# Patient Record
Sex: Female | Born: 1970
Health system: Southern US, Community
[De-identification: ages and names within clinical notes are randomized; demographics above are authoritative.]

## PROBLEM LIST (undated history)

## (undated) DIAGNOSIS — F319 Bipolar disorder, unspecified: Secondary | ICD-10-CM

## (undated) DIAGNOSIS — R112 Nausea with vomiting, unspecified: Secondary | ICD-10-CM

## (undated) DIAGNOSIS — E039 Hypothyroidism, unspecified: Secondary | ICD-10-CM

## (undated) DIAGNOSIS — J069 Acute upper respiratory infection, unspecified: Secondary | ICD-10-CM

## (undated) DIAGNOSIS — Z9889 Other specified postprocedural states: Secondary | ICD-10-CM

## (undated) DIAGNOSIS — F329 Major depressive disorder, single episode, unspecified: Secondary | ICD-10-CM

## (undated) DIAGNOSIS — Z5189 Encounter for other specified aftercare: Secondary | ICD-10-CM

## (undated) DIAGNOSIS — IMO0001 Reserved for inherently not codable concepts without codable children: Secondary | ICD-10-CM

## (undated) DIAGNOSIS — I10 Essential (primary) hypertension: Secondary | ICD-10-CM

## (undated) DIAGNOSIS — F419 Anxiety disorder, unspecified: Secondary | ICD-10-CM

## (undated) DIAGNOSIS — F32A Depression, unspecified: Secondary | ICD-10-CM

## (undated) DIAGNOSIS — T7840XA Allergy, unspecified, initial encounter: Secondary | ICD-10-CM

## (undated) DIAGNOSIS — L709 Acne, unspecified: Secondary | ICD-10-CM

## (undated) DIAGNOSIS — H02401 Unspecified ptosis of right eyelid: Secondary | ICD-10-CM

## (undated) DIAGNOSIS — E669 Obesity, unspecified: Secondary | ICD-10-CM

## (undated) DIAGNOSIS — F99 Mental disorder, not otherwise specified: Secondary | ICD-10-CM

## (undated) HISTORY — DX: Bipolar disorder, unspecified: F31.9

## (undated) HISTORY — DX: Unspecified ptosis of right eyelid: H02.401

## (undated) HISTORY — PX: FOOT FOREIGN BODY REMOVAL: SUR1116

## (undated) HISTORY — PX: THYROIDECTOMY, PARTIAL: SHX18

## (undated) HISTORY — DX: Reserved for inherently not codable concepts without codable children: IMO0001

## (undated) HISTORY — PX: KNEE ARTHROSCOPY: SUR90

## (undated) HISTORY — DX: Acne, unspecified: L70.9

## (undated) HISTORY — DX: Allergy, unspecified, initial encounter: T78.40XA

## (undated) HISTORY — PX: DIAGNOSTIC LAPAROSCOPY: SUR761

## (undated) HISTORY — DX: Hypothyroidism, unspecified: E03.9

## (undated) HISTORY — DX: Obesity, unspecified: E66.9

---
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
## 1993-01-07 DIAGNOSIS — J329 Chronic sinusitis, unspecified: Secondary | ICD-10-CM | POA: Insufficient documentation

## 1996-05-08 DIAGNOSIS — K589 Irritable bowel syndrome without diarrhea: Secondary | ICD-10-CM | POA: Insufficient documentation

## 1997-04-21 DIAGNOSIS — M549 Dorsalgia, unspecified: Secondary | ICD-10-CM | POA: Insufficient documentation

## 1997-06-09 ENCOUNTER — Other Ambulatory Visit: Admission: RE | Admit: 1997-06-09 | Discharge: 1997-06-09 | Payer: Self-pay | Admitting: Gynecology

## 1998-02-26 ENCOUNTER — Other Ambulatory Visit: Admission: RE | Admit: 1998-02-26 | Discharge: 1998-02-26 | Payer: Self-pay | Admitting: Gynecology

## 1998-03-20 ENCOUNTER — Emergency Department (HOSPITAL_COMMUNITY): Admission: EM | Admit: 1998-03-20 | Discharge: 1998-03-20 | Payer: Self-pay | Admitting: Emergency Medicine

## 1998-03-20 ENCOUNTER — Encounter: Payer: Self-pay | Admitting: Emergency Medicine

## 1998-11-19 ENCOUNTER — Encounter: Admission: RE | Admit: 1998-11-19 | Discharge: 1998-11-19 | Payer: Self-pay | Admitting: Family Medicine

## 1998-12-07 ENCOUNTER — Other Ambulatory Visit: Admission: RE | Admit: 1998-12-07 | Discharge: 1998-12-07 | Payer: Self-pay | Admitting: *Deleted

## 1998-12-09 ENCOUNTER — Inpatient Hospital Stay (HOSPITAL_COMMUNITY): Admission: AD | Admit: 1998-12-09 | Discharge: 1998-12-11 | Payer: Self-pay | Admitting: Psychiatry

## 1999-08-03 ENCOUNTER — Encounter (INDEPENDENT_AMBULATORY_CARE_PROVIDER_SITE_OTHER): Payer: Self-pay | Admitting: Specialist

## 1999-08-03 ENCOUNTER — Ambulatory Visit (HOSPITAL_COMMUNITY): Admission: RE | Admit: 1999-08-03 | Discharge: 1999-08-03 | Payer: Self-pay | Admitting: General Surgery

## 1999-09-07 ENCOUNTER — Emergency Department (HOSPITAL_COMMUNITY): Admission: EM | Admit: 1999-09-07 | Discharge: 1999-09-08 | Payer: Self-pay | Admitting: Emergency Medicine

## 1999-09-10 ENCOUNTER — Ambulatory Visit (HOSPITAL_COMMUNITY): Admission: RE | Admit: 1999-09-10 | Discharge: 1999-09-10 | Payer: Self-pay | Admitting: *Deleted

## 1999-09-10 ENCOUNTER — Encounter: Payer: Self-pay | Admitting: *Deleted

## 2000-09-12 ENCOUNTER — Other Ambulatory Visit: Admission: RE | Admit: 2000-09-12 | Discharge: 2000-09-12 | Payer: Self-pay | Admitting: Gynecology

## 2001-01-15 ENCOUNTER — Other Ambulatory Visit: Admission: RE | Admit: 2001-01-15 | Discharge: 2001-01-15 | Payer: Self-pay | Admitting: Gynecology

## 2001-08-14 ENCOUNTER — Other Ambulatory Visit: Admission: RE | Admit: 2001-08-14 | Discharge: 2001-08-14 | Payer: Self-pay | Admitting: Gynecology

## 2002-09-17 ENCOUNTER — Other Ambulatory Visit: Admission: RE | Admit: 2002-09-17 | Discharge: 2002-09-17 | Payer: Self-pay | Admitting: Gynecology

## 2004-03-31 ENCOUNTER — Other Ambulatory Visit: Admission: RE | Admit: 2004-03-31 | Discharge: 2004-03-31 | Payer: Self-pay | Admitting: Obstetrics & Gynecology

## 2004-08-26 ENCOUNTER — Inpatient Hospital Stay (HOSPITAL_COMMUNITY): Admission: RE | Admit: 2004-08-26 | Discharge: 2004-08-26 | Payer: Self-pay | Admitting: Obstetrics & Gynecology

## 2004-11-10 ENCOUNTER — Inpatient Hospital Stay (HOSPITAL_COMMUNITY): Admission: AD | Admit: 2004-11-10 | Discharge: 2004-11-12 | Payer: Self-pay | Admitting: Obstetrics and Gynecology

## 2005-02-11 ENCOUNTER — Encounter: Admission: RE | Admit: 2005-02-11 | Discharge: 2005-02-11 | Payer: Self-pay | Admitting: Family Medicine

## 2006-02-16 ENCOUNTER — Ambulatory Visit: Payer: Self-pay | Admitting: Family Medicine

## 2006-04-06 ENCOUNTER — Ambulatory Visit: Payer: Self-pay | Admitting: Family Medicine

## 2006-06-29 ENCOUNTER — Inpatient Hospital Stay (HOSPITAL_COMMUNITY): Admission: AD | Admit: 2006-06-29 | Discharge: 2006-06-29 | Payer: Self-pay | Admitting: Obstetrics and Gynecology

## 2006-08-15 ENCOUNTER — Inpatient Hospital Stay (HOSPITAL_COMMUNITY): Admission: AD | Admit: 2006-08-15 | Discharge: 2006-08-15 | Payer: Self-pay | Admitting: Obstetrics and Gynecology

## 2006-09-04 ENCOUNTER — Inpatient Hospital Stay (HOSPITAL_COMMUNITY): Admission: AD | Admit: 2006-09-04 | Discharge: 2006-09-05 | Payer: Self-pay | Admitting: Obstetrics and Gynecology

## 2006-09-07 ENCOUNTER — Inpatient Hospital Stay (HOSPITAL_COMMUNITY): Admission: AD | Admit: 2006-09-07 | Discharge: 2006-09-07 | Payer: Self-pay | Admitting: *Deleted

## 2006-09-08 ENCOUNTER — Inpatient Hospital Stay (HOSPITAL_COMMUNITY): Admission: AD | Admit: 2006-09-08 | Discharge: 2006-09-08 | Payer: Self-pay | Admitting: Obstetrics and Gynecology

## 2006-10-28 ENCOUNTER — Encounter (INDEPENDENT_AMBULATORY_CARE_PROVIDER_SITE_OTHER): Payer: Self-pay | Admitting: Obstetrics and Gynecology

## 2006-10-28 ENCOUNTER — Inpatient Hospital Stay (HOSPITAL_COMMUNITY): Admission: AD | Admit: 2006-10-28 | Discharge: 2006-10-30 | Payer: Self-pay | Admitting: Obstetrics and Gynecology

## 2006-11-01 ENCOUNTER — Ambulatory Visit: Payer: Self-pay | Admitting: Family Medicine

## 2006-11-27 ENCOUNTER — Ambulatory Visit: Payer: Self-pay | Admitting: Family Medicine

## 2007-02-16 ENCOUNTER — Ambulatory Visit: Payer: Self-pay | Admitting: Family Medicine

## 2007-02-21 ENCOUNTER — Ambulatory Visit: Payer: Self-pay | Admitting: Family Medicine

## 2007-03-27 ENCOUNTER — Ambulatory Visit: Payer: Self-pay | Admitting: Family Medicine

## 2007-03-28 ENCOUNTER — Ambulatory Visit: Payer: Self-pay | Admitting: Family Medicine

## 2008-01-24 ENCOUNTER — Ambulatory Visit: Payer: Self-pay | Admitting: Family Medicine

## 2008-03-05 ENCOUNTER — Ambulatory Visit: Payer: Self-pay | Admitting: Family Medicine

## 2008-03-17 ENCOUNTER — Ambulatory Visit: Payer: Self-pay | Admitting: Family Medicine

## 2008-08-21 ENCOUNTER — Ambulatory Visit: Payer: Self-pay | Admitting: Family Medicine

## 2008-11-19 ENCOUNTER — Ambulatory Visit: Payer: Self-pay | Admitting: Family Medicine

## 2008-11-19 ENCOUNTER — Observation Stay (HOSPITAL_COMMUNITY): Admission: EM | Admit: 2008-11-19 | Discharge: 2008-11-19 | Payer: Self-pay | Admitting: Emergency Medicine

## 2008-12-23 ENCOUNTER — Ambulatory Visit: Payer: Self-pay | Admitting: Family Medicine

## 2009-05-04 ENCOUNTER — Ambulatory Visit: Payer: Self-pay | Admitting: Family Medicine

## 2009-06-22 ENCOUNTER — Ambulatory Visit: Payer: Self-pay | Admitting: Family Medicine

## 2009-09-24 ENCOUNTER — Ambulatory Visit: Payer: Self-pay | Admitting: Family Medicine

## 2009-11-18 ENCOUNTER — Ambulatory Visit: Payer: Self-pay | Admitting: Family Medicine

## 2009-11-25 ENCOUNTER — Ambulatory Visit: Payer: Self-pay | Admitting: Family Medicine

## 2009-11-30 ENCOUNTER — Ambulatory Visit: Payer: Self-pay | Admitting: Family Medicine

## 2010-02-05 ENCOUNTER — Ambulatory Visit
Admission: RE | Admit: 2010-02-05 | Discharge: 2010-02-05 | Payer: Self-pay | Source: Home / Self Care | Attending: Family Medicine | Admitting: Family Medicine

## 2010-02-28 ENCOUNTER — Encounter: Payer: Self-pay | Admitting: Obstetrics and Gynecology

## 2010-05-13 LAB — COMPREHENSIVE METABOLIC PANEL
Alkaline Phosphatase: 85 U/L (ref 39–117)
BUN: 9 mg/dL (ref 6–23)
Glucose, Bld: 102 mg/dL — ABNORMAL HIGH (ref 70–99)
Potassium: 3.8 mEq/L (ref 3.5–5.1)
Total Protein: 7.3 g/dL (ref 6.0–8.3)

## 2010-05-13 LAB — APTT: aPTT: 30 seconds (ref 24–37)

## 2010-05-13 LAB — CBC
MCHC: 34.3 g/dL (ref 30.0–36.0)
MCV: 91.8 fL (ref 78.0–100.0)
Platelets: 272 10*3/uL (ref 150–400)
RDW: 12.5 % (ref 11.5–15.5)

## 2010-05-13 LAB — CK TOTAL AND CKMB (NOT AT ARMC): Total CK: 61 U/L (ref 7–177)

## 2010-05-13 LAB — POCT PREGNANCY, URINE

## 2010-05-13 LAB — MAGNESIUM: Magnesium: 2.1 mg/dL (ref 1.5–2.5)

## 2010-06-07 ENCOUNTER — Encounter: Payer: Self-pay | Admitting: Family Medicine

## 2010-06-07 DIAGNOSIS — Z79891 Long term (current) use of opiate analgesic: Secondary | ICD-10-CM

## 2010-06-07 DIAGNOSIS — N946 Dysmenorrhea, unspecified: Secondary | ICD-10-CM

## 2010-06-07 DIAGNOSIS — R413 Other amnesia: Secondary | ICD-10-CM

## 2010-06-07 DIAGNOSIS — J40 Bronchitis, not specified as acute or chronic: Secondary | ICD-10-CM

## 2010-06-07 DIAGNOSIS — M549 Dorsalgia, unspecified: Secondary | ICD-10-CM

## 2010-06-07 DIAGNOSIS — F329 Major depressive disorder, single episode, unspecified: Secondary | ICD-10-CM

## 2010-06-07 DIAGNOSIS — M9420 Chondromalacia, unspecified site: Secondary | ICD-10-CM

## 2010-06-07 DIAGNOSIS — L709 Acne, unspecified: Secondary | ICD-10-CM

## 2010-06-07 DIAGNOSIS — N390 Urinary tract infection, site not specified: Secondary | ICD-10-CM

## 2010-06-07 DIAGNOSIS — F32A Depression, unspecified: Secondary | ICD-10-CM

## 2010-06-07 DIAGNOSIS — K589 Irritable bowel syndrome without diarrhea: Secondary | ICD-10-CM

## 2010-06-07 DIAGNOSIS — J029 Acute pharyngitis, unspecified: Secondary | ICD-10-CM

## 2010-06-07 DIAGNOSIS — J329 Chronic sinusitis, unspecified: Secondary | ICD-10-CM

## 2010-06-25 NOTE — Op Note (Signed)
[facility]  Patient:    Yolanda Weaver, Yolanda Weaver                        MRN: [id number] Proc. Date: 08/03/99 Adm. Date:  [id number] Disc. Date: [id number] Attending:  Glenna Fellows Tappan                           Operative Report  PREOPERATIVE DIAGNOSIS:  Right thyroid nodule.  POSTOPERATIVE DIAGNOSIS:  Right thyroid nodule.  SURGICAL PROCEDURE:  Right thyroid lobectomy.  SURGEON:  Lorne Skeens. Hoxworth, M.D.  ASSISTANT:  Milus Mallick, M.D.  ANESTHESIA:  General.  BRIEF HISTORY:  The patient is a 40 year old white female who in October of last year had a thyroid nodule noted on physical exam.  Ultrasound revealed a 3.5 cm cystic and solid nodule in the right lobe of the thyroid.  Fine needle aspiration revealed Hurthle cells.  She did not desire thyroid suppression by her history.  Since that time, she has noticed progressive pressure symptoms and increased difficulty swallowing.  Exam has confirmed a 3.5 cm mass in the right lobe of the thyroid.  After discussion of surgical and nonsurgical options, she feels strongly she would like to proceed with thyroidectomy.  The nature of the procedure, its indications, and risks of bleeding, infection, permanent hoarseness, and hypercalcemia had been discussed and understood, and is now brought to the operating room for this procedure.  DESCRIPTION OF PROCEDURE:  The patient was brought to the operating room and placed in the supine position on the operating table, and general endotracheal anesthesia was induced.  Her neck was carefully positioned, extended, and the neck sterilely prepped and draped.  A transverse incision was made in the skin crease above the sternal notch and dissection carried down through the subcutaneous tissue and platysma.  Subplatysmal flaps were then raised superiorly to the thyroid cartilage, inferiorly to the sternal notch, and laterally to the sternocleidomastoid muscles.  A  Mahorner retractor was placed.  The strap muscles were divided longitudinally in the midline.  The anterior surface of the thyroid was exposed.  Using blunt dissection, the right lobe of the thyroid was mobilized away from the strap muscles which were retracted laterally.  The thyroid was further mobilized from lateral areolar attachments.  The middle thyroid vein was identified and divided between clamps and tied with 4-0 silk.  The superior pole was bluntly mobilized and dissected free, and then superior pole vessels were divided between clamps, and doubly tied with 2-0 silk ties.  Careful blunt dissection inferior laterally was used to identify the course of the recurrent laryngeal nerve which was protected.  Parathyroid glands were felt to be identified in the superior and inferior locations, and were protected.  Inferiorly, the parathyroid gland was dissected off its fairly close attachment of the thyroid capsule and retracted laterally and protected.  Staying on the thyroid capsule, small branches of the inferior thyroid artery were divided between clips, and the lateral thyroid gland mobilized away from the nerve.  In this area, the dissection was carried into the thyroid capsule.  As the gland was reflected up medially, the ________ was divided with cautery and the isthmus clamped and divided, and the specimen removed.  There was a partially cystic, partially solid nodule in the inferior lobe of the thyroid.  This was sent for frozen section and revealed a follicular lesion with no evidence of invasion.  The isthmus was suture ligated with 3-0 Vicryl.  The neck was thoroughly irrigated with saline and inspected for hemostasis which was complete.  The strap muscles were closed in the midline with interrupted 3-0 Vicryl.  The platysma was closed with interrupted 3-0 Vicryl and the skin closed with three staples and 1/2 inch Steri-Strips with Benzoin.  Sponge, needle, and  instrument counts were correct.  A dry sterile dressing applied and the patient taken to the recovery room in good condition. DD:  08/03/99 TD:  08/04/99 Job: [id number]

## 2010-06-25 NOTE — Discharge Summary (Signed)
Yolanda Weaver, Yolanda Weaver NO.:  [id number]   MEDICAL RECORD NO.:  [id number]          PATIENT TYPE:  INP   LOCATION:  9123                          FACILITY:  [facility]   PHYSICIAN:  Malva Limes, M.D.    DATE OF BIRTH:  1970-11-22   DATE OF ADMISSION:  11/10/2004  DATE OF DISCHARGE:  11/12/2004                                 DISCHARGE SUMMARY   FINAL DIAGNOSES:  1.  Intrauterine pregnancy at 39-1/7 week' gestation.  2.  Spontaneous rupture of membranes.  3.  Variable decelerations.  4.  Bipolar disorder.  5.  Hypothyroidism.   PROCEDURE:  Vacuum-assisted vaginal delivery.  Delivery performed by Miguel Aschoff, M.D.   COMPLICATIONS:  None.   This 40 year old G1, P0, presents at 17 weeks' gestation with spontaneous  rupture of membranes.  The patient had IV Pitocin and progressed to  complete.  At this point epidural was placed with good pain relief.  The  patient pushed for one hour and started developing some variable  decelerations.  At this point a vacuum extractor was applied using the  Mityvac, a short second stage of labor.  The patient had a vacuum-assisted  vaginal delivery of a 7 pound 12 pound female infant with Apgars of 8 and 9  with a nuchal cord x1 noted.  The patient did have a second degree perineal  laceration that was repaired.  The delivery went without complications.  The  patient's postpartum course was benign without significant fevers.  She was  felt ready for discharge on postpartum day #2.  She was sent home on a  regular diet, told to decrease her activity, told to continue her prenatal  vitamins, was given Percocet one to two every four hours as needed for her  pain, was told she could take Motrin up to 60 mg every six hours as needed  for pain.  The patient is currently under a psychiatrist's care for her  bipolar disorder and they have been discussing postpartum depression, and  she is aware of the symptoms.  She will follow up with the  psychiatrist as  needed.  She will also be seen in our office in four to six weeks.   LABS ON DISCHARGE:  The patient had a hemoglobin of 9.3, white blood cell  count 15.1, and the patient did receive  her RhoGAM before discharge.      Leilani Able, P.A.-C.    ______________________________  Malva Limes, M.D.    MB/MEDQ  D:  12/14/2004  T:  12/14/2004  Job:  [id number]

## 2010-07-01 ENCOUNTER — Encounter: Payer: Self-pay | Admitting: Family Medicine

## 2010-07-01 ENCOUNTER — Ambulatory Visit (INDEPENDENT_AMBULATORY_CARE_PROVIDER_SITE_OTHER): Payer: Managed Care, Other (non HMO) | Admitting: Family Medicine

## 2010-07-01 VITALS — BP 110/70 | HR 68 | Ht 68.4 in | Wt 185.0 lb

## 2010-07-01 DIAGNOSIS — J309 Allergic rhinitis, unspecified: Secondary | ICD-10-CM

## 2010-07-01 DIAGNOSIS — R609 Edema, unspecified: Secondary | ICD-10-CM

## 2010-07-01 DIAGNOSIS — Z0000 Encounter for general adult medical examination without abnormal findings: Secondary | ICD-10-CM

## 2010-07-01 DIAGNOSIS — E039 Hypothyroidism, unspecified: Secondary | ICD-10-CM

## 2010-07-01 DIAGNOSIS — F319 Bipolar disorder, unspecified: Secondary | ICD-10-CM

## 2010-07-01 DIAGNOSIS — G43909 Migraine, unspecified, not intractable, without status migrainosus: Secondary | ICD-10-CM | POA: Insufficient documentation

## 2010-07-01 LAB — POCT URINALYSIS DIPSTICK
Bilirubin, UA: NEGATIVE
Blood, UA: NEGATIVE
Glucose, UA: NEGATIVE
Leukocytes, UA: NEGATIVE
Nitrite, UA: NEGATIVE

## 2010-07-01 LAB — CBC WITH DIFFERENTIAL/PLATELET
Hemoglobin: 12.3 g/dL (ref 12.0–15.0)
Lymphocytes Relative: 17 % (ref 12–46)
Lymphs Abs: 1.3 10*3/uL (ref 0.7–4.0)
Monocytes Relative: 6 % (ref 3–12)
Neutro Abs: 6 10*3/uL (ref 1.7–7.7)
Neutrophils Relative %: 77 % (ref 43–77)
RBC: 4.12 MIL/uL (ref 3.87–5.11)
WBC: 7.8 10*3/uL (ref 4.0–10.5)

## 2010-07-01 LAB — LIPID PANEL
Cholesterol: 127 mg/dL (ref 0–200)
Total CHOL/HDL Ratio: 2.4 Ratio
Triglycerides: 50 mg/dL (ref <150)
VLDL: 10 mg/dL (ref 0–40)

## 2010-07-01 LAB — COMPREHENSIVE METABOLIC PANEL
AST: 15 U/L (ref 0–37)
Alkaline Phosphatase: 83 U/L (ref 39–117)
BUN: 9 mg/dL (ref 6–23)
Calcium: 9.5 mg/dL (ref 8.4–10.5)
Creat: 0.78 mg/dL (ref 0.40–1.20)
Glucose, Bld: 95 mg/dL (ref 70–99)

## 2010-07-01 MED ORDER — FROVATRIPTAN SUCCINATE 2.5 MG PO TABS: 2.5000 mg | ORAL_TABLET | ORAL | Status: DC | PRN

## 2010-07-01 NOTE — Patient Instructions (Signed)
See her gynecologist concerning the libido issue. Also talked to your psychiatrist concerning any medication that might possibly also help your headaches. Call you with the blood results.

## 2010-07-01 NOTE — Progress Notes (Signed)
  Subjective:    Patient ID: Yolanda Weaver, female    DOB: 1970-10-14, 40 y.o.   MRN: [id number]  HPI she is here for a complete examination. She does see her gynecologist regularly. She has had abnormal Paps in the past. She also complains of some leg swelling especially if she stands or sits for long periods of time. She has had some difficulty in the past with constipation however not at the present time. She also is been having some libido issues. She continues to have headaches and uses ibuprofen for them however does occasionally need something stronger. Frova usually works for her. She continues to be followed by her psychiatrist for her underlying bipolar disorder. Her social and family history is unchanged. Work tends to go fairly well. Her marriage is fairly stable.    Review of Systems  Constitutional: Positive for fatigue.  Eyes: Negative.   Respiratory: Negative.   Cardiovascular: Negative.   Genitourinary: Negative.   Neurological: Positive for headaches.  Hematological: Negative.        Objective:   Physical ExamBP 110/70  Pulse 68  Ht 5' 8.4" (1.737 m)  Wt 185 lb (83.915 kg)  BMI 27.80 kg/m2  General Appearance:    Alert, cooperative, no distress, appears stated age  Head:    Normocephalic, without obvious abnormality, atraumatic  Eyes:    PERRL, conjunctiva/corneas clear, EOM's intact, fundi    benign  Ears:    Normal TM's and external ear canals  Nose:   Nares normal, mucosa normal, no drainage or sinus   tenderness  Throat:   Lips, mucosa, and tongue normal; teeth and gums normal  Neck:   Supple, no lymphadenopathy;  thyroid:  no   enlargement/tenderness/nodules; no carotid   bruit or JVD  Back:    Spine nontender, no curvature, ROM normal, no CVA     tenderness  Lungs:     Clear to auscultation bilaterally without wheezes, rales or     ronchi; respirations unlabored  Chest Wall:    No tenderness or deformity   Heart:    Regular rate and rhythm, S1 and S2  normal, no murmur, rub   or gallop  Breast Exam:    Deferred to GYN  Abdomen:     Soft, non-tender, nondistended, normoactive bowel sounds,    no masses, no hepatosplenomegaly  Genitalia:    Deferred to GYN     Extremities:   No clubbing, cyanosis or edema  Pulses:   2+ and symmetric all extremities  Skin:   Skin color, texture, turgor normal, no rashes or lesions  Lymph nodes:   Cervical, supraclavicular, and axillary nodes normal  Neurologic:   CNII-XII intact, normal strength, sensation and gait; reflexes 2+ and symmetric throughout          Psych:   Normal mood, affect, hygiene and grooming.           Assessment & Plan:  See chronic problem list She is to followup with her gynecologist concerning her libido. I will call in Frova for her . I also recommended that she talk to her psychiatrist concerning any possible medication change to help both her bipolar disorder and headaches. Recommend she elevate her feet as much as possible. Routine blood screening.

## 2010-07-06 ENCOUNTER — Other Ambulatory Visit: Payer: Self-pay

## 2010-07-06 ENCOUNTER — Telehealth: Payer: Self-pay

## 2010-07-06 MED ORDER — LEVOTHYROXINE SODIUM 50 MCG PO TABS: 50.0000 ug | ORAL_TABLET | Freq: Every day | ORAL | Status: DC

## 2010-07-06 NOTE — Telephone Encounter (Signed)
Pt informed of labs all normal pt needs synthroid called in she is going to call me back and let me know what pharmacy

## 2010-07-13 ENCOUNTER — Ambulatory Visit (INDEPENDENT_AMBULATORY_CARE_PROVIDER_SITE_OTHER): Payer: Managed Care, Other (non HMO) | Admitting: Medical

## 2010-07-13 ENCOUNTER — Encounter: Payer: Self-pay | Admitting: Medical

## 2010-07-13 VITALS — BP 102/62 | HR 88 | Temp 98.7°F | Ht 67.0 in | Wt 182.0 lb

## 2010-07-13 DIAGNOSIS — J329 Chronic sinusitis, unspecified: Secondary | ICD-10-CM

## 2010-07-13 DIAGNOSIS — R05 Cough: Secondary | ICD-10-CM

## 2010-07-13 DIAGNOSIS — J029 Acute pharyngitis, unspecified: Secondary | ICD-10-CM

## 2010-07-13 DIAGNOSIS — R059 Cough, unspecified: Secondary | ICD-10-CM

## 2010-07-13 MED ORDER — AMOXICILLIN-POT CLAVULANATE 875-125 MG PO TABS: 1.0000 | ORAL_TABLET | Freq: Two times a day (BID) | ORAL | Status: AC

## 2010-07-13 NOTE — Patient Instructions (Signed)
Advised nasal saline flush, salt water gargles, over-the-counter ibuprofen for pain and fever, prescription for Augmentin twice a day for 10 days, call or return if not improving or worse.

## 2010-07-13 NOTE — Progress Notes (Signed)
2 or more times per year sick Subjective:     Yolanda Weaver is a 40 y.o. female who presents for evaluation of abrupt onset of illness over the last 24 hours. Associated symptoms include fever that is Subjective, chills, dry cough, headache, myalgias, nasal blockage, sinus and nasal congestion and sore throat. Onset of symptoms was 1 days ago, and have been rapidly worsening since that time. She is drinking plenty of fluids. She has had a recent close exposure to her parents who have both been sick. Her father was seen in the emergency department for COPD flare. Using nothing for her symptoms.  The following portions of the patient's history were reviewed and updated as appropriate: allergies, current medications, past family history, past medical history, past social history, past surgical history and problem list.  Past Medical History  Diagnosis Date  . Acne   . Contraception   . Bipolar 1 disorder   . Migraine   . Ptosis, right eyelid   . Allergy   . Hypothyroid     Review of Systems Constitutional:  positive anorexia, fatigue Dermatology: denies rash ENT: Positive ear pain, sinus pain, thick productive chunks being coughed up Cardiology: denies chest pain, palpitations Respiratory: Positive chest tightness, denies shortness of breath, wheezing,  Gastroenterology: Positive nausea, denies abdominal pain, vomiting, diarrhea Musculoskeletal: + myalgias, denies joint swelling, back pain, neck pain Ophthalmology: denies eye redness, itching, discharge    Objective:      Filed Vitals:   07/13/10 1110  BP: 102/62  Pulse: 88  Temp: 98.7 F (37.1 C)    General appearance: no distress, WD/WN, ill-appearing HEENT: normocephalic, conjunctiva/corneas normal, sclerae anicteric, nares patent, turbinates with erythema, left TM with mild erythema and retraction, right TM normal appearing, pharynx with moderate erythema, no exudate.  Oral cavity: MMM, no lesions  Neck: supple, no  lymphadenopathy, no thyromegaly Heart: RRR, normal S1, S2, no murmurs Lungs: CTA bilaterally, no wheezes, rhonchi, or rales Abdomen: +bs, soft, non tender, non distended, no masses, no hepatomegaly, no splenomegaly Musculoskeletal: non tender, no obvious deformity of extremities    Assessment:   Encounter Diagnoses  Name Primary?  . Pharyngitis Yes  . Cough   . Sinusitis      Plan:   Discussed symptomatic treatment including salt water gargles, warm fluids, rest, hydrate well, can use over-the-counter Tylenol or ibuprofen for throat pain, fever, or malaise.  Prescription for Augmentin twice a day for 10 days given. If worse or not improving within 2-3 days, call or return.

## 2010-07-20 ENCOUNTER — Telehealth: Payer: Self-pay | Admitting: Medical

## 2010-07-20 ENCOUNTER — Other Ambulatory Visit: Payer: Self-pay | Admitting: Medical

## 2010-07-20 MED ORDER — FLUCONAZOLE 150 MG PO TABS: 150.0000 mg | ORAL_TABLET | Freq: Once | ORAL | Status: AC

## 2010-07-20 NOTE — Telephone Encounter (Signed)
Diflucan sent

## 2010-07-21 NOTE — Telephone Encounter (Signed)
Left message on voice mail that rx was called in for her.  CM, LPN

## 2010-07-21 NOTE — Telephone Encounter (Signed)
HANDLED BY Ivor Messier

## 2010-08-19 ENCOUNTER — Other Ambulatory Visit: Payer: Self-pay | Admitting: Orthopaedic Surgery

## 2010-08-19 DIAGNOSIS — M545 Low back pain, unspecified: Secondary | ICD-10-CM

## 2010-08-26 ENCOUNTER — Ambulatory Visit
Admission: RE | Admit: 2010-08-26 | Discharge: 2010-08-26 | Disposition: A | Discharge status: Home / Self Care | Payer: Managed Care, Other (non HMO) | Source: Ambulatory Visit | Attending: Orthopaedic Surgery | Admitting: Orthopaedic Surgery

## 2010-08-26 DIAGNOSIS — M545 Low back pain, unspecified: Secondary | ICD-10-CM

## 2010-11-02 ENCOUNTER — Ambulatory Visit (INDEPENDENT_AMBULATORY_CARE_PROVIDER_SITE_OTHER): Payer: Managed Care, Other (non HMO) | Admitting: Family Medicine

## 2010-11-02 ENCOUNTER — Encounter: Payer: Self-pay | Admitting: Family Medicine

## 2010-11-02 VITALS — BP 120/80 | HR 89 | Wt 187.0 lb

## 2010-11-02 DIAGNOSIS — S91209A Unspecified open wound of unspecified toe(s) with damage to nail, initial encounter: Secondary | ICD-10-CM

## 2010-11-02 DIAGNOSIS — S91109A Unspecified open wound of unspecified toe(s) without damage to nail, initial encounter: Secondary | ICD-10-CM

## 2010-11-02 DIAGNOSIS — Z23 Encounter for immunization: Secondary | ICD-10-CM

## 2010-11-02 DIAGNOSIS — M549 Dorsalgia, unspecified: Secondary | ICD-10-CM

## 2010-11-02 DIAGNOSIS — Z79899 Other long term (current) drug therapy: Secondary | ICD-10-CM

## 2010-11-02 DIAGNOSIS — E039 Hypothyroidism, unspecified: Secondary | ICD-10-CM

## 2010-11-02 LAB — CBC WITH DIFFERENTIAL/PLATELET
Basophils Absolute: 0 10*3/uL (ref 0.0–0.1)
Eosinophils Relative: 1 % (ref 0–5)
HCT: 40.3 % (ref 36.0–46.0)
Lymphocytes Relative: 16 % (ref 12–46)
Lymphs Abs: 1.2 10*3/uL (ref 0.7–4.0)
MCV: 95.5 fL (ref 78.0–100.0)
Neutro Abs: 5.5 10*3/uL (ref 1.7–7.7)
Platelets: 327 10*3/uL (ref 150–400)
RBC: 4.22 MIL/uL (ref 3.87–5.11)
WBC: 7.2 10*3/uL (ref 4.0–10.5)

## 2010-11-02 LAB — COMPREHENSIVE METABOLIC PANEL
ALT: 9 U/L (ref 0–35)
AST: 11 U/L (ref 0–37)
Albumin: 4.4 g/dL (ref 3.5–5.2)
CO2: 24 mEq/L (ref 19–32)
Calcium: 9.3 mg/dL (ref 8.4–10.5)
Chloride: 107 mEq/L (ref 96–112)
Creat: 0.87 mg/dL (ref 0.50–1.10)
Potassium: 4.1 mEq/L (ref 3.5–5.3)
Sodium: 143 mEq/L (ref 135–145)
Total Protein: 7.1 g/dL (ref 6.0–8.3)

## 2010-11-02 LAB — LIPID PANEL: Cholesterol: 141 mg/dL (ref 0–200)

## 2010-11-02 LAB — TSH: TSH: 1.14 u[IU]/mL (ref 0.350–4.500)

## 2010-11-02 NOTE — Patient Instructions (Signed)
Keep the toenail in place. Keep it covered so we'll get knocked off. Temp it as needed

## 2010-11-02 NOTE — Progress Notes (Signed)
  Subjective:    Patient ID: Yolanda Weaver, female    DOB: Jul 06, 1970, 40 y.o.   MRN: [id number]  HPI She partially avulsed her left great toenail last night and is here for further evaluation of this. Review of her record also indicates she needs her thyroid function tested. She also does not have blood work in approximately 2 years. She recently and had difficulty with her back and is being followed by Dr. Ophelia Charter. She has had 2 epidurals still having pain.   Review of Systems     Objective:   Physical Exam Alert and in no distress. Left great toenail is now anatomically in place with some tenderness to palpation.      Assessment & Plan:  Partial avulsion of left great toenail Hypothyroid Back pain Routine blood work including TSH. Continue to followup with her orthopedic surgeon.

## 2010-11-03 ENCOUNTER — Telehealth: Payer: Self-pay | Admitting: *Deleted

## 2010-11-03 MED ORDER — LEVOTHYROXINE SODIUM 50 MCG PO TABS: 50.0000 ug | ORAL_TABLET | Freq: Every day | ORAL | Status: DC

## 2010-11-03 NOTE — Telephone Encounter (Addendum)
Message copied by Dorthula Perfect on Wed Nov 03, 2010  1:10 PM ------      Message from: Laureen Ochs F      Created: Wed Nov 03, 2010  9:13 AM       Blood work including thyroid looks good. Renew her thyroid medicine for a year   Pt notified of lab results and thyroid medication was sent in to pharmacy.  CM,LPN

## 2010-11-18 LAB — T4, FREE: Free T4: 0.98

## 2010-11-18 LAB — CBC
MCHC: 34.1
MCV: 87.4
Platelets: 284
Platelets: 292
RDW: 13.9
WBC: 10.5
WBC: 12.9 — ABNORMAL HIGH

## 2010-11-18 LAB — TYPE AND SCREEN: ABO/RH(D): B NEG

## 2010-11-18 LAB — TSH: TSH: 4.241

## 2010-11-18 LAB — RPR: RPR Ser Ql: NONREACTIVE

## 2010-11-22 LAB — CBC
HCT: 34.9 — ABNORMAL LOW
Hemoglobin: 11.9 — ABNORMAL LOW
MCHC: 34.2
MCV: 89.3
Platelets: 290
RBC: 3.91
RDW: 12.7
WBC: 12.5 — ABNORMAL HIGH

## 2010-11-22 LAB — TYPE AND SCREEN
ABO/RH(D): B NEG
Antibody Screen: POSITIVE
DAT, IgG: NEGATIVE

## 2010-11-22 LAB — PROTIME-INR
INR: 0.9
Prothrombin Time: 12.5

## 2010-11-22 LAB — APTT: aPTT: 28

## 2010-11-23 ENCOUNTER — Telehealth: Payer: Self-pay | Admitting: Family Medicine

## 2010-11-23 LAB — ANTIBODY TITER: Ab Titer: 64

## 2010-11-23 LAB — RH IMMUNE GLOBULIN WORKUP (NOT WOMEN'S HOSP): ABO/RH(D): B NEG

## 2010-11-23 MED ORDER — LAMOTRIGINE 200 MG PO TABS: 300.0000 mg | ORAL_TABLET | Freq: Every day | ORAL | Status: DC

## 2010-11-23 NOTE — Telephone Encounter (Signed)
Mail order Rx for generic Lamictal not here yet and she is out of medication, can you give local Rx to cover until they come in, like ?#6   [facility] (across from [facility])  Call patient if there is a problem, otherwise she will check pharmacy later today

## 2010-11-23 NOTE — Telephone Encounter (Signed)
Small prescription for Lamictal given until her mail order comes in

## 2010-12-15 ENCOUNTER — Other Ambulatory Visit: Payer: Self-pay | Admitting: Obstetrics and Gynecology

## 2011-01-13 ENCOUNTER — Telehealth: Payer: Self-pay | Admitting: Family Medicine

## 2011-01-13 MED ORDER — AMOXICILLIN-POT CLAVULANATE 875-125 MG PO TABS: 1.0000 | ORAL_TABLET | Freq: Two times a day (BID) | ORAL | Status: DC

## 2011-01-13 NOTE — Telephone Encounter (Signed)
Pt has sinus infection again wants Rx called in if possible. Not able to take time off from work to come in. Same symptoms she gets regularly, sick x 1 1/2 weeks. Please call pateint      [facility]

## 2011-01-13 NOTE — Telephone Encounter (Signed)
Augmentin called in for sinus infection. She does get these periodically and responds well to this.

## 2011-01-25 ENCOUNTER — Telehealth: Payer: Self-pay | Admitting: Internal Medicine

## 2011-01-25 MED ORDER — FLUCONAZOLE 150 MG PO TABS: 150.0000 mg | ORAL_TABLET | Freq: Once | ORAL | Status: AC

## 2011-01-25 NOTE — Telephone Encounter (Signed)
Diflucan called in for treatment of yeast vaginitis some antibiotics

## 2011-02-09 ENCOUNTER — Ambulatory Visit (INDEPENDENT_AMBULATORY_CARE_PROVIDER_SITE_OTHER): Payer: Managed Care, Other (non HMO) | Admitting: Family Medicine

## 2011-02-09 ENCOUNTER — Telehealth: Payer: Self-pay | Admitting: Family Medicine

## 2011-02-09 ENCOUNTER — Encounter: Payer: Self-pay | Admitting: Family Medicine

## 2011-02-09 VITALS — BP 130/90 | HR 70 | Wt 193.0 lb

## 2011-02-09 DIAGNOSIS — J209 Acute bronchitis, unspecified: Secondary | ICD-10-CM

## 2011-02-09 MED ORDER — AMOXICILLIN-POT CLAVULANATE 875-125 MG PO TABS: 1.0000 | ORAL_TABLET | Freq: Two times a day (BID) | ORAL | Status: AC

## 2011-02-09 MED ORDER — FLUCONAZOLE 150 MG PO TABS: 150.0000 mg | ORAL_TABLET | Freq: Once | ORAL | Status: AC

## 2011-02-09 NOTE — Progress Notes (Signed)
  Subjective:    Patient ID: Yolanda Weaver, female    DOB: 03/06/1970, 41 y.o.   MRN: [id number]  HPI She has a five-day history that started with sinus congestion, sore throat, postnasal drainage. She then developed a productive cough several days later. She is having more difficulty with shortness of breath today. No fever or chills. She did have an episode of sinus infection several weeks prior to this but states she got over it entirely. She does not smoke. Review of Systems      Objective:   Physical Exam alert and in no distress. Tympanic membranes and canals are normal. Throat is clear. Tonsils are normal. Neck is supple without adenopathy or thyromegaly. Cardiac exam shows a regular sinus rhythm without murmurs or gallops. Lungs are clear to auscultation. Chest x-ray shows no acute changes        Assessment & Plan:  Bronchitis. I will treat her with Augmentin. She will call if not completely back to normal

## 2011-02-09 NOTE — Patient Instructions (Signed)
Take all of the antibiotic and call me if not entirely back to normal at the end of the course.

## 2011-02-09 NOTE — Telephone Encounter (Signed)
Diflucan called in for treatment of possible vaginitis

## 2011-03-14 ENCOUNTER — Telehealth: Payer: Self-pay | Admitting: Family Medicine

## 2011-03-14 NOTE — Telephone Encounter (Signed)
Dr. Ophelia Charter is a good  Careers adviser

## 2011-03-15 ENCOUNTER — Encounter (HOSPITAL_COMMUNITY): Payer: Self-pay | Admitting: Pharmacy Technician

## 2011-03-15 NOTE — Telephone Encounter (Signed)
Pt informed

## 2011-03-16 ENCOUNTER — Other Ambulatory Visit (HOSPITAL_COMMUNITY): Payer: Self-pay | Admitting: Orthopaedic Surgery

## 2011-03-18 ENCOUNTER — Encounter (HOSPITAL_COMMUNITY)
Admission: RE | Admit: 2011-03-18 | Discharge: 2011-03-18 | Disposition: A | Discharge status: Home / Self Care | Payer: Managed Care, Other (non HMO) | Source: Ambulatory Visit | Attending: Orthopaedic Surgery | Admitting: Orthopaedic Surgery

## 2011-03-18 ENCOUNTER — Encounter (HOSPITAL_COMMUNITY): Payer: Self-pay

## 2011-03-18 DIAGNOSIS — Z01818 Encounter for other preprocedural examination: Secondary | ICD-10-CM | POA: Insufficient documentation

## 2011-03-18 DIAGNOSIS — Z01812 Encounter for preprocedural laboratory examination: Secondary | ICD-10-CM | POA: Insufficient documentation

## 2011-03-18 HISTORY — DX: Reserved for inherently not codable concepts without codable children: IMO0001

## 2011-03-18 HISTORY — DX: Other specified postprocedural states: R11.2

## 2011-03-18 HISTORY — DX: Encounter for other specified aftercare: Z51.89

## 2011-03-18 HISTORY — DX: Other specified postprocedural states: Z98.890

## 2011-03-18 LAB — CBC
HCT: 39.3 % (ref 36.0–46.0)
MCH: 30.7 pg (ref 26.0–34.0)
MCV: 90.1 fL (ref 78.0–100.0)
Platelets: 325 10*3/uL (ref 150–400)
RDW: 12.9 % (ref 11.5–15.5)
WBC: 8.9 10*3/uL (ref 4.0–10.5)

## 2011-03-18 LAB — BASIC METABOLIC PANEL
CO2: 30 mEq/L (ref 19–32)
Calcium: 9.9 mg/dL (ref 8.4–10.5)
Chloride: 103 mEq/L (ref 96–112)
Creatinine, Ser: 0.68 mg/dL (ref 0.50–1.10)
Glucose, Bld: 91 mg/dL (ref 70–99)

## 2011-03-18 LAB — URINALYSIS, ROUTINE W REFLEX MICROSCOPIC
Bilirubin Urine: NEGATIVE
Hgb urine dipstick: NEGATIVE
Ketones, ur: NEGATIVE mg/dL
Specific Gravity, Urine: 1.019 (ref 1.005–1.030)
Urobilinogen, UA: 0.2 mg/dL (ref 0.0–1.0)
pH: 7 (ref 5.0–8.0)

## 2011-03-18 LAB — URINE MICROSCOPIC-ADD ON

## 2011-03-18 LAB — SURGICAL PCR SCREEN: Staphylococcus aureus: NEGATIVE

## 2011-03-18 NOTE — Pre-Procedure Instructions (Signed)
20 KEM PARCHER  03/18/2011   Your procedure is scheduled on:  Wed, Feb 13 @ 1230  Report to [facility] at 1030 AM.  Call this number if you have problems the morning of surgery: [phone]   Remember:   Do not eat food:After Midnight.  May have clear liquids: up to 4 Hours before arrival.(until 6:30 am)  Clear liquids include soda, tea, black coffee, apple or grape juice, broth.  Take these medicines the morning of surgery with A SIP OF WATER: Asenapine,Lamictal,and Synthroid   Do not wear jewelry, make-up or nail polish.  Do not wear lotions, powders, or perfumes. You may wear deodorant.  Do not shave 48 hours prior to surgery.  Do not bring valuables to the hospital.  Contacts, dentures or bridgework may not be worn into surgery.  Leave suitcase in the car. After surgery it may be brought to your room.  For patients admitted to the hospital, checkout time is 11:00 AM the day of discharge.   Patients discharged the day of surgery will not be allowed to drive home.  Name and phone number of your driver:   Special Instructions: CHG Shower Use Special Wash: 1/2 bottle night before surgery and 1/2 bottle morning of surgery.   Please read over the following fact sheets that you were given: Pain Booklet, Coughing and Deep Breathing, MRSA Information and Surgical Site Infection Prevention

## 2011-03-22 MED ORDER — CEFAZOLIN SODIUM-DEXTROSE 2-3 GM-% IV SOLR: 2.0000 g | INTRAVENOUS | Status: DC

## 2011-03-23 ENCOUNTER — Encounter (HOSPITAL_COMMUNITY): Admission: RE | Payer: Self-pay | Source: Ambulatory Visit

## 2011-03-23 ENCOUNTER — Ambulatory Visit (HOSPITAL_COMMUNITY)
Admission: RE | Admit: 2011-03-23 | Payer: Managed Care, Other (non HMO) | Source: Ambulatory Visit | Admitting: Orthopaedic Surgery

## 2011-03-23 SURGERY — MICRODISCECTOMY LUMBAR LAMINECTOMY
Anesthesia: General | Laterality: Right

## 2011-03-25 ENCOUNTER — Telehealth: Payer: Self-pay | Admitting: Family Medicine

## 2011-03-25 ENCOUNTER — Other Ambulatory Visit: Payer: Self-pay | Admitting: Orthopaedic Surgery

## 2011-03-25 DIAGNOSIS — IMO0002 Reserved for concepts with insufficient information to code with codable children: Secondary | ICD-10-CM

## 2011-03-25 MED ORDER — ASENAPINE MALEATE 10 MG SL SUBL: 10.0000 mg | SUBLINGUAL_TABLET | Freq: Every day | SUBLINGUAL | Status: DC

## 2011-03-25 MED ORDER — LAMOTRIGINE 200 MG PO TABS: 300.0000 mg | ORAL_TABLET | Freq: Every day | ORAL | Status: DC

## 2011-03-25 NOTE — Telephone Encounter (Signed)
Please take care of this and give her a six-month supply

## 2011-03-25 NOTE — Telephone Encounter (Signed)
Sent med in 

## 2011-03-25 NOTE — Telephone Encounter (Signed)
Pt req refill on Saphris 10 mg bid  BLK   Which is black cherry flavor #90 she can't take the other flavor.  Also Generic Lemictal 300 mg 1 qd.  # 90  Of each med with [employer]

## 2011-03-25 NOTE — Telephone Encounter (Signed)
Sent med in per jcl 

## 2011-03-28 ENCOUNTER — Encounter (HOSPITAL_COMMUNITY): Payer: Self-pay | Admitting: Pharmacy Technician

## 2011-03-28 ENCOUNTER — Other Ambulatory Visit (HOSPITAL_COMMUNITY): Payer: Self-pay | Admitting: Orthopaedic Surgery

## 2011-03-29 ENCOUNTER — Telehealth: Payer: Self-pay | Admitting: Family Medicine

## 2011-03-29 MED ORDER — LAMOTRIGINE 200 MG PO TABS: 300.0000 mg | ORAL_TABLET | Freq: Every day | ORAL | Status: DC

## 2011-03-29 MED ORDER — ASENAPINE MALEATE 10 MG SL SUBL: 10.0000 mg | SUBLINGUAL_TABLET | Freq: Every day | SUBLINGUAL | Status: DC

## 2011-03-29 NOTE — Telephone Encounter (Signed)
Go ahead and fix this for her and verify it

## 2011-03-29 NOTE — Telephone Encounter (Signed)
Med . Sent in 

## 2011-03-29 NOTE — Telephone Encounter (Signed)
Med sent in for pt again

## 2011-03-31 ENCOUNTER — Ambulatory Visit (HOSPITAL_COMMUNITY)
Admission: RE | Admit: 2011-03-31 | Discharge: 2011-03-31 | Disposition: A | Discharge status: Home / Self Care | Payer: Managed Care, Other (non HMO) | Source: Ambulatory Visit | Attending: Orthopaedic Surgery | Admitting: Orthopaedic Surgery

## 2011-03-31 ENCOUNTER — Ambulatory Visit
Admission: RE | Admit: 2011-03-31 | Discharge: 2011-03-31 | Disposition: A | Discharge status: Home / Self Care | Payer: Managed Care, Other (non HMO) | Source: Ambulatory Visit | Attending: Orthopaedic Surgery | Admitting: Orthopaedic Surgery

## 2011-03-31 ENCOUNTER — Encounter (HOSPITAL_COMMUNITY): Payer: Self-pay

## 2011-03-31 ENCOUNTER — Other Ambulatory Visit: Payer: Self-pay

## 2011-03-31 ENCOUNTER — Encounter (HOSPITAL_COMMUNITY)
Admission: RE | Admit: 2011-03-31 | Discharge: 2011-03-31 | Disposition: A | Discharge status: Home / Self Care | Payer: Managed Care, Other (non HMO) | Source: Ambulatory Visit | Attending: Orthopaedic Surgery | Admitting: Orthopaedic Surgery

## 2011-03-31 DIAGNOSIS — Z01810 Encounter for preprocedural cardiovascular examination: Secondary | ICD-10-CM | POA: Insufficient documentation

## 2011-03-31 DIAGNOSIS — Z01818 Encounter for other preprocedural examination: Secondary | ICD-10-CM | POA: Insufficient documentation

## 2011-03-31 DIAGNOSIS — IMO0002 Reserved for concepts with insufficient information to code with codable children: Secondary | ICD-10-CM

## 2011-03-31 DIAGNOSIS — Z01812 Encounter for preprocedural laboratory examination: Secondary | ICD-10-CM | POA: Insufficient documentation

## 2011-03-31 HISTORY — DX: Acute upper respiratory infection, unspecified: J06.9

## 2011-03-31 HISTORY — DX: Mental disorder, not otherwise specified: F99

## 2011-03-31 LAB — COMPREHENSIVE METABOLIC PANEL
AST: 13 U/L (ref 0–37)
Albumin: 3.9 g/dL (ref 3.5–5.2)
Alkaline Phosphatase: 101 U/L (ref 39–117)
CO2: 26 mEq/L (ref 19–32)
Chloride: 105 mEq/L (ref 96–112)
GFR calc non Af Amer: >90 mL/min (ref >90)
Potassium: 4.1 mEq/L (ref 3.5–5.1)
Total Bilirubin: 0.3 mg/dL (ref 0.3–1.2)

## 2011-03-31 NOTE — Progress Notes (Signed)
Pt. Insists that she had CXR recently at Dr. Susann Givens & EKG, called for results.  CXR not on record in their office, EKG last done 2010, they will fax to [facility]. Pt. Reports that she has unusual bld. Type, issued [place] a form stating what bld. Type she is with last visit to PAT on 03/18/2011, reporting antibodies D,M& S  .  Call to [facility] in bld. Bank, no form on record, but its noted that she has record of the same at [facility].

## 2011-03-31 NOTE — Pre-Procedure Instructions (Signed)
20 Yolanda Weaver  03/31/2011   Your procedure is scheduled on:  04/08/2011  Report to [facility] at 5:30 AM.  Call this number if you have problems the morning of surgery: [phone]   Remember:   Do not eat food:After Midnight.  May have clear liquids: up to 4 Hours before arrival.  Clear liquids include soda, tea, black coffee, apple or grape juice, broth.  Take these medicines the morning of surgery with A SIP OF WATER: NOTHING   Do not wear jewelry, make-up or nail polish.  Do not wear lotions, powders, or perfumes. You may wear deodorant.  Do not shave 48 hours prior to surgery.  Do not bring valuables to the hospital.  Contacts, dentures or bridgework may not be worn into surgery.  Leave suitcase in the car. After surgery it may be brought to your room.  For patients admitted to the hospital, checkout time is 11:00 AM the day of discharge.   Patients discharged the day of surgery will not be allowed to drive home.  Name and phone number of your driver:    With SPOUSE  Special Instructions: CHG Shower Use Special Wash: 1/2 bottle night before surgery and 1/2 bottle morning of surgery.   Please read over the following fact sheets that you were given: Surgical Site Infection Prevention

## 2011-03-31 NOTE — Progress Notes (Signed)
Call to Dr. Jola Babinski office, spoke with [facility] & Tammy

## 2011-04-01 NOTE — Consult Note (Addendum)
Anesthesia:  Patient is a 41 year old female scheduled for a right L5-S1 microdiskectomy on 04/08/11.  History includes former smoker, ptosis (right), Bipolar disorder, migraines, partial thyroidectomy now with hypothyroidism, history of blood transfusion.  She initially had pre-operative labs on 03/18/11, but her surgery date was moved back.  Only a CMET was repeated at her PAT appointment yesterday, so she will need a repeat CBC and pregnancy test on the day of surgery since those results will be > 39 days old.  (Lab says they cannot add it to yesterday's labs.)  Her CBC on 2//8/13 was WNL.  CXR from 03/31/11 showed clear lungs with no new focal or acute cardiopulmonary abnormality noted.  On 11/19/08 during an ED evaluation for [facility]) she had a cardiac CT done that showed no significant extracardiac findings and right dominant coronary arterial system with no  significant coronary artery disease, normal systolic function. No  other cardiac abnormalities noted. EF > 55%.  EKG showed NSR, poor r wave progression.  It was not felt significantly changed from her 11/19/08 EKG.    Plan to proceed if day of surgery labs reasonable.

## 2011-04-01 NOTE — Progress Notes (Signed)
Rec'd ekg fr. Dr. Susann Givens, from 2010, will ask A. Zelenak,PAC to review.  Labs & CXR- reviewed by B. Chena Chohan,RN

## 2011-04-07 MED ORDER — CEFAZOLIN SODIUM-DEXTROSE 2-3 GM-% IV SOLR
2.0000 g | INTRAVENOUS | Status: AC
  Administered 2011-04-08: 2 g via INTRAVENOUS
  Filled 2011-04-07: qty 50

## 2011-04-08 ENCOUNTER — Ambulatory Visit (HOSPITAL_COMMUNITY)
Admission: RE | Admit: 2011-04-08 | Discharge: 2011-04-09 | Disposition: A | Discharge status: Home / Self Care | Payer: Managed Care, Other (non HMO) | Source: Ambulatory Visit | Attending: Orthopaedic Surgery | Admitting: Orthopaedic Surgery

## 2011-04-08 ENCOUNTER — Encounter (HOSPITAL_COMMUNITY)
Admission: RE | Disposition: A | Discharge status: Home / Self Care | Payer: Self-pay | Source: Ambulatory Visit | Attending: Orthopaedic Surgery

## 2011-04-08 ENCOUNTER — Encounter (HOSPITAL_COMMUNITY): Payer: Self-pay | Admitting: Vascular Surgery

## 2011-04-08 ENCOUNTER — Encounter (HOSPITAL_COMMUNITY): Payer: Self-pay | Admitting: *Deleted

## 2011-04-08 ENCOUNTER — Ambulatory Visit (HOSPITAL_COMMUNITY): Payer: Managed Care, Other (non HMO)

## 2011-04-08 ENCOUNTER — Ambulatory Visit (HOSPITAL_COMMUNITY): Payer: Managed Care, Other (non HMO) | Admitting: Vascular Surgery

## 2011-04-08 DIAGNOSIS — E039 Hypothyroidism, unspecified: Secondary | ICD-10-CM | POA: Insufficient documentation

## 2011-04-08 DIAGNOSIS — H02409 Unspecified ptosis of unspecified eyelid: Secondary | ICD-10-CM | POA: Insufficient documentation

## 2011-04-08 DIAGNOSIS — L708 Other acne: Secondary | ICD-10-CM | POA: Insufficient documentation

## 2011-04-08 DIAGNOSIS — M5126 Other intervertebral disc displacement, lumbar region: Secondary | ICD-10-CM | POA: Diagnosis present

## 2011-04-08 DIAGNOSIS — Z01818 Encounter for other preprocedural examination: Secondary | ICD-10-CM | POA: Insufficient documentation

## 2011-04-08 DIAGNOSIS — G43909 Migraine, unspecified, not intractable, without status migrainosus: Secondary | ICD-10-CM | POA: Insufficient documentation

## 2011-04-08 DIAGNOSIS — F319 Bipolar disorder, unspecified: Secondary | ICD-10-CM | POA: Insufficient documentation

## 2011-04-08 DIAGNOSIS — Z01810 Encounter for preprocedural cardiovascular examination: Secondary | ICD-10-CM | POA: Insufficient documentation

## 2011-04-08 HISTORY — PX: LUMBAR LAMINECTOMY: SHX95

## 2011-04-08 LAB — CBC
HCT: 39.3 % (ref 36.0–46.0)
Hemoglobin: 13.3 g/dL (ref 12.0–15.0)
MCHC: 33.8 g/dL (ref 30.0–36.0)
MCV: 90.8 fL (ref 78.0–100.0)

## 2011-04-08 SURGERY — MICRODISCECTOMY LUMBAR LAMINECTOMY
Anesthesia: General | Site: Back | Laterality: Right | Wound class: Clean

## 2011-04-08 MED ORDER — NEOSTIGMINE METHYLSULFATE 1 MG/ML IJ SOLN
INTRAMUSCULAR | Status: DC | PRN
  Administered 2011-04-08: 1 mg via INTRAVENOUS

## 2011-04-08 MED ORDER — DIPHENHYDRAMINE HCL 25 MG PO TABS
25.0000 mg | ORAL_TABLET | Freq: Two times a day (BID) | ORAL | Status: DC | PRN
  Filled 2011-04-08: qty 1

## 2011-04-08 MED ORDER — KETOROLAC TROMETHAMINE 30 MG/ML IJ SOLN
30.0000 mg | Freq: Four times a day (QID) | INTRAMUSCULAR | Status: DC
  Administered 2011-04-08 – 2011-04-09 (×4): 30 mg via INTRAVENOUS
  Filled 2011-04-08 (×6): qty 1

## 2011-04-08 MED ORDER — MIDAZOLAM HCL 5 MG/5ML IJ SOLN
INTRAMUSCULAR | Status: DC | PRN
  Administered 2011-04-08: 2 mg via INTRAVENOUS

## 2011-04-08 MED ORDER — ACETAMINOPHEN 650 MG RE SUPP: 650.0000 mg | RECTAL | Status: DC | PRN

## 2011-04-08 MED ORDER — METHOCARBAMOL 100 MG/ML IJ SOLN
500.0000 mg | Freq: Four times a day (QID) | INTRAMUSCULAR | Status: DC | PRN
  Filled 2011-04-08: qty 5

## 2011-04-08 MED ORDER — METHOCARBAMOL 500 MG PO TABS
500.0000 mg | ORAL_TABLET | Freq: Four times a day (QID) | ORAL | Status: DC | PRN
  Administered 2011-04-08: 500 mg via ORAL
  Filled 2011-04-08 (×2): qty 1

## 2011-04-08 MED ORDER — IBUPROFEN 800 MG PO TABS: 800.0000 mg | ORAL_TABLET | Freq: Three times a day (TID) | ORAL | Status: AC | PRN

## 2011-04-08 MED ORDER — TRAMADOL HCL 50 MG PO TABS: 50.0000 mg | ORAL_TABLET | Freq: Four times a day (QID) | ORAL | Status: DC | PRN

## 2011-04-08 MED ORDER — ONDANSETRON HCL 4 MG/2ML IJ SOLN: 4.0000 mg | Freq: Once | INTRAMUSCULAR | Status: DC | PRN

## 2011-04-08 MED ORDER — ONDANSETRON HCL 4 MG PO TABS: 4.0000 mg | ORAL_TABLET | Freq: Three times a day (TID) | ORAL | Status: AC | PRN

## 2011-04-08 MED ORDER — ASENAPINE MALEATE 10 MG SL SUBL: 10.0000 mg | SUBLINGUAL_TABLET | Freq: Every day | SUBLINGUAL | Status: DC

## 2011-04-08 MED ORDER — LAMOTRIGINE 150 MG PO TABS
300.0000 mg | ORAL_TABLET | Freq: Every day | ORAL | Status: DC
  Administered 2011-04-08: 300 mg via ORAL
  Filled 2011-04-08 (×2): qty 2

## 2011-04-08 MED ORDER — SENNOSIDES-DOCUSATE SODIUM 8.6-50 MG PO TABS: 1.0000 | ORAL_TABLET | Freq: Every evening | ORAL | Status: DC | PRN

## 2011-04-08 MED ORDER — GLYCOPYRROLATE 0.2 MG/ML IJ SOLN
INTRAMUSCULAR | Status: DC | PRN
  Administered 2011-04-08: 0.2 mg via INTRAVENOUS

## 2011-04-08 MED ORDER — SODIUM CHLORIDE 0.9 % IV SOLN: 250.0000 mL | INTRAVENOUS | Status: DC

## 2011-04-08 MED ORDER — LACTATED RINGERS IV SOLN
INTRAVENOUS | Status: DC | PRN
  Administered 2011-04-08 (×2): via INTRAVENOUS

## 2011-04-08 MED ORDER — LEVOTHYROXINE SODIUM 50 MCG PO TABS
50.0000 ug | ORAL_TABLET | Freq: Every day | ORAL | Status: DC
  Administered 2011-04-08: 50 ug via ORAL
  Filled 2011-04-08 (×2): qty 1

## 2011-04-08 MED ORDER — DOCUSATE SODIUM 100 MG PO CAPS
100.0000 mg | ORAL_CAPSULE | Freq: Two times a day (BID) | ORAL | Status: DC
  Administered 2011-04-08 – 2011-04-09 (×2): 100 mg via ORAL
  Filled 2011-04-08: qty 1

## 2011-04-08 MED ORDER — CEFAZOLIN SODIUM 1-5 GM-% IV SOLN
1.0000 g | Freq: Three times a day (TID) | INTRAVENOUS | Status: AC
  Administered 2011-04-08 – 2011-04-09 (×2): 1 g via INTRAVENOUS
  Filled 2011-04-08 (×2): qty 50

## 2011-04-08 MED ORDER — METHOCARBAMOL 500 MG PO TABS: 500.0000 mg | ORAL_TABLET | Freq: Four times a day (QID) | ORAL | Status: AC

## 2011-04-08 MED ORDER — 0.9 % SODIUM CHLORIDE (POUR BTL) OPTIME
TOPICAL | Status: DC | PRN
  Administered 2011-04-08: 1000 mL

## 2011-04-08 MED ORDER — HYDROMORPHONE HCL PF 1 MG/ML IJ SOLN
0.2500 mg | INTRAMUSCULAR | Status: DC | PRN
  Administered 2011-04-08 (×2): 0.5 mg via INTRAVENOUS

## 2011-04-08 MED ORDER — DEXAMETHASONE SODIUM PHOSPHATE 10 MG/ML IJ SOLN
INTRAMUSCULAR | Status: DC | PRN
  Administered 2011-04-08: 8 mg via INTRAVENOUS

## 2011-04-08 MED ORDER — SODIUM CHLORIDE 0.9 % IJ SOLN: 3.0000 mL | INTRAMUSCULAR | Status: DC | PRN

## 2011-04-08 MED ORDER — PHENOL 1.4 % MT LIQD: 1.0000 | OROMUCOSAL | Status: DC | PRN

## 2011-04-08 MED ORDER — ONDANSETRON HCL 4 MG/2ML IJ SOLN
INTRAMUSCULAR | Status: DC | PRN
  Administered 2011-04-08: 4 mg via INTRAVENOUS

## 2011-04-08 MED ORDER — MENTHOL 3 MG MT LOZG: 1.0000 | LOZENGE | OROMUCOSAL | Status: DC | PRN

## 2011-04-08 MED ORDER — SODIUM CHLORIDE 0.9 % IJ SOLN
3.0000 mL | Freq: Two times a day (BID) | INTRAMUSCULAR | Status: DC
  Administered 2011-04-09: 3 mL via INTRAVENOUS

## 2011-04-08 MED ORDER — ONDANSETRON HCL 4 MG/2ML IJ SOLN: 4.0000 mg | INTRAMUSCULAR | Status: DC | PRN

## 2011-04-08 MED ORDER — KETOROLAC TROMETHAMINE 30 MG/ML IJ SOLN
INTRAMUSCULAR | Status: AC
  Filled 2011-04-08: qty 1

## 2011-04-08 MED ORDER — ZOLPIDEM TARTRATE 10 MG PO TABS: 10.0000 mg | ORAL_TABLET | Freq: Every evening | ORAL | Status: DC | PRN

## 2011-04-08 MED ORDER — SUMATRIPTAN SUCCINATE 50 MG PO TABS
50.0000 mg | ORAL_TABLET | ORAL | Status: DC | PRN
  Filled 2011-04-08: qty 1

## 2011-04-08 MED ORDER — FENTANYL CITRATE 0.05 MG/ML IJ SOLN
INTRAMUSCULAR | Status: DC | PRN
  Administered 2011-04-08: 50 ug via INTRAVENOUS
  Administered 2011-04-08: 100 ug via INTRAVENOUS
  Administered 2011-04-08: 50 ug via INTRAVENOUS
  Administered 2011-04-08: 150 ug via INTRAVENOUS
  Administered 2011-04-08: 50 ug via INTRAVENOUS

## 2011-04-08 MED ORDER — SCOPOLAMINE 1 MG/3DAYS TD PT72
MEDICATED_PATCH | TRANSDERMAL | Status: DC | PRN
  Administered 2011-04-08: 1 via TRANSDERMAL

## 2011-04-08 MED ORDER — KCL IN DEXTROSE-NACL 20-5-0.45 MEQ/L-%-% IV SOLN
INTRAVENOUS | Status: DC
  Administered 2011-04-08 – 2011-04-09 (×2): via INTRAVENOUS
  Filled 2011-04-08 (×3): qty 1000

## 2011-04-08 MED ORDER — TRAMADOL HCL 50 MG PO TABS
50.0000 mg | ORAL_TABLET | Freq: Four times a day (QID) | ORAL | Status: DC
  Filled 2011-04-08: qty 1

## 2011-04-08 MED ORDER — HYDROMORPHONE HCL PF 1 MG/ML IJ SOLN
0.5000 mg | INTRAMUSCULAR | Status: DC | PRN
  Administered 2011-04-08: 1 mg via INTRAVENOUS
  Filled 2011-04-08: qty 1

## 2011-04-08 MED ORDER — PANTOPRAZOLE SODIUM 40 MG IV SOLR
40.0000 mg | Freq: Every day | INTRAVENOUS | Status: DC
  Administered 2011-04-08: 40 mg via INTRAVENOUS
  Filled 2011-04-08 (×2): qty 40

## 2011-04-08 MED ORDER — PROPOFOL 10 MG/ML IV EMUL
INTRAVENOUS | Status: DC | PRN
  Administered 2011-04-08: 160 mg via INTRAVENOUS

## 2011-04-08 MED ORDER — ROCURONIUM BROMIDE 100 MG/10ML IV SOLN
INTRAVENOUS | Status: DC | PRN
  Administered 2011-04-08: 40 mg via INTRAVENOUS

## 2011-04-08 SURGICAL SUPPLY — 54 items
APL SKNCLS STERI-STRIP NONHPOA (GAUZE/BANDAGES/DRESSINGS)
BENZOIN TINCTURE PRP APPL 2/3 (GAUZE/BANDAGES/DRESSINGS) ×1 IMPLANT
BUR ROUND FLUTED 4 SOFT TCH (BURR) ×1 IMPLANT
CLOSURE STERI STRIP 1/2 X4 (GAUZE/BANDAGES/DRESSINGS) ×1 IMPLANT
CLOTH BEACON ORANGE TIMEOUT ST (SAFETY) ×2 IMPLANT
CORDS BIPOLAR (ELECTRODE) ×2 IMPLANT
COVER SURGICAL LIGHT HANDLE (MISCELLANEOUS) ×2 IMPLANT
DRAPE MICROSCOPE LEICA (MISCELLANEOUS) ×2 IMPLANT
DRAPE PROXIMA HALF (DRAPES) ×5 IMPLANT
DRSG EMULSION OIL 3X3 NADH (GAUZE/BANDAGES/DRESSINGS) ×1 IMPLANT
DURAPREP 26ML APPLICATOR (WOUND CARE) ×2 IMPLANT
ELECT REM PT RETURN 9FT ADLT (ELECTROSURGICAL) ×2
ELECTRODE REM PT RTRN 9FT ADLT (ELECTROSURGICAL) ×1 IMPLANT
GLOVE BIOGEL PI IND STRL 6.5 (GLOVE) IMPLANT
GLOVE BIOGEL PI IND STRL 7.5 (GLOVE) ×1 IMPLANT
GLOVE BIOGEL PI IND STRL 8 (GLOVE) ×1 IMPLANT
GLOVE BIOGEL PI INDICATOR 6.5 (GLOVE) ×1
GLOVE BIOGEL PI INDICATOR 7.5 (GLOVE) ×1
GLOVE BIOGEL PI INDICATOR 8 (GLOVE) ×1
GLOVE ECLIPSE 7.0 STRL STRAW (GLOVE) ×2 IMPLANT
GLOVE ORTHO TXT STRL SZ7.5 (GLOVE) ×2 IMPLANT
GLOVE SS BIOGEL STRL SZ 6.5 (GLOVE) IMPLANT
GLOVE SUPERSENSE BIOGEL SZ 6.5 (GLOVE) ×1
GOWN PREVENTION PLUS LG XLONG (DISPOSABLE) IMPLANT
GOWN PREVENTION PLUS XLARGE (GOWN DISPOSABLE) ×1 IMPLANT
GOWN STRL NON-REIN LRG LVL3 (GOWN DISPOSABLE) ×5 IMPLANT
KIT BASIN OR (CUSTOM PROCEDURE TRAY) ×2 IMPLANT
KIT ROOM TURNOVER OR (KITS) ×2 IMPLANT
MANIFOLD NEPTUNE II (INSTRUMENTS) ×2 IMPLANT
NDL HYPO 25GX1X1/2 BEV (NEEDLE) ×1 IMPLANT
NDL SPNL 18GX3.5 QUINCKE PK (NEEDLE) ×1 IMPLANT
NDL SUT .5 MAYO 1.404X.05X (NEEDLE) ×1 IMPLANT
NEEDLE HYPO 25GX1X1/2 BEV (NEEDLE) ×2 IMPLANT
NEEDLE MAYO TAPER (NEEDLE)
NEEDLE SPNL 18GX3.5 QUINCKE PK (NEEDLE) ×2 IMPLANT
NS IRRIG 1000ML POUR BTL (IV SOLUTION) ×2 IMPLANT
PACK LAMINECTOMY ORTHO (CUSTOM PROCEDURE TRAY) ×2 IMPLANT
PAD ARMBOARD 7.5X6 YLW CONV (MISCELLANEOUS) ×4 IMPLANT
PATTIES SURGICAL .5 X.5 (GAUZE/BANDAGES/DRESSINGS) ×2 IMPLANT
PATTIES SURGICAL .75X.75 (GAUZE/BANDAGES/DRESSINGS) ×1 IMPLANT
SPONGE GAUZE 4X4 12PLY (GAUZE/BANDAGES/DRESSINGS) ×2 IMPLANT
STAPLER VISISTAT 35W (STAPLE) IMPLANT
STRIP CLOSURE SKIN 1/2X4 (GAUZE/BANDAGES/DRESSINGS) IMPLANT
SUT VIC AB 2-0 CT1 27 (SUTURE) ×2
SUT VIC AB 2-0 CT1 TAPERPNT 27 (SUTURE) ×1 IMPLANT
SUT VICRYL 0 TIES 12 18 (SUTURE) ×2 IMPLANT
SUT VICRYL 4-0 PS2 18IN ABS (SUTURE) ×1 IMPLANT
SUT VICRYL AB 2 0 TIES (SUTURE) ×2 IMPLANT
SYR 20ML ECCENTRIC (SYRINGE) IMPLANT
SYR CONTROL 10ML LL (SYRINGE) ×2 IMPLANT
TAPE CLOTH SURG 4X10 WHT LF (GAUZE/BANDAGES/DRESSINGS) ×1 IMPLANT
TOWEL OR 17X24 6PK STRL BLUE (TOWEL DISPOSABLE) ×2 IMPLANT
TOWEL OR 17X26 10 PK STRL BLUE (TOWEL DISPOSABLE) ×2 IMPLANT
WATER STERILE IRR 1000ML POUR (IV SOLUTION) ×1 IMPLANT

## 2011-04-08 NOTE — Discharge Instructions (Signed)
Herniated Disk The bones of your spinal column (vertebrae) protect your spinal cord and nerves that go into your arms and legs. The vertebrae are separated by disks that cushion the spinal column and put space between your vertebrae. This allows movement between the vertebrae, which allows you to bend, rotate, and move your body from side to side. Sometimes, the disks move out of place (herniate) or break open (rupture) from injury or strain. The most common area for a disk herniation is in the lower back (lumbar area). Sometimes herniation occurs in the neck (cervical) disks.  CAUSES  As we grow older, the strong, fibrous cords that connect the vertebrae and support and surround the disks (ligaments) start to weaken. A strain on the back may cause a break in the disk ligaments. RISK FACTORS Herniated disks occur most often in men who are aged 18 years to 35 years, usually after strenuous activity. Other risk factors include conditions present at birth (congenital) that affect the size of the lumbar spinal canal. Additionally, a narrowing of the areas where the nerves exit the spinal canal can occur as you age. SYMPTOMS  Symptoms of a herniated disk vary. You may have weakness in certain muscles. This weakness can include difficulty lifting your leg or arm, difficulty standing on your toes on one side, or difficulty squeezing tightly with one of your hands. You may have numbness. You may feel a mild tingling, dull ache, or a burning or pulsating pain. In some cases, the pain is severe enough that you are unable to move. The pain most often occurs on one side of the body. The pain often starts slowly. It may get worse:  After you sit or stand.   At night.   When you sneeze, cough, or laugh.   When you bend backwards or walk more than a few yards.  The pain, numbness, or weakness will often go away or improve a lot over a period of weeks to months. Herniated lumbar disk Symptoms of a herniated  lumbar disk may include sharp pain in one part of your leg, hip, or buttocks and numbness in other parts. You also may feel pain or numbness on the back of your calf or the top or sole of your foot. The same leg also may feel weak. Herniated cervical disk Symptoms of a herniated cervical disk may include pain when you move your neck, deep pain near or over your shoulder blade, or pain that moves to your upper arm, forearm, or fingers. DIAGNOSIS  To diagnose a herniated disk, your caregiver will perform a physical exam. Your caregiver also may perform diagnostic tests to see your disk or to test the reaction of your muscles and the function of your nerves. During the physical exam, your caregiver may ask you to:  Sit, stand, and walk. While you walk, your caregiver may ask you to try walking on your toes and then your heels.   Bend forward, backward, and sideways.   Raise your shoulders, elbow, wrist, and fingers and check your strength during these tasks.  Your caregiver will check for:  Numbness or loss of feeling.   Muscle reflexes, which may be slower or missing.   Muscle strength, which may be weaker.   Posture or the way your spine curves.  Diagnostic tests that may be done include:  A spinal X-ray exam to rule out other causes of back pain.   Magnetic resonance imaging (MRI) or computed tomography (CT) scan, which will show   if the herniated disk is pressing on your spinal canal.   Electromyography. This is sometimes used to identify the specific area of nerve involvement.  TREATMENT  Initial treatment for a herniated disk is a short period of rest with medicines for pain. Pain medicines can include nonsteroidal anti-inflammatory medicines (NSAIDs), muscle relaxants for back spasms, and (rarely) narcotic pain medicine for severe pain that does not respond to NSAID use. Bed rest is often limited to 1 or 2 days at the most because prolonged rest can delay recovery. When the herniation  involves the lower back, sitting should be avoided as much as possible because sitting increases pressure on the ruptured disk. Sometimes a soft neck collar will be prescribed for a few days to weeks to help support your neck in the case of a cervical herniation. Physical therapy is often prescribed for patients with disk disease. Physical therapists will teach you how to properly lift, dress, walk, and perform other activities. They will work on strengthening the muscles that help support your spine. In some cases, physical therapy alone is not enough to treat a herniated disk. Steroid injections along the involved nerve root may be needed to help control pain. The steroid is injected in the area of the herniated disk and helps by reducing swelling around the disk. Sometimes surgery is the best option to treat a herniated disk.  SEEK IMMEDIATE MEDICAL CARE IF:   You have numbness, tingling, weakness, or problems with the use of your arms or legs.   You have severe headaches that are not relieved with the use of medicines.   You notice a change in your bowel or bladder control.   You have increasing pain in any areas of your body.   You experience shortness of breath, dizziness, or fainting.  MAKE SURE YOU:   Understand these instructions.   Will watch your condition.   Will get help right away if you are not doing well or get worse.  Document Released: 01/22/2000 Document Revised: 10/06/2010 Document Reviewed: 08/27/2010 [employer] Patient Information 2012 [employer]. 

## 2011-04-08 NOTE — Preoperative (Signed)
Beta Blockers   Reason not to administer Beta Blockers:Not Applicable 

## 2011-04-08 NOTE — Anesthesia Postprocedure Evaluation (Signed)
  Anesthesia Post-op Note  Patient: Yolanda Weaver  Procedure(s) Performed: Procedure(s) (LRB): MICRODISCECTOMY LUMBAR LAMINECTOMY (Right)  Patient Location: PACU  Anesthesia Type: General  Level of Consciousness: awake, alert  and oriented  Airway and Oxygen Therapy: Patient Spontanous Breathing and Patient connected to nasal cannula oxygen  Post-op Pain: mild  Post-op Assessment: Post-op Vital signs reviewed and Patient's Cardiovascular Status Stable  Post-op Vital Signs: stable  Complications: No apparent anesthesia complications and Patient re-intubated

## 2011-04-08 NOTE — H&P (Signed)
Yolanda Weaver is an 41 y.o. female.   Chief Complaint: LBP and right leg pain with HNP L5-S1 on 6 mo   Increase size HNP L5-S1 HPI: failed ESI, PT , NSAIDS, muscle relaxants, times 6 mo with persistant pain and weakness  Past Medical History  Diagnosis Date  . Acne   . Contraception   . Bipolar 1 disorder   . Migraine   . Ptosis, right eyelid   . Allergy   . Hypothyroid   . PONV (postoperative nausea and vomiting)   . Blood transfusion     h/o tx for rh incompatibility during pregnancy. see note re significant antibody  . Mental disorder     bipolar  . Recurrent upper respiratory infection (URI)     reports she had bronchitis, treated /w antibiotics in past month    Past Surgical History  Procedure Date  . Knee arthroscopy     x3 r  . Diagnostic laparoscopy     evaluation for endometriosis  . Thyroidectomy, partial     Family History  Problem Relation Age of Onset  . COPD Father   . Anesthesia problems Neg Hx   . Hypotension Neg Hx   . Malignant hyperthermia Neg Hx   . Pseudochol deficiency Neg Hx    Social History:  reports that she quit smoking about 10 years ago. She has never used smokeless tobacco. She reports that she drinks about .6 ounces of alcohol per week. She reports that she does not use illicit drugs.  Allergies:  Allergies  Allergen Reactions  . Acetaminophen Nausea And Vomiting  . Hydrocodone Nausea And Vomiting  . Oxycodone Nausea And Vomiting  . Erythromycin Rash    Medications Prior to Admission  Medication Dose Route Frequency Provider Last Rate Last Dose  . ceFAZolin (ANCEF) IVPB 2 g/50 mL premix  2 g Intravenous 60 min Pre-Op Eldred Manges, MD      . DISCONTD: ceFAZolin (ANCEF) IVPB 2 g/50 mL premix  2 g Intravenous 60 min Pre-Op Eldred Manges, MD       Medications Prior to Admission  Medication Sig Dispense Refill  . diphenhydrAMINE (BENADRYL) 25 MG tablet Take 25 mg by mouth 2 (two) times daily as needed. For allergies      .  levothyroxine (SYNTHROID, LEVOTHROID) 50 MCG tablet Take 50 mcg by mouth at bedtime.      . frovatriptan (FROVA) 2.5 MG tablet Take 2.5 mg by mouth as needed. If recurs, may repeat after 2 hours. Max of 3 tabs in 24 hours.        Results for orders placed during the hospital encounter of 04/08/11 (from the past 48 hour(s))  CBC     Status: Normal   Collection Time   04/08/11  6:23 AM      Component Value Range Comment   WBC 7.8  4.0 - 10.5 (K/uL)    RBC 4.33  3.87 - 5.11 (MIL/uL)    Hemoglobin 13.3  12.0 - 15.0 (g/dL)    HCT 09.8  11.9 - 14.7 (%)    MCV 90.8  78.0 - 100.0 (fL)    MCH 30.7  26.0 - 34.0 (pg)    MCHC 33.8  30.0 - 36.0 (g/dL)    RDW 82.9  56.2 - 13.0 (%)    Platelets 304  150 - 400 (K/uL)    No results found.  Review of Systems  Constitutional: Negative.   HENT: Negative.   Eyes: Negative.  Respiratory: Negative.   Cardiovascular: Negative.   Gastrointestinal: Negative.   Genitourinary: Negative.   Musculoskeletal: Positive for back pain.       Knee arthroscopies  ,   Right leg pain and L5-S1 Hnp symptomatic times 74yr  Skin: Negative.   Psychiatric/Behavioral: Positive for depression.    Blood pressure 134/86, pulse 74, temperature 98.1 F (36.7 C), temperature source Oral, resp. rate 20, last menstrual period 03/17/2006, SpO2 96.00%. Physical Exam  Constitutional: She appears well-developed and well-nourished.  HENT:  Head: Normocephalic and atraumatic.  Eyes: Conjunctivae and EOM are normal. Pupils are equal, round, and reactive to light.  Neck: Normal range of motion.  Cardiovascular: Normal rate, regular rhythm and intact distal pulses.   Respiratory: Effort normal and breath sounds normal.  GI: Soft.  Musculoskeletal: She exhibits no edema and no tenderness.       Pos SLR right at 45 degrees, sciatic notch tenderness right,    Neurological: She is alert.  Skin: Skin is warm.  Psychiatric: She has a normal mood and affect.      Assessment/Plan L5-S1 HNP right with GS weakness failed conservative tx.   Mazen Marcin C 04/08/2011, 7:28 AM

## 2011-04-08 NOTE — Interval H&P Note (Signed)
History and Physical Interval Note:  04/08/2011 7:34 AM  Yolanda Weaver  has presented today for surgery, with the diagnosis of Right L5-S1 HNP  The various methods of treatment have been discussed with the patient and family. After consideration of risks, benefits and other options for treatment, the patient has consented to  Procedure(s) (LRB): MICRODISCECTOMY LUMBAR LAMINECTOMY (Right) as a surgical intervention .  The patients' history has been reviewed, patient examined, no change in status, stable for surgery.  I have reviewed the patients' chart and labs.  Questions were answered to the patient's satisfaction.     [name] C

## 2011-04-08 NOTE — Progress Notes (Signed)
Patient ID: Yolanda Weaver, female   DOB: May 11, 1970, 41 y.o.   MRN: [id number]   Probable discharge Saturday if stable.  Instructions and rx for zofran, robaxin,ibuprofen and tramadol on chart for discharge.  Pt with history of severe post op nausea and vomiting.  She reports that ibuprofen usually does not cause the nausea and vomiting that narcotics do.  Will monitor and adjust meds as necessary.

## 2011-04-08 NOTE — Transfer of Care (Signed)
Immediate Anesthesia Transfer of Care Note  Patient: Yolanda Weaver  Procedure(s) Performed: Procedure(s) (LRB): MICRODISCECTOMY LUMBAR LAMINECTOMY (Right)  Patient Location: PACU  Anesthesia Type: General  Level of Consciousness: awake, alert , oriented and patient cooperative  Airway & Oxygen Therapy: Patient Spontanous Breathing and Patient connected to nasal cannula oxygen  Post-op Assessment: Report given to PACU RN and Post -op Vital signs reviewed and stable  Post vital signs: Reviewed  Complications: No apparent anesthesia complications

## 2011-04-08 NOTE — Anesthesia Preprocedure Evaluation (Addendum)
Anesthesia Evaluation  Patient identified by MRN, date of birth, ID band Patient awake    Reviewed: Allergy & Precautions, H&P , NPO status , Patient's Chart, lab work & pertinent test results, reviewed documented beta blocker date and time   History of Anesthesia Complications (+) PONV  Airway Mallampati: II TM Distance: >3 FB Neck ROM: Full    Dental  (+) Teeth Intact and Dental Advisory Given   Pulmonary Recent URI ,  clear to auscultation        Cardiovascular Regular Normal    Neuro/Psych  Headaches, PSYCHIATRIC DISORDERS Bipolar Disorder    GI/Hepatic   Endo/Other  Hypothyroidism   Renal/GU      Musculoskeletal   Abdominal   Peds  Hematology   Anesthesia Other Findings   Reproductive/Obstetrics                         Anesthesia Physical Anesthesia Plan  ASA: II  Anesthesia Plan: General   Post-op Pain Management:    Induction: Intravenous  Airway Management Planned: Oral ETT  Additional Equipment:   Intra-op Plan:   Post-operative Plan:   Informed Consent: I have reviewed the patients History and Physical, chart, labs and discussed the procedure including the risks, benefits and alternatives for the proposed anesthesia with the patient or authorized representative who has indicated his/her understanding and acceptance.   Dental advisory given  Plan Discussed with: CRNA and Surgeon  Anesthesia Plan Comments: (Bipolar Anxiety  PLan GA )       Anesthesia Quick Evaluation

## 2011-04-08 NOTE — Brief Op Note (Cosign Needed Addendum)
04/08/2011  9:36 AM  PATIENT:  Yolanda Weaver  41 y.o. female  PRE-OPERATIVE DIAGNOSIS:  Right L5-S1 Herniated Nucleus Pulposus  POST-OPERATIVE DIAGNOSIS:  Right L5-S1 Herniated Nucleus Pulposus  PROCEDURE:  Procedure(s) (LRB): MICRODISCECTOMY LUMBAR LAMINECTOMY (Right)  SURGEON:  Surgeon(s) and Role:    * Eldred Manges, MD - Primary  PHYSICIAN ASSISTANT: Janeen Watson PAC   ASSISTANTS: none   ANESTHESIA:   general  EBL:  Total I/O In: 1300 [I.V.:1300] Out: 50 [Blood:50]  BLOOD ADMINISTERED:none  DRAINS: none   LOCAL MEDICATIONS USED:  NONE  SPECIMEN:  No Specimen  DISPOSITION OF SPECIMEN:  N/A  COUNTS:  YES  TOURNIQUET:  * No tourniquets in log *  DICTATION: .Note written in EPIC  PLAN OF CARE: Admit for overnight observation  PATIENT DISPOSITION:  PACU - hemodynamically stable.   Delay start of Pharmacological VTE agent (>24hrs) due to surgical blood loss or risk of bleeding: yes

## 2011-04-09 NOTE — Op Note (Signed)
[name], [name] NO.:  [id number]  MEDICAL RECORD NO.:  [id number]  LOCATION:  [facility]                         FACILITY:  [facility]  PHYSICIAN:  Yolanda Weaver, M.D.    DATE OF BIRTH:  October 06, 1970  DATE OF PROCEDURE:  04/08/2011 DATE OF DISCHARGE:                              OPERATIVE REPORT   PREOPERATIVE DIAGNOSIS:  Right L5-S1 herniated nucleus pulposus with radiculopathy.  POSTOP DIAGNOSIS:  Right L5-S1 herniated nucleus pulposus with free fragment.  PROCEDURE:  Right L5 hemilaminectomy and microdiskectomy.  Removal of HNP.  SURGEON:  Yolanda Weaver, M.D.  ASSISTANT:  Yolanda Weaver, P.A., medically necessary and present for the entire procedure.  EBL:  Less than 100 mL.  COMPLICATIONS:  None.  INDICATIONS:  This 41 year old female who has had problems for greater than a year with back pain, right leg pain.  She has had 2 MRI scans, multiple epidural steroid injections with persistent right leg pain, positive tension signs, and persistent weakness with MRI scan showing an enlarging HNP at the right L5-S1 in comparison to previous MRI from 6-9 months ago.  PROCEDURES PERFORMED:  After induction of general anesthesia, the patient had preoperative antibiotics, calf bumpers, placed prone on chest rolls, careful padding, anterior shoulder roll pads, pads over the ulnar nerve, 10/10 sheath, prepped with DuraPrep, area squared with towels, Betadine, Steri-Drape laminectomy sheet.  Needle localization was taken, three x-rays were taken.  Initial needle was little bit low, second was above the 5-1 disk space.  Incision was made, subperiosteal dissection.  Deep Taylor retractors placed laterally.  The patient had some scoliosis, some increased lordosis, and laminotomy was performed. Hemilaminectomy was performed.  The lamina 5 was short in its cephalad- caudad height.  There were overhanging facets which were trimmed back. Thick chunks of ligament were  removed.  The nerve root of L5 was tight and Penfield 4 was taken over the level of the disk space and confirmed with the final x-ray confirming this was appropriate level.  With operative microscope draped, microdissection fragments of disk were underneath the nerve root L5, some slightly cephalad to the disk and had pieces of the endplate attached to it.  It was scarred down from previous epidurals and longstanding disk herniation.  MRI scan showed protrusion of the disc; however, there were free fragments present intermixed with some pieces of bone which were inflated bone.  Pieces were teased from the midline and grasped with micropituitary __________ used to protect the dura and then removed.  Multiple chunks were delivered primarily using the ball-tip nerve hook to sweep them laterally and then remove them.  The disk space was entered with a 15 scalpel blade.  The space was so tight, would not allow micropituitary entry.  The sac from the disk herniation was removed and hockey stick was able to be swept after the dura 108 degrees sweep with no areas of compression undersurface of the dura, was able to be carefully palpated with no remaining fragments.  Foramina was enlarged, some epidural veins laterally coagulated with bipolar cautery.  Irrigation with saline solution followed by standard layered closure with 0 Vicryl sutures in the deep  fascia 2-0 on subcutaneous tissue, and then subcuticular Vicryl closure. Tincture of benzoin, Steri-Strips, and postop dressing.  The patient tolerated procedure well, was transferred to recovery room.     Yolanda Weaver, M.D.     MCY/MEDQ  D:  04/08/2011  T:  04/08/2011  Job:  [id number]

## 2011-04-09 NOTE — Discharge Summary (Signed)
Physician Discharge Summary  Patient ID: Yolanda Weaver MRN: [id number] DOB/AGE: 41-02-1970 40 y.o.  Admit date: 04/08/2011 Discharge date: 04/09/2011  Admission Diagnoses:  Principal Problem:  *HNP (herniated nucleus pulposus), lumbar   Discharge Diagnoses:  Same  Past Medical History  Diagnosis Date  . Acne   . Contraception   . Bipolar 1 disorder   . Migraine   . Ptosis, right eyelid   . Allergy   . Hypothyroid   . PONV (postoperative nausea and vomiting)   . Blood transfusion     h/o tx for rh incompatibility during pregnancy. see note re significant antibody  . Mental disorder     bipolar  . Recurrent upper respiratory infection (URI)     reports she had bronchitis, treated /w antibiotics in past month    Surgeries: Procedure(s): MICRODISCECTOMY LUMBAR LAMINECTOMY on 04/08/2011   Consultants:    Discharged Condition: Improved  Hospital Course: Yolanda Weaver is an 41 y.o. female who was admitted 04/08/2011 with a chief complaint of No chief complaint on file. , and found to have a diagnosis of HNP (herniated nucleus pulposus), lumbar.  They were brought to the operating room on 04/08/2011 and underwent the above named procedures.    They were given perioperative antibiotics:  Anti-infectives     Start     Dose/Rate Route Frequency Ordered Stop   04/08/11 1530   ceFAZolin (ANCEF) IVPB 1 g/50 mL premix        1 g 100 mL/hr over 30 Minutes Intravenous Every 8 hours 04/08/11 1337 04/09/11 0032   04/07/11 1404   ceFAZolin (ANCEF) IVPB 2 g/50 mL premix        2 g 100 mL/hr over 30 Minutes Intravenous 60 min pre-op 04/07/11 1404 04/08/11 0736        .  They were given sequential compression devices, early ambulation, and chemoprophylaxis for DVT prophylaxis. POD#! Dressing changed, incision dry no erythrema or purulence. No swelling.Legs with min EHL weakness. No leg pain or numbness. Voiding without difficulty.  They benefited maximally from their hospital stay and  there were no complications.    Recent vital signs:  Filed Vitals:   04/09/11 0608  BP: 100/57  Pulse: 76  Temp: 98.4 F (36.9 C)  Resp: 16    Recent laboratory studies:  Results for orders placed during the hospital encounter of 04/08/11  CBC      Component Value Range   WBC 7.8  4.0 - 10.5 (K/uL)   RBC 4.33  3.87 - 5.11 (MIL/uL)   Hemoglobin 13.3  12.0 - 15.0 (g/dL)   HCT 40.9  81.1 - 91.4 (%)   MCV 90.8  78.0 - 100.0 (fL)   MCH 30.7  26.0 - 34.0 (pg)   MCHC 33.8  30.0 - 36.0 (g/dL)   RDW 78.2  95.6 - 21.3 (%)   Platelets 304  150 - 400 (K/uL)  HCG, SERUM, QUALITATIVE      Component Value Range   Preg, Serum NEGATIVE  NEGATIVE     Discharge Medications:   Medication List  As of 04/09/2011 12:04 PM   TAKE these medications         Asenapine Maleate 10 MG Subl   Place 10 mg under the tongue at bedtime.      diphenhydrAMINE 25 MG tablet   Commonly known as: BENADRYL   Take 25 mg by mouth 2 (two) times daily as needed. For allergies      frovatriptan 2.5 MG  tablet   Commonly known as: FROVA   Take 2.5 mg by mouth as needed. If recurs, may repeat after 2 hours. Max of 3 tabs in 24 hours.      ibuprofen 800 MG tablet   Commonly known as: ADVIL,MOTRIN   Take 1 tablet (800 mg total) by mouth every 8 (eight) hours as needed for pain.      lamoTRIgine 200 MG tablet   Commonly known as: LAMICTAL   Take 300 mg by mouth at bedtime.      levothyroxine 50 MCG tablet   Commonly known as: SYNTHROID, LEVOTHROID   Take 50 mcg by mouth at bedtime.      methocarbamol 500 MG tablet   Commonly known as: ROBAXIN   Take 1 tablet (500 mg total) by mouth 4 (four) times daily.      ondansetron 4 MG tablet   Commonly known as: ZOFRAN   Take 1 tablet (4 mg total) by mouth every 8 (eight) hours as needed for nausea.            Diagnostic Studies: Dg Chest 2 View  03/31/2011  *RADIOLOGY REPORT*  Clinical Data: Preoperative evaluation for back surgery. No current problems.   CHEST - 2 VIEW  Comparison: 11/20/2011  Findings: Heart and mediastinal contours are within normal limits. The lung fields are clear with no focal infiltrates of congestive failure. No pleural fluid or significant peribronchial cuffing is seen.  Bony structures are intact. Surgical clips are noted in the right high paratracheal region.  IMPRESSION: Clear lungs with no new focal or acute cardiopulmonary abnormality noted.  Original Report Authenticated By: Bertha Stakes, M.D.   Dg Lumbar Spine 2-3 Views  04/08/2011  *RADIOLOGY REPORT*  Clinical Data: L5-S1 microdiskectomy.  LUMBAR SPINE - 2-3 VIEW  Comparison: 03/31/2011  Findings: 4 intraoperative lateral views are submitted.  The first is labeled 0800 hours and demonstrates a surgical device projecting posterior to the upper sacrum.  The second is labeled 0815 hours, and demonstrates a surgical device projecting posterior to the L4-L5 interspace.  The third is labeled 0820 hours and localizes the L5 vertebral body.  The fourth is labeled 0845 hours and localizes the L5-S1 intervertebral level.  A blunt surgical instrument projects over the upper L5 level.  IMPRESSION: Intraoperative localization of the lumbosacral junction.  Original Report Authenticated By: Consuello Bossier, M.D.   Mr Lumbar Spine Wo Contrast  04/01/2011  *RADIOLOGY REPORT*  Clinical Data: Chronic low back pain extending into right lower extremity worse recently.  Preoperative exam.  No history of cancer.  No prior surgery.  MRI LUMBAR SPINE WITHOUT CONTRAST  Technique:  Multiplanar and multiecho pulse sequences of the lumbar spine were obtained without intravenous contrast.  Comparison: 08/26/2010 MR.  Findings: Last fully open disc space is labeled L5-S1.  Present examination incorporates from the T11-12 disc space through the mid sacrum.  Conus upper L2 level.  Linear increased signal within the distal cord / conus probably related to artifact.  Axial images not obtained.  If there are  any symptoms of myelopathy, this can be evaluated with thoracic spine MR including axial imaging.  Paravertebral structures unremarkable.  T11-12:  Small Schmorl's node deformity.  Mild bulge.  Mild spinal stenosis.  T12-L1:  Small Schmorl's node deformity.  Minimal bulge.  L1-2:  Mild left facet joint degenerative changes with minimal impression left posterior lateral aspect of thecal sac.  L2-3:  Negative.  L3-4:  Minimal facet joint degenerative change greater  on the left.  L4-5:  Mild facet joint degenerative changes greater on the left.  L5-S1:  Disc degeneration with disc space narrowing and endplate reactive changes greater on the right.  Baseline bulge with superimposed moderate sized right posterior lateral disc protrusion with elevation of posterior longitudinal ligament and mass effect upon the right lateral aspect of the thecal sac and upper aspect of right S1 nerve root.  This has progressed slightly in size since prior exam.  Mild right foraminal and minimal left foraminal narrowing.  IMPRESSION: Increase in size of right posterior lateral L5-S1 disc protrusion and mass effect when compared to the prior exam as detailed above. No other significant change has occurred.  Please see above.  Original Report Authenticated By: Fuller Canada, M.D.    Disposition:   Discharge Orders    Future Orders Please Complete By Expires   Diet - low sodium heart healthy      Call MD / Call 911      Comments:   If you experience chest pain or shortness of breath, CALL 911 and be transported to the hospital emergency room.  If you develope a fever above 101 F, pus (white drainage) or increased drainage or redness at the wound, or calf pain, call your surgeon's office.   Constipation Prevention      Comments:   Drink plenty of fluids.  Prune juice may be helpful.  You may use a stool softener, such as Colace (over the counter) 100 mg twice a day.  Use MiraLax (over the counter) for constipation as needed.    Increase activity slowly as tolerated      Discharge instructions      Comments:   No bending, lifting or twisting.  Walk as tolerated.  Change dressing daily or as needed.  May shower on Sunday if no drainage.  Ice packs to back as needed for pain.   Driving restrictions      Comments:   No driving for 2 weeks   Lifting restrictions      Comments:   No lifting      Follow-up Information    Follow up with YATES,MARK C, MD. Schedule an appointment as soon as possible for a visit in 1 week.   Contact information:   [facility] [address] [place] [address] [phone]           Signed: Kerrin Champagne 04/09/2011, 12:04 PM

## 2011-04-09 NOTE — Progress Notes (Signed)
Subjective: 1 Day Post-Op Procedure(s) (LRB): MICRODISCECTOMY LUMBAR LAMINECTOMY (Right)  Patient reports pain as mild.    Objective:   VITALS:  Temp:  [97.8 F (36.6 C)-98.8 F (37.1 C)] 98.4 F (36.9 C) (03/02 7846) Pulse Rate:  [76-105] 76  (03/02 0608) Resp:  [11-18] 16  (03/02 0608) BP: (98-148)/(57-91) 100/57 mmHg (03/02 0608) SpO2:  [95 %-100 %] 98 % (03/02 9629)  Neurologically intact ABD soft Neurovascular intact Sensation intact distally Intact pulses distally Dorsiflexion/Plantar flexion intact Incision: scant drainage No cellulitis present   LABS  Basename 04/08/11 0623  HGB 13.3  WBC 7.8  PLT 304   No results found for this basename: NA:2,K:2,CL:2,CO2:2,BUN:2,CREATININE:2,GLUCOSE:2, in the last 72 hours No results found for this basename: LABPT:2,INR:2 in the last 72 hours   Assessment/Plan: 1 Day Post-Op Procedure(s) (LRB): MICRODISCECTOMY LUMBAR LAMINECTOMY (Right)  D/C IV fluids Discharge home with home health Yolanda Weaver to tylenol, opiates and tramadol. Yolanda Weaver Yolanda Weaver Yolanda Weaver 04/09/2011, 11:58 AM

## 2011-04-12 ENCOUNTER — Encounter (HOSPITAL_COMMUNITY): Payer: Self-pay | Admitting: Orthopaedic Surgery

## 2011-06-23 ENCOUNTER — Ambulatory Visit (INDEPENDENT_AMBULATORY_CARE_PROVIDER_SITE_OTHER): Payer: Managed Care, Other (non HMO) | Admitting: Family Medicine

## 2011-06-23 ENCOUNTER — Encounter: Payer: Self-pay | Admitting: Family Medicine

## 2011-06-23 VITALS — BP 130/80 | HR 80 | Temp 98.2°F | Wt 196.0 lb

## 2011-06-23 DIAGNOSIS — G43909 Migraine, unspecified, not intractable, without status migrainosus: Secondary | ICD-10-CM

## 2011-06-23 DIAGNOSIS — Z79899 Other long term (current) drug therapy: Secondary | ICD-10-CM

## 2011-06-23 DIAGNOSIS — R5383 Other fatigue: Secondary | ICD-10-CM

## 2011-06-23 DIAGNOSIS — R5381 Other malaise: Secondary | ICD-10-CM

## 2011-06-23 DIAGNOSIS — E039 Hypothyroidism, unspecified: Secondary | ICD-10-CM

## 2011-06-23 DIAGNOSIS — J019 Acute sinusitis, unspecified: Secondary | ICD-10-CM

## 2011-06-23 DIAGNOSIS — J309 Allergic rhinitis, unspecified: Secondary | ICD-10-CM

## 2011-06-23 MED ORDER — AMOXICILLIN-POT CLAVULANATE 875-125 MG PO TABS: 1.0000 | ORAL_TABLET | Freq: Two times a day (BID) | ORAL | Status: AC

## 2011-06-23 NOTE — Patient Instructions (Signed)
Call me at the end of the antibiotic if not totally back to normal.

## 2011-06-23 NOTE — Progress Notes (Signed)
  Subjective:    Patient ID: Yolanda Weaver, female    DOB: 1970-10-27, 41 y.o.   MRN: [id number]  HPI She has a ten-day history of sinus pressure, dry cough, rhinorrhea , hoarse voice, PND, headache, fatigue.Marland Kitchen No fever, chills, itchy watery eyes. She does not smoke. She does have underlying allergies and presently has been taking OTC Benadryl and ibuprofen. She is not having a difficulty with her migraine headaches. She continues on her psychotropic medications as well as Synthroid but still thinks it she is more fatigued than she should be. She is also concerned about possible low vitamin D. Review of Systems     Objective:   Physical Exam alert and in no distress. Tympanic membranes and canals are normal. Throat is clear. Tonsils are normal. Neck is supple without adenopathy or thyromegaly. Cardiac exam shows a regular sinus rhythm without murmurs or gallops. Lungs are clear to auscultation. Tender over maxillary sinuses especially on the right       Assessment & Plan:   1. Migraine headache   2. Allergic rhinitis   3. Sinusitis, acute   4. Fatigue   5. Hypothyroid   6. Encounter for long-term (current) use of other medications    vitamin D. level and TSH were ordered.  Will treat her with Augmentin. She is to call if any difficulties. Continue on present medications and treat migraine as needed

## 2011-08-01 ENCOUNTER — Telehealth: Payer: Self-pay | Admitting: Internal Medicine

## 2011-08-01 MED ORDER — LEVOTHYROXINE SODIUM 50 MCG PO TABS: 50.0000 ug | ORAL_TABLET | Freq: Every day | ORAL | Status: DC

## 2011-08-01 NOTE — Telephone Encounter (Signed)
Med sent in.

## 2011-11-23 ENCOUNTER — Ambulatory Visit (INDEPENDENT_AMBULATORY_CARE_PROVIDER_SITE_OTHER): Payer: Managed Care, Other (non HMO) | Admitting: Medical

## 2011-11-23 ENCOUNTER — Encounter: Payer: Self-pay | Admitting: Medical

## 2011-11-23 VITALS — BP 140/90 | HR 83 | Temp 98.1°F | Resp 16 | Wt 196.0 lb

## 2011-11-23 DIAGNOSIS — J029 Acute pharyngitis, unspecified: Secondary | ICD-10-CM

## 2011-11-23 DIAGNOSIS — J40 Bronchitis, not specified as acute or chronic: Secondary | ICD-10-CM

## 2011-11-23 MED ORDER — AMOXICILLIN-POT CLAVULANATE 875-125 MG PO TABS: 1.0000 | ORAL_TABLET | Freq: Two times a day (BID) | ORAL | Status: DC

## 2011-11-23 NOTE — Addendum Note (Signed)
Addended by: Leretha Dykes L on: 11/23/2011 12:44 PM   Modules accepted: Orders

## 2011-11-23 NOTE — Progress Notes (Signed)
Subjective: Here for illness.  Her son has been sick and here for eval today too.  She reports 5 day hx/o cough, congestion, ear pain, throat pain, entire right neck hurts, getting chunks of thick green brown sputum with cough, getting worse overall.  Sinuses feel clogged on the right.  Using alka seltzer with no improvement.  No other aggravating or relieving factors.  No other c/o.  The following portions of the patient's history were reviewed and updated as appropriate: allergies, current medications, past family history, past medical history, past social history, past surgical history and problem list.   Objective:   Filed Vitals:   11/23/11 1012  BP: 140/90  Pulse: 83  Temp: 98.1 F (36.7 C)  Resp: 16    General appearance: Alert, WD/WN, no distress, ill appearing                             Skin: warm, no rash, no diaphoresis                           Head: no sinus tenderness                            Eyes: conjunctiva normal, corneas clear, PERRLA                            Ears: pearly TMs, external ear canals normal                          Nose: septum midline, turbinates swollen, with erythema and clear discharge             Mouth/throat: MMM, tongue normal, moderate pharyngeal erythema                           Neck: supple, no adenopathy, no thyromegaly, nontender                          Heart: RRR, normal S1, S2, no murmurs                         Lungs: clear,no rhonchi, no wheezes, no rales                Extremities: no edema, nontender    Strep negative.  Assessment and Plan:   Encounter Diagnoses  Name Primary?  . Pharyngitis Yes  . Bronchitis    Prescription given today for Augmentin as below.  Discussed diagnosis and treatment of bronchitis.  Suggested symptomatic OTC remedies for cough and congestion.  Ibuprofen OTC for fever and malaise.  Call/return in 2-3 days if symptoms are worse or not improving.

## 2011-11-29 ENCOUNTER — Telehealth: Payer: Self-pay | Admitting: Medical

## 2011-11-29 ENCOUNTER — Other Ambulatory Visit: Payer: Self-pay | Admitting: Medical

## 2011-11-29 MED ORDER — FLUCONAZOLE 150 MG PO TABS: 150.0000 mg | ORAL_TABLET | Freq: Once | ORAL | Status: DC

## 2011-11-29 NOTE — Telephone Encounter (Signed)
rx sent

## 2011-12-09 ENCOUNTER — Telehealth: Payer: Self-pay | Admitting: Internal Medicine

## 2011-12-12 NOTE — Telephone Encounter (Signed)
done

## 2011-12-13 ENCOUNTER — Encounter: Payer: Self-pay | Admitting: Medical

## 2011-12-13 ENCOUNTER — Telehealth: Payer: Self-pay | Admitting: Medical

## 2011-12-13 ENCOUNTER — Ambulatory Visit (INDEPENDENT_AMBULATORY_CARE_PROVIDER_SITE_OTHER): Payer: Managed Care, Other (non HMO) | Admitting: Medical

## 2011-12-13 VITALS — BP 132/90 | HR 68 | Temp 98.6°F | Resp 16 | Wt 194.0 lb

## 2011-12-13 DIAGNOSIS — B029 Zoster without complications: Secondary | ICD-10-CM

## 2011-12-13 NOTE — Patient Instructions (Addendum)

## 2011-12-13 NOTE — Progress Notes (Signed)
Subjective: Here for c/o shingles.   Went to Urgent Care Sunday after she developed itching on right leg, burning pain in whole right upper leg, aching, chills, fever.  Diagnosed with shingles.  Had scratched the leg to the point of bruising.  The more she is on the leg moving around, the worse the pain gets.  Was prescribed valtrex per Urgent Care and is using OTC Aleve for pain and inflammation.  No prior similar hx/o shingles.  No recent exposure to chicken pox or shingles, but she has had chicken pox prior.  She did have recent respiratory illness in the last few weeks.  She does a lot of getting up and down at work, lots of standing, and she has missed work yesterday and today due to the pain and illness.       Past Medical History  Diagnosis Date  . Acne   . Contraception   . Bipolar 1 disorder   . Migraine   . Ptosis, right eyelid   . Allergy   . Hypothyroid   . PONV (postoperative nausea and vomiting)   . Blood transfusion     h/o tx for rh incompatibility during pregnancy. see note re significant antibody  . Mental disorder     bipolar  . Recurrent upper respiratory infection (URI)     reports she had bronchitis, treated /w antibiotics in past month   Review of Systems Constitutional: +fever, +chills, -sweats, -unexpected -weight change,+fatigue ENT: -runny nose, -ear pain, -sore throat Cardiology:  -chest pain, -palpitations, -edema Respiratory: -cough, -shortness of breath, -wheezing Gastroenterology: -abdominal pain, -nausea, -vomiting, -diarrhea, -constipation  Ophthalmology: -vision changes Urology: -dysuria, -difficulty urinating, -hematuria, -urinary frequency, -urgency Neurology: -headache, -weakness, -tingling, -numbness    Objective:   Physical Exam  Filed Vitals:   12/13/11 1105  BP: 132/90  Pulse: 68  Temp: 98.6 F (37 C)  Resp: 16    General appearance: alert, no distress, WD/WN Skin: right upper leg with superolateral portion of leg, middle anterior  leg, medial knee and upper medial thigh with 4 separate patches of erythema with contained numerous vesicular lesions.  All in L2/L3 dermatome.   MSK: tender along right upper leg generalized and particularly over the rash Neuro: normal sensation and strength of legs Pulses normal  Assessment and Plan :     Encounter Diagnosis  Name Primary?  . Shingles Yes   C/t Valtrex, Ibuprofen, rest, hydrate well.   discussed precautions, prevention of transmission, treatment, discussed possible complications.  Gave note for work.  If worse or not improving, call or return.  Follow-up prn. Given the pain, gave note for work out through Friday

## 2011-12-13 NOTE — Telephone Encounter (Signed)
PATIENT IS AWARE OF THE DOSE ON THE MEDICATION. CLS

## 2011-12-13 NOTE — Telephone Encounter (Signed)
pls call and advise that it should be Valtrex 1g three times daily for a week.  If she has 500mg  tablets, then take 2 of the 500mg  three times daily for a week.

## 2012-02-16 ENCOUNTER — Other Ambulatory Visit (INDEPENDENT_AMBULATORY_CARE_PROVIDER_SITE_OTHER): Payer: Managed Care, Other (non HMO)

## 2012-02-16 DIAGNOSIS — Z23 Encounter for immunization: Secondary | ICD-10-CM

## 2012-06-21 ENCOUNTER — Telehealth: Payer: Self-pay | Admitting: Internal Medicine

## 2012-06-21 MED ORDER — POLYMYXIN B-TRIMETHOPRIM 10000-0.1 UNIT/ML-% OP SOLN: 1.0000 [drp] | OPHTHALMIC | Status: DC

## 2012-06-21 NOTE — Telephone Encounter (Signed)
Pt states she has pink eye from her son that came and saw you the other day and would like a rx sent in as they are going out of town tomorrow. [facility]

## 2012-06-21 NOTE — Telephone Encounter (Signed)
Talked to Dr. Susann Givens and he said it was okay to send rx for them

## 2012-06-25 ENCOUNTER — Ambulatory Visit (INDEPENDENT_AMBULATORY_CARE_PROVIDER_SITE_OTHER): Payer: Managed Care, Other (non HMO) | Admitting: Family Medicine

## 2012-06-25 ENCOUNTER — Encounter: Payer: Self-pay | Admitting: Family Medicine

## 2012-06-25 VITALS — BP 140/90 | HR 70 | Wt 200.0 lb

## 2012-06-25 DIAGNOSIS — H109 Unspecified conjunctivitis: Secondary | ICD-10-CM

## 2012-06-25 MED ORDER — AMOXICILLIN 875 MG PO TABS: 875.0000 mg | ORAL_TABLET | Freq: Two times a day (BID) | ORAL | Status: DC

## 2012-06-25 NOTE — Progress Notes (Signed)
  Subjective:    Patient ID: Yolanda Weaver, female    DOB: 1970/10/29, 42 y.o.   MRN: [id number]  HPI She is here for consult concerning continued difficulty with redness and drainage from both eyes especially the left. She has been using ophthalmic drops without benefit.   Review of Systems     Objective:   Physical Exam Both conjunctiva are injected with left greater than right. No drainage noted. No preauricular nodes. TMs clear. Neck is supple without adenopathy. Throat is clear.       Assessment & Plan:  Conjunctivitis - Plan: amoxicillin (AMOXIL) 875 MG tablet since she has an erythromycin allergy, will give her Amoxil.

## 2012-07-19 ENCOUNTER — Telehealth: Payer: Self-pay | Admitting: Family Medicine

## 2012-07-19 MED ORDER — HYOSCYAMINE SULFATE 0.125 MG SL SUBL: 0.1250 mg | SUBLINGUAL_TABLET | SUBLINGUAL | Status: DC | PRN

## 2012-07-19 NOTE — Telephone Encounter (Signed)
Levsin called in for GI spasm

## 2012-08-28 IMAGING — CR DG LUMBAR SPINE 2-3V
1 series · 1 of 1 positions shown · non-contrast
Comparison: 03/31/2011

CLINICAL DATA: L5-S1 microdiskectomy.

LUMBAR SPINE - 2-3 VIEW

[view not recorded]
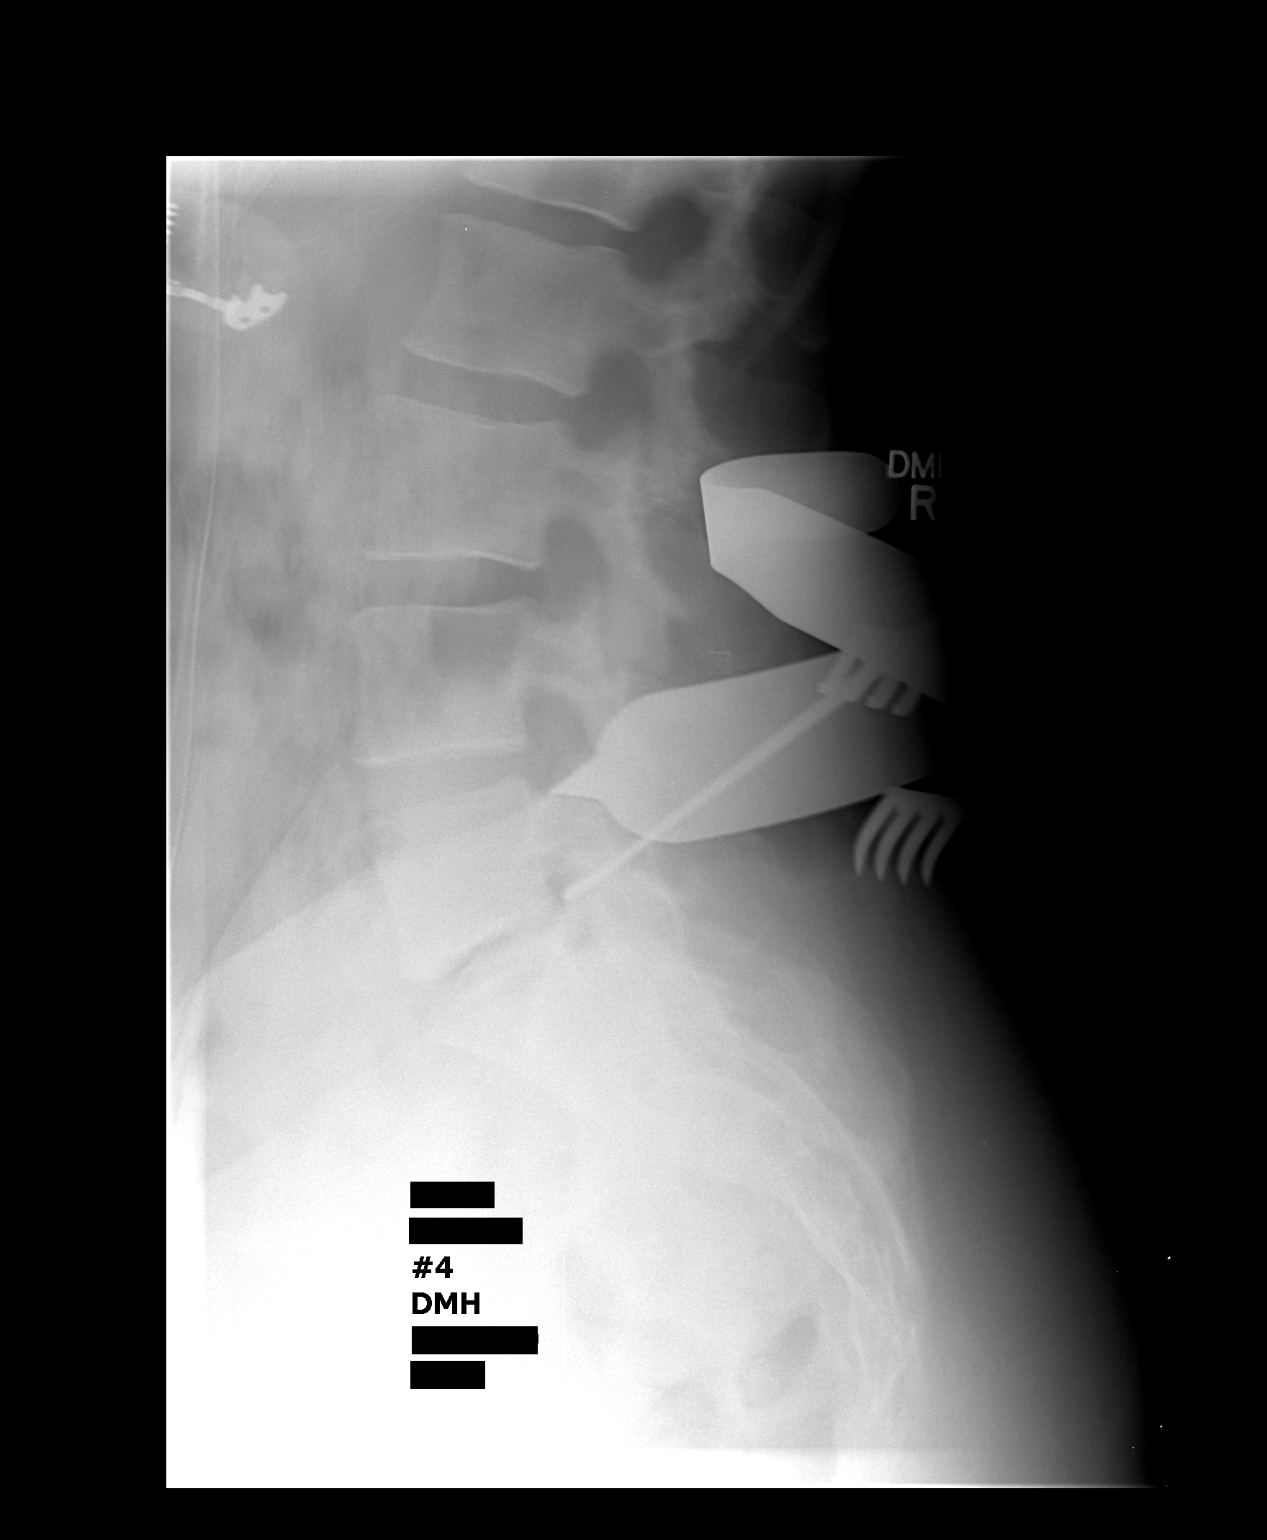

[1 of 1 positions shown; findings below may reference images not displayed]

FINDINGS: 4 intraoperative lateral views are submitted.  The first
is labeled 5555 hours and demonstrates a surgical device projecting
posterior to the upper sacrum.

The second is labeled 8612 hours, and demonstrates a surgical
device projecting posterior to the L4-L5 interspace.

The third is labeled 6196 hours and localizes the L5 vertebral
body.

The fourth is labeled 3607 hours and localizes the L5-S1
intervertebral level.  A blunt surgical instrument projects over
the upper L5 level.
IMPRESSION: Intraoperative localization of the lumbosacral junction.

## 2012-11-16 ENCOUNTER — Ambulatory Visit (INDEPENDENT_AMBULATORY_CARE_PROVIDER_SITE_OTHER): Payer: Managed Care, Other (non HMO)

## 2012-11-16 ENCOUNTER — Ambulatory Visit (INDEPENDENT_AMBULATORY_CARE_PROVIDER_SITE_OTHER): Payer: Managed Care, Other (non HMO) | Admitting: Podiatrist

## 2012-11-16 ENCOUNTER — Encounter: Payer: Self-pay | Admitting: Podiatrist

## 2012-11-16 VITALS — BP 131/80 | HR 74 | Resp 16 | Ht 66.0 in | Wt 195.0 lb

## 2012-11-16 DIAGNOSIS — Z9889 Other specified postprocedural states: Secondary | ICD-10-CM

## 2012-11-16 NOTE — Progress Notes (Signed)
Subjective:  Violetta presents today with her mom for her 1 month post of vist s/p [facility] bunion repair with screw fixation and removal of bone spur at hallux ip joint right foot.  Date of surgery 10/17/2012.  Patient states the foot continues to swell and be uncomfortable as she points to the first metatarsal region dorsally and plantarly.  She also relates numbness at the first interspace.  She is uncomfortable in her darco shoe and uses her cam walker when walking any distances or being out of her house.   Objective:  Neurovascular status intact to the right foot.  Swelling is present and patient relates pain at the first metatarsal phalangeal joint and numbness at the first interspace.  Vision site is well coapted. No redness no streaking or lymphangitis or signs of infection are present. Range of motion of the first metatarsophalangeal joint is improving with patient's exercises. And the patient states she's unable to wear a Darco shoe comfortably. Assessment: Status post [place] bunion repair screw fixation and removal bone spur hallux IP joint right foot date of surgery 10/17/2012 Plan: X-rays taken and reviewed with the patient at today's visit. Recommended continued range of motion exercises recommended continued ice and elevation. Because the patient is having swelling and cannot get into normal shoe he is still unable to drive. Patient continues to rehabilitate at home and will continue to need 8-12 weeks for this rehabilitation. We'll recheck her in one month in followup of her progress. If she continues to have swelling and pain physical therapy may be an option  Marlowe Aschoff DPM

## 2012-11-16 NOTE — Patient Instructions (Signed)
Continue to ice and elevate when you are able to help reduce swelling.   You may try to wear a regular athletic shoe at your comfort level.  Follow handout for bunion exercises and continue to do these daily.  Call if any ?'s arise

## 2012-11-28 ENCOUNTER — Other Ambulatory Visit (INDEPENDENT_AMBULATORY_CARE_PROVIDER_SITE_OTHER): Payer: Managed Care, Other (non HMO)

## 2012-11-28 DIAGNOSIS — Z23 Encounter for immunization: Secondary | ICD-10-CM

## 2012-12-14 ENCOUNTER — Ambulatory Visit (INDEPENDENT_AMBULATORY_CARE_PROVIDER_SITE_OTHER): Payer: Managed Care, Other (non HMO)

## 2012-12-14 ENCOUNTER — Encounter: Payer: Self-pay | Admitting: Podiatrist

## 2012-12-14 ENCOUNTER — Telehealth: Payer: Self-pay | Admitting: *Deleted

## 2012-12-14 ENCOUNTER — Ambulatory Visit (INDEPENDENT_AMBULATORY_CARE_PROVIDER_SITE_OTHER): Payer: Managed Care, Other (non HMO) | Admitting: Podiatrist

## 2012-12-14 VITALS — BP 148/91 | HR 75 | Resp 16 | Ht 66.0 in | Wt 190.0 lb

## 2012-12-14 DIAGNOSIS — Z9889 Other specified postprocedural states: Secondary | ICD-10-CM

## 2012-12-14 NOTE — Progress Notes (Signed)
  Subjective: Yolanda Weaver presents today for her 2 month postop followup status post [facility] bunion correction of the right foot. She states it still has shooting pain and it still swells that times.  She relates she was up sleeping her floor and she began having some significant pain in the foot. The pain do tend to resolve when resting. She's also tried standing on her foot and at this time she can only stand for a maximum of 3 hours at a time.  Objective: Neurovascular status is intact and unchanged. Excellent clinical appearance of the right foot is noted status post [facility] bunion correction. Range of motion is improving with 40 of dorsiflexion and 5 of plantar flexion noted. The patient is able to wear a normal sneaker as well. Incision site is well coapted and healing nicely  Assessment: 2 months status post [place] bunion correction right foot with screw fixation date of surgery 10/17/2012  Plan: Recommended plantarflexion exercises on the right great toe. At this time the patient is unable to stand the amount that she needs to be it would go back to work and therefore I recommended home physical therapy exercises and increasing her standing during the day up to 12 hours to prepare her for returning to work. I will see her back in one month for her final followup and will release her at that time. If she has any problems or concerns in the meantime she is instructed to call. She's also instructed to practice driving is well.

## 2012-12-14 NOTE — Telephone Encounter (Signed)
Pt states she has had time to think about PT and would like to begin.

## 2012-12-14 NOTE — Patient Instructions (Signed)
Bend your big toe down now as much as possible.  Practice standing for long periods of time to get ready to go back to work.  We will  Most likely have to release you to go back at the next visit.

## 2012-12-17 NOTE — Telephone Encounter (Signed)
Informed pt that Dr Irving Shows referred her to [facility] for physical therapy.  See scanned referral form.

## 2013-01-11 ENCOUNTER — Ambulatory Visit (INDEPENDENT_AMBULATORY_CARE_PROVIDER_SITE_OTHER): Payer: Managed Care, Other (non HMO) | Admitting: Podiatrist

## 2013-01-11 ENCOUNTER — Ambulatory Visit: Payer: Self-pay

## 2013-01-11 DIAGNOSIS — Z9889 Other specified postprocedural states: Secondary | ICD-10-CM

## 2013-01-11 NOTE — Progress Notes (Signed)
   Subjective:    Patient ID: Yolanda Weaver, female    DOB: 1970/07/24, 42 y.o.   MRN: [id number]  HPI Comments: '' RT FOOT STILL HURTING THIS WEEK'' [name] presents for her 3 month post op follow up status post [facility] bunion correction of the right foot.  Date of surgery 10/17/2012.  She has been participating in formal physical therapy at [facility] and states she has noticed some improvement but still not where she would like to be.  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      Review of Systems  unchanged     Objective:   Physical Exam Neurovascular status unchanged.  Clinical apearance continues to be excellent to the right foot.  Range of motion continues to improve with 45 degrees dorsiflexion, 10 degrees plantarflexion.  The patient subjectively complains she can't flex her toes down any longer as she was able to prior to surgery.  Incision site healed and minimal swelling is present to the right foot.     Assessment & Plan:  3 months status post bunion repair right foot-    Plan:  Recommended Physical therapy for 6 more weeks- wrote a note to return to work at decreased work hours 6 hrs for 2 weeks, 8 hrs for 2 weeks then may resume 12 hr shifts-  Must be able to wear a running sneaker.at work.  I will see her back in 6 weeks for a final check.  At this point, I feel her foot is ready to return to work and she needs to do this to get full strength back into her foot.  Marlowe Aschoff, DPM

## 2013-02-13 ENCOUNTER — Other Ambulatory Visit: Payer: Self-pay | Admitting: Podiatrist

## 2013-02-13 ENCOUNTER — Telehealth: Payer: Self-pay | Admitting: *Deleted

## 2013-02-13 NOTE — Telephone Encounter (Addendum)
Pt states she has some questions about the wearing athletic shoes at work and having more swelling and a sharp that shoots up the toe.  I told pt I would write her a note for 6 more months in athletic shoes, but if continued swelling at 6 months she should come in sooner.  I also offered a earlier appt for the sharp pain, but she said she'd wait a week or two.  Mailed copy of note recommending pt wear athletic style shoes to pt's home address and faxed to Christie NottinghamGloria Thomas [phone].

## 2013-02-14 ENCOUNTER — Telehealth: Payer: Self-pay | Admitting: *Deleted

## 2013-02-14 ENCOUNTER — Encounter: Payer: Self-pay | Admitting: *Deleted

## 2013-02-14 NOTE — Telephone Encounter (Signed)
Faxed note to wear athletic shoes to work Christie NottinghamGloria Thomas [phone].

## 2013-02-15 ENCOUNTER — Telehealth: Payer: Self-pay | Admitting: *Deleted

## 2013-02-15 NOTE — Telephone Encounter (Signed)
Sent copy of note to pt, to wear athletic shoes to work.  Joya SanValery

## 2013-02-22 ENCOUNTER — Other Ambulatory Visit: Payer: Self-pay | Admitting: Obstetrics and Gynecology

## 2013-07-09 ENCOUNTER — Ambulatory Visit (INDEPENDENT_AMBULATORY_CARE_PROVIDER_SITE_OTHER): Payer: Managed Care, Other (non HMO) | Admitting: Family Medicine

## 2013-07-09 ENCOUNTER — Encounter: Payer: Self-pay | Admitting: Family Medicine

## 2013-07-09 VITALS — BP 110/90 | HR 60 | Ht 66.0 in | Wt 197.0 lb

## 2013-07-09 DIAGNOSIS — K3189 Other diseases of stomach and duodenum: Secondary | ICD-10-CM

## 2013-07-09 DIAGNOSIS — E669 Obesity, unspecified: Secondary | ICD-10-CM

## 2013-07-09 DIAGNOSIS — L709 Acne, unspecified: Secondary | ICD-10-CM

## 2013-07-09 DIAGNOSIS — L989 Disorder of the skin and subcutaneous tissue, unspecified: Secondary | ICD-10-CM

## 2013-07-09 DIAGNOSIS — K319 Disease of stomach and duodenum, unspecified: Secondary | ICD-10-CM

## 2013-07-09 DIAGNOSIS — L708 Other acne: Secondary | ICD-10-CM

## 2013-07-09 MED ORDER — HYOSCYAMINE SULFATE 0.125 MG SL SUBL: 0.1250 mg | SUBLINGUAL_TABLET | SUBLINGUAL | Status: DC | PRN

## 2013-07-09 MED ORDER — CLINDAMYCIN PHOSPHATE 1 % EX LOTN: TOPICAL_LOTION | Freq: Two times a day (BID) | CUTANEOUS | Status: DC

## 2013-07-09 NOTE — Progress Notes (Signed)
   Subjective:    Patient ID: Yolanda Weaver, female    DOB: November 23, 1970, 43 y.o.   MRN: [id number]  HPI She has 2 skin lesions, one on her back and one on her right medial arm that she would like evaluated. She also is had difficulty with acne and would like help with that. She would like a refill on her Levsin which she uses intermittently. She also has been exercising regularly and has made some dietary changes but states she has reached a plateau with her weight loss.   Review of Systems     Objective:   Physical Exam Alert and in no distress. Exam of her face was difficult due to makeup. She did have multiple follicular type lesions present on her face. Back exam only had one lesion.       Assessment & Plan:  Skin lesions  Adult acne - Plan: clindamycin (CLEOCIN-T) 1 % lotion  Obesity (BMI 30-39.9) - Plan: Amb ref to Medical Nutrition Therapy-MNT  Spasm of GI tract - Plan: hyoscyamine (LEVSIN/SL) 0.125 MG SL tablet  I will start her out with Cleocin. We'll hold off giving oral antibiotics at this time. She seems to be exercising regularly. I will refer her to nutritionist to help with dietary modification and may consider a weight loss medication at a later date.

## 2013-07-31 ENCOUNTER — Other Ambulatory Visit: Payer: Self-pay | Admitting: Family Medicine

## 2013-07-31 DIAGNOSIS — E669 Obesity, unspecified: Secondary | ICD-10-CM

## 2013-09-10 ENCOUNTER — Ambulatory Visit: Payer: Managed Care, Other (non HMO) | Admitting: Family Medicine

## 2013-09-11 ENCOUNTER — Encounter: Payer: Self-pay | Admitting: Dietician

## 2013-09-11 ENCOUNTER — Encounter: Payer: Managed Care, Other (non HMO) | Attending: Family Medicine | Admitting: Dietician

## 2013-09-11 VITALS — Ht 67.0 in | Wt 192.6 lb

## 2013-09-11 DIAGNOSIS — Z713 Dietary counseling and surveillance: Secondary | ICD-10-CM | POA: Diagnosis present

## 2013-09-11 DIAGNOSIS — E669 Obesity, unspecified: Secondary | ICD-10-CM | POA: Insufficient documentation

## 2013-09-11 DIAGNOSIS — Z6830 Body mass index (BMI) 30.0-30.9, adult: Secondary | ICD-10-CM | POA: Diagnosis not present

## 2013-09-11 NOTE — Patient Instructions (Signed)
Plan: 1) Try to eat breakfast everyday  2) Include protein and carbohydrate at meals and snacks 3) Consider adding vegetables to lunch 4) Resume walking when weather is more conducive - great job being active otherwise 5) Continue to drink water instead of soda

## 2013-09-11 NOTE — Progress Notes (Signed)
  Medical Nutrition Therapy:  Appt start time: 1415 end time:  1445.   Assessment:  Primary concerns today: Obesity.  Patient does not have excessive fat or sugar intake. Patient does skip breakfast and diet is inadequate in fruits and vegetables based on patient usual intake report-patient reports being tired and this may contribute. Patient lost weight on own with walking prior to foot surgery - weighed 180 lbs, and regained weight with inactivity. Patient has begun losing weight again now that activity is restored.  Recently stopped drinking regular soda a few weeks ago - 2, 8 oz portions or less  Diet history - has lost weight in the past due to walking. 2013 - back surgery, Sept 2014 - foot surgery. Recently lost 14 lbs over the past few months. Desired weight is 150 lbs. Weighed 115 lbs before they removed half her thyroid (prior to then ate anything that she wanted) - 130-230, lost 100 lbs in a month after being removed from Depakote. 2 pregnancies, 2006 and 2008 - "and hasn't lost weight since."    Preferred Learning Style:   No preference indicated   Learning Readiness:   Not ready   MEDICATIONS: See chart   DIETARY INTAKE:  Usual eating pattern includes 2 meals and 1 snacks per day.   24-hr recall:  Wake: 6:30/7:00 B ( AM): skips, breakfast makes her sick  Snk ( AM): water  L (11-12:00 PM): fruit, pasta, popcorn, oscar meyer nachos, water (skips 2-3 times per month) Snk ( PM): nothing - water D ( PM): chicken salads, steak-sweet potato or salad, BLT, pancakes, pizza -water or sweet tea - homemade. 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 [employer] crispy chicken salad.  Usual physical activity: walking-walking 4-5 times a week 4-5 miles before it got hot and Wii fit - yoga and balance and Wii dance - work is very active, plays outside with children - football and frisbee  Estimated energy needs: 1800 calories 200 g carbohydrates 135 g protein 50 g fat  Progress Towards Goal(s):  In progress.   Nutritional Diagnosis:  Winslow-3.3 Overweight/obesity As related to inactivity due to recent surgeries, meal skipping, previous soda and sweet tea intake.  As evidenced by patient report and BMI greater than 30.    Intervention:  Nutrition education and counseling.  Plan: 1) Try to eat breakfast everyday  2) Include protein and carbohydrate at meals and snacks 3) Consider adding vegetables to lunch 4) Resume walking when weather is more conducive - great job being active otherwise 5) Continue to drink water instead of soda  Teaching Method Utilized:   Auditory   Handouts given during visit include:   Barriers to learning/adherence to lifestyle change: None  Demonstrated degree of understanding via:  Teach Back   Monitoring/Evaluation:  Dietary intake, exercise, , and body weight prn.

## 2013-09-24 ENCOUNTER — Telehealth: Payer: Self-pay

## 2013-09-24 NOTE — Telephone Encounter (Addendum)
Pt called in and states she was here yesterday and she got a script for Tramadol but she is not able to take it. It makes her sick. She has trouble with most pain meds and would like some advise on what she can do for the back pain. She can be reached @ [phone].Thank you

## 2013-09-26 NOTE — Telephone Encounter (Signed)
Chart pulled (DOS 09/23/13) will take to nurse's station.

## 2013-09-26 NOTE — Telephone Encounter (Signed)
Pt was seen under workers comp- need chart to complete message.

## 2013-11-05 ENCOUNTER — Encounter: Payer: Self-pay | Admitting: Family Medicine

## 2013-11-05 ENCOUNTER — Ambulatory Visit (INDEPENDENT_AMBULATORY_CARE_PROVIDER_SITE_OTHER): Payer: Managed Care, Other (non HMO) | Admitting: Family Medicine

## 2013-11-05 VITALS — BP 110/70 | HR 105 | Temp 100.0°F | Wt 186.0 lb

## 2013-11-05 DIAGNOSIS — J020 Streptococcal pharyngitis: Secondary | ICD-10-CM

## 2013-11-05 LAB — POCT RAPID STREP A (OFFICE): RAPID STREP A SCREEN: POSITIVE — AB

## 2013-11-05 MED ORDER — AMOXICILLIN 875 MG PO TABS: 875.0000 mg | ORAL_TABLET | Freq: Two times a day (BID) | ORAL | Status: DC

## 2013-11-05 NOTE — Progress Notes (Signed)
   Subjective:    Patient ID: Yolanda Weaver, female    DOB: 18-Aug-1970, 43 y.o.   MRN: [id number]  HPI She complains of a several day history of sore throat and fever with previous exposure to strep. No earache, cough or congestion.   Review of Systems     Objective:   Physical Exam alert and in no distress. Tympanic membranes and canals are normal. Tonsils are red, swollen with some exudate.. Neck is supple with anterior cervical adenopathy no thyromegaly. Cardiac exam shows a regular sinus rhythm without murmurs or gallops. Lungs are clear to auscultation.        Assessment & Plan:  Strep pharyngitis - Plan: POCT rapid strep A, amoxicillin (AMOXIL) 875 MG tablet  conservative care and call if further trouble.

## 2013-11-06 ENCOUNTER — Encounter: Payer: Self-pay | Admitting: Family Medicine

## 2013-11-06 ENCOUNTER — Other Ambulatory Visit: Payer: Self-pay | Admitting: Orthopaedic Surgery

## 2013-11-06 ENCOUNTER — Telehealth: Payer: Self-pay | Admitting: Family Medicine

## 2013-11-06 DIAGNOSIS — M545 Low back pain: Secondary | ICD-10-CM

## 2013-11-06 NOTE — Telephone Encounter (Signed)
done

## 2013-11-06 NOTE — Telephone Encounter (Signed)
Give her a note 

## 2013-11-15 ENCOUNTER — Ambulatory Visit
Admission: RE | Admit: 2013-11-15 | Discharge: 2013-11-15 | Disposition: A | Discharge status: Home / Self Care | Payer: Managed Care, Other (non HMO) | Source: Ambulatory Visit | Attending: Orthopaedic Surgery | Admitting: Orthopaedic Surgery

## 2013-11-15 DIAGNOSIS — M545 Low back pain: Secondary | ICD-10-CM

## 2013-11-15 MED ORDER — GADOBENATE DIMEGLUMINE 529 MG/ML IV SOLN
17.0000 mL | Freq: Once | INTRAVENOUS | Status: AC | PRN
  Administered 2013-11-15: 17 mL via INTRAVENOUS

## 2014-01-14 ENCOUNTER — Telehealth: Payer: Self-pay | Admitting: Family Medicine

## 2014-01-14 NOTE — Telephone Encounter (Signed)
Pt called and states that Yolanda Weaver is sending us pt records as she is dismissing pt saying pt was belligerent to her staff and pt just wanted us to know.

## 2014-01-20 ENCOUNTER — Encounter: Payer: Self-pay | Admitting: Family Medicine

## 2014-01-20 ENCOUNTER — Ambulatory Visit (INDEPENDENT_AMBULATORY_CARE_PROVIDER_SITE_OTHER): Payer: Managed Care, Other (non HMO) | Admitting: Family Medicine

## 2014-01-20 VITALS — BP 120/80 | HR 80 | Wt 194.0 lb

## 2014-01-20 DIAGNOSIS — F3170 Bipolar disorder, currently in remission, most recent episode unspecified: Secondary | ICD-10-CM

## 2014-01-20 DIAGNOSIS — E669 Obesity, unspecified: Secondary | ICD-10-CM

## 2014-01-20 MED ORDER — PHENTERMINE-TOPIRAMATE ER 3.75-23 MG PO CP24: 1.0000 | ORAL_CAPSULE | Freq: Every day | ORAL | Status: DC

## 2014-01-20 NOTE — Progress Notes (Signed)
   Subjective:    Patient ID: Yolanda Weaver, female    DOB: 16-Nov-1970, 43 y.o.   MRN: [id number]  HPI She is here for consult. She recently had difficulty with her psychiatrist and was dismissed from that practice. She states she did nothing to cause this. I explained that the psychiatrist would not kick her out for no reason. I then moved on. She states that she does not think that she is bipolar. She has started into an anger management program and is seeing a therapist on a regular basis. She does admit to being angry over general life issues. She will need medication renewal. She is actually here for discussion of weight loss. She apparently has made some diet and exercise changes but has not had much success. Her work and home life keep her quite busy.   Review of Systems     Objective:   Physical Exam Alert and in no distress with appropriate affect       Assessment & Plan:  Obesity - Plan: Phentermine-Topiramate 3.75-23 MG CP24  Bipolar affective disorder in remission  I will continue her on present medications. I discussed the fact that the weight loss medication might possibly have an effect psychologically on her and we need to watch this very carefully. She verbalized understanding of this. If she runs into trouble psychiatrically, I will help her get into see a different psychiatrist. I discussed anger and anger management with her only briefly.

## 2014-01-22 ENCOUNTER — Telehealth: Payer: Self-pay | Admitting: Family Medicine

## 2014-01-22 NOTE — Telephone Encounter (Signed)
lm

## 2014-01-29 ENCOUNTER — Telehealth: Payer: Self-pay | Admitting: Family Medicine

## 2014-01-29 NOTE — Telephone Encounter (Deleted)
Initiated P.A. Qsymia

## 2014-01-29 NOTE — Telephone Encounter (Signed)
Pt states Qsymia working good, no problems wants next refill called into her pharmacy

## 2014-01-30 MED ORDER — PHENTERMINE-TOPIRAMATE ER 7.5-46 MG PO CP24: 1.0000 | ORAL_CAPSULE | Freq: Every day | ORAL | Status: DC

## 2014-02-04 NOTE — Telephone Encounter (Signed)
P.A. Approved til 05/05/14, faxed pharmacy, pt informed

## 2014-02-17 ENCOUNTER — Telehealth: Payer: Self-pay | Admitting: Family Medicine

## 2014-02-17 MED ORDER — LORCASERIN HCL 10 MG PO TABS: 10.0000 mg | ORAL_TABLET | Freq: Two times a day (BID) | ORAL | Status: DC

## 2014-02-17 NOTE — Telephone Encounter (Signed)
Pt states she can't take the Qsymia, states made her retain fluid bad and also left bad taste in her mouth.  Wants to try something else. Also said you told her you would refill her Lamictal when she needed it and she needs 90 day refill of Lamictal.

## 2014-02-17 NOTE — Telephone Encounter (Signed)
I will switch her to Belviq. Potential side effects discussed with her specifically psychological. Recheck here one month.

## 2014-02-18 ENCOUNTER — Telehealth: Payer: Self-pay | Admitting: Family Medicine

## 2014-02-18 MED ORDER — LAMOTRIGINE 200 MG PO TABS: 300.0000 mg | ORAL_TABLET | Freq: Every day | ORAL | Status: DC

## 2014-02-18 NOTE — Telephone Encounter (Signed)
Lamictal and Belviq [facility] the [facility]

## 2014-02-18 NOTE — Telephone Encounter (Signed)
Pt came by to get Belviq discount card and Rx.  Shows printed but not up front.  Need Belviq called into pharmacy and also pt asked about the Lamictal refill

## 2014-02-21 ENCOUNTER — Telehealth: Payer: Self-pay | Admitting: Family Medicine

## 2014-02-21 NOTE — Telephone Encounter (Signed)
P.A. Sharyn CreamerBelviq approved til 05/23/14, faxed pharmacy, pt informed

## 2014-03-10 ENCOUNTER — Ambulatory Visit (INDEPENDENT_AMBULATORY_CARE_PROVIDER_SITE_OTHER): Payer: BLUE CROSS/BLUE SHIELD | Admitting: Family Medicine

## 2014-03-10 ENCOUNTER — Encounter: Payer: Self-pay | Admitting: Family Medicine

## 2014-03-10 VITALS — BP 140/90 | HR 94 | Temp 98.2°F | Wt 190.0 lb

## 2014-03-10 DIAGNOSIS — E039 Hypothyroidism, unspecified: Secondary | ICD-10-CM

## 2014-03-10 DIAGNOSIS — E669 Obesity, unspecified: Secondary | ICD-10-CM

## 2014-03-10 DIAGNOSIS — F3170 Bipolar disorder, currently in remission, most recent episode unspecified: Secondary | ICD-10-CM

## 2014-03-10 DIAGNOSIS — J0100 Acute maxillary sinusitis, unspecified: Secondary | ICD-10-CM

## 2014-03-10 DIAGNOSIS — J301 Allergic rhinitis due to pollen: Secondary | ICD-10-CM

## 2014-03-10 DIAGNOSIS — Z79899 Other long term (current) drug therapy: Secondary | ICD-10-CM

## 2014-03-10 LAB — CBC WITH DIFFERENTIAL/PLATELET
BASOS ABS: 0 10*3/uL (ref 0.0–0.1)
BASOS PCT: 0 % (ref 0–1)
Eosinophils Absolute: 0.1 10*3/uL (ref 0.0–0.7)
Eosinophils Relative: 2 % (ref 0–5)
HCT: 41.5 % (ref 36.0–46.0)
HEMOGLOBIN: 14.2 g/dL (ref 12.0–15.0)
Lymphocytes Relative: 18 % (ref 12–46)
Lymphs Abs: 1.3 10*3/uL (ref 0.7–4.0)
MCH: 30.7 pg (ref 26.0–34.0)
MCHC: 34.2 g/dL (ref 30.0–36.0)
MCV: 89.6 fL (ref 78.0–100.0)
MONO ABS: 0.6 10*3/uL (ref 0.1–1.0)
MONOS PCT: 8 % (ref 3–12)
MPV: 8.3 fL — ABNORMAL LOW (ref 8.6–12.4)
Neutro Abs: 5.1 10*3/uL (ref 1.7–7.7)
Neutrophils Relative %: 72 % (ref 43–77)
Platelets: 321 10*3/uL (ref 150–400)
RBC: 4.63 MIL/uL (ref 3.87–5.11)
RDW: 13.1 % (ref 11.5–15.5)
WBC: 7.1 10*3/uL (ref 4.0–10.5)

## 2014-03-10 MED ORDER — AMOXICILLIN-POT CLAVULANATE 875-125 MG PO TABS
1.0000 | ORAL_TABLET | Freq: Two times a day (BID) | ORAL | Status: DC
Start: 2014-03-10 — End: 2014-12-25

## 2014-03-10 NOTE — Progress Notes (Signed)
   Subjective:    Patient ID: Yolanda Weaver, female    DOB: 1970/03/16, 44 y.o.   MRN: [id number]  HPI She has a one-week history of periodic nasal drainage, sinus congestion, PND that is bloody as well as left maxillary sinus pressure. No fever, chills. She does not smoke. She does have underlying allergies. She continues on her thyroid medicine as well as medicines for her underlying bipolar disorder. She is doing well on those. [facility] but apparently this did not get filled.   Review of Systems     Objective:   Physical Exam alert and in no distress. Tympanic membranes and canals are normal. Nasal mucosa is red. Tender to palpation especially over left maxillary sinus. Throat is clear. Tonsils are normal. Neck is supple without adenopathy or thyromegaly. Cardiac exam shows a regular sinus rhythm without murmurs or gallops. Lungs are clear to auscultation.        Assessment & Plan:  Obesity - Plan: CBC with Differential/Platelet, Comprehensive metabolic panel, Lipid panel  Acute maxillary sinusitis, recurrence not specified - Plan: amoxicillin-clavulanate (AUGMENTIN) 875-125 MG per tablet  Hypothyroidism, unspecified hypothyroidism type - Plan: TSH  Allergic rhinitis due to pollen  Encounter for long-term (current) use of medications - Plan: CBC with Differential/Platelet, Comprehensive metabolic panel, Lipid panel, TSH  Bipolar affective disorder in remission  she we placed on Augmentin. I will follow-up on her thyroid function after the blood work is back. His long as she is stable I will continue to prescribe her bipolar medications.

## 2014-03-11 LAB — COMPREHENSIVE METABOLIC PANEL
ALT: 16 U/L (ref 0–35)
AST: 18 U/L (ref 0–37)
Albumin: 4.4 g/dL (ref 3.5–5.2)
Alkaline Phosphatase: 109 U/L (ref 39–117)
BILIRUBIN TOTAL: 0.4 mg/dL (ref 0.2–1.2)
BUN: 10 mg/dL (ref 6–23)
CO2: 26 mEq/L (ref 19–32)
CREATININE: 0.66 mg/dL (ref 0.50–1.10)
Calcium: 9.3 mg/dL (ref 8.4–10.5)
Chloride: 104 mEq/L (ref 96–112)
GLUCOSE: 92 mg/dL (ref 70–99)
Potassium: 4 mEq/L (ref 3.5–5.3)
SODIUM: 140 meq/L (ref 135–145)
TOTAL PROTEIN: 7 g/dL (ref 6.0–8.3)

## 2014-03-11 LAB — LIPID PANEL
CHOLESTEROL: 156 mg/dL (ref 0–200)
HDL: 54 mg/dL (ref >39)
LDL Cholesterol: 80 mg/dL (ref 0–99)
TRIGLYCERIDES: 111 mg/dL (ref <150)
Total CHOL/HDL Ratio: 2.9 Ratio
VLDL: 22 mg/dL (ref 0–40)

## 2014-03-11 LAB — TSH: TSH: 1.907 u[IU]/mL (ref 0.350–4.500)

## 2014-03-11 MED ORDER — LEVOTHYROXINE SODIUM 50 MCG PO TABS: 50.0000 ug | ORAL_TABLET | Freq: Every day | ORAL | Status: DC

## 2014-03-11 NOTE — Addendum Note (Signed)
Addended by: Ronnald NianLALONDE, Akilah Cureton C on: 03/11/2014 08:21 AM   Modules accepted: Orders

## 2014-03-31 ENCOUNTER — Telehealth: Payer: Self-pay | Admitting: Internal Medicine

## 2014-03-31 ENCOUNTER — Other Ambulatory Visit: Payer: Self-pay

## 2014-03-31 ENCOUNTER — Telehealth: Payer: Self-pay | Admitting: Family Medicine

## 2014-03-31 MED ORDER — LEVOTHYROXINE SODIUM 50 MCG PO TABS: 50.0000 ug | ORAL_TABLET | Freq: Every day | ORAL | Status: DC

## 2014-03-31 NOTE — Telephone Encounter (Signed)
done

## 2014-03-31 NOTE — Telephone Encounter (Signed)
Rcvd a Physician results Form ([employer]) fax from patient requesting that this be completed and faxed to [facility] based on her last visit on 03/11/14. Patient has not had a CPE since 07/01/10. Called pt and left message to call back to schedule a CPE so that form can be completed

## 2014-03-31 NOTE — Telephone Encounter (Signed)
Refill request for levothyroxine 0.mg to [facility]

## 2014-04-01 ENCOUNTER — Telehealth: Payer: Self-pay | Admitting: Family Medicine

## 2014-04-01 NOTE — Telephone Encounter (Signed)
Pt faxed Physician Form ([employer] Form) to be completed and faxed to [facility] explained to her that she need to scheduled a CPE since she has not had one since 07/01/10 however pt says at her last visit Dr Susann GivensLalonde told her that this form could be completed without a CPE (since she does not get regular CPE's) and since Dr Susann GivensLalonde performed labs that were needed to complete the form. Sent the form back to Dr Susann GivensLalonde to review for completion

## 2014-04-10 ENCOUNTER — Telehealth: Payer: Self-pay | Admitting: Internal Medicine

## 2014-04-10 MED ORDER — LAMOTRIGINE 200 MG PO TABS: 300.0000 mg | ORAL_TABLET | Freq: Every day | ORAL | Status: DC

## 2014-04-10 NOTE — Telephone Encounter (Signed)
Refill request for lamotrigine 200mg  to [facility]

## 2014-04-20 ENCOUNTER — Emergency Department (HOSPITAL_COMMUNITY): Payer: BLUE CROSS/BLUE SHIELD

## 2014-04-20 ENCOUNTER — Encounter (HOSPITAL_COMMUNITY): Payer: Self-pay

## 2014-04-20 ENCOUNTER — Emergency Department (HOSPITAL_COMMUNITY)
Admission: EM | Admit: 2014-04-20 | Discharge: 2014-04-21 | Disposition: A | Discharge status: Home / Self Care | Payer: BLUE CROSS/BLUE SHIELD | Attending: Emergency Medicine | Admitting: Emergency Medicine

## 2014-04-20 DIAGNOSIS — Z872 Personal history of diseases of the skin and subcutaneous tissue: Secondary | ICD-10-CM | POA: Insufficient documentation

## 2014-04-20 DIAGNOSIS — R0789 Other chest pain: Secondary | ICD-10-CM | POA: Insufficient documentation

## 2014-04-20 DIAGNOSIS — F319 Bipolar disorder, unspecified: Secondary | ICD-10-CM | POA: Diagnosis not present

## 2014-04-20 DIAGNOSIS — Z8669 Personal history of other diseases of the nervous system and sense organs: Secondary | ICD-10-CM | POA: Insufficient documentation

## 2014-04-20 DIAGNOSIS — Z87891 Personal history of nicotine dependence: Secondary | ICD-10-CM | POA: Diagnosis not present

## 2014-04-20 DIAGNOSIS — R609 Edema, unspecified: Secondary | ICD-10-CM

## 2014-04-20 DIAGNOSIS — R600 Localized edema: Secondary | ICD-10-CM | POA: Diagnosis not present

## 2014-04-20 DIAGNOSIS — E039 Hypothyroidism, unspecified: Secondary | ICD-10-CM | POA: Insufficient documentation

## 2014-04-20 DIAGNOSIS — Z79899 Other long term (current) drug therapy: Secondary | ICD-10-CM | POA: Diagnosis not present

## 2014-04-20 DIAGNOSIS — G43909 Migraine, unspecified, not intractable, without status migrainosus: Secondary | ICD-10-CM | POA: Insufficient documentation

## 2014-04-20 DIAGNOSIS — R0602 Shortness of breath: Secondary | ICD-10-CM | POA: Diagnosis present

## 2014-04-20 DIAGNOSIS — E669 Obesity, unspecified: Secondary | ICD-10-CM | POA: Insufficient documentation

## 2014-04-20 DIAGNOSIS — Z8709 Personal history of other diseases of the respiratory system: Secondary | ICD-10-CM | POA: Insufficient documentation

## 2014-04-20 DIAGNOSIS — Z792 Long term (current) use of antibiotics: Secondary | ICD-10-CM | POA: Insufficient documentation

## 2014-04-20 LAB — COMPREHENSIVE METABOLIC PANEL
ALK PHOS: 84 U/L (ref 39–117)
ALT: 14 U/L (ref 0–35)
AST: 17 U/L (ref 0–37)
Albumin: 4.6 g/dL (ref 3.5–5.2)
Anion gap: 11 (ref 5–15)
BUN: 9 mg/dL (ref 6–23)
CALCIUM: 9.3 mg/dL (ref 8.4–10.5)
CO2: 24 mmol/L (ref 19–32)
CREATININE: 0.68 mg/dL (ref 0.50–1.10)
Chloride: 105 mmol/L (ref 96–112)
GFR calc Af Amer: >90 mL/min (ref >90)
GFR calc non Af Amer: >90 mL/min (ref >90)
GLUCOSE: 96 mg/dL (ref 70–99)
Potassium: 3.3 mmol/L — ABNORMAL LOW (ref 3.5–5.1)
Sodium: 140 mmol/L (ref 135–145)
TOTAL PROTEIN: 7.6 g/dL (ref 6.0–8.3)
Total Bilirubin: 0.8 mg/dL (ref 0.3–1.2)

## 2014-04-20 LAB — CBC
HCT: 41 % (ref 36.0–46.0)
HEMOGLOBIN: 13.6 g/dL (ref 12.0–15.0)
MCH: 29.8 pg (ref 26.0–34.0)
MCHC: 33.2 g/dL (ref 30.0–36.0)
MCV: 89.9 fL (ref 78.0–100.0)
Platelets: 284 10*3/uL (ref 150–400)
RBC: 4.56 MIL/uL (ref 3.87–5.11)
RDW: 12.7 % (ref 11.5–15.5)
WBC: 8.4 10*3/uL (ref 4.0–10.5)

## 2014-04-20 LAB — I-STAT TROPONIN, ED: Troponin i, poc: 0 ng/mL (ref 0.00–0.08)

## 2014-04-20 NOTE — ED Notes (Signed)
RN,Brandy made aware pt. Blood pressure elevated. 

## 2014-04-20 NOTE — ED Provider Notes (Signed)
CSN: [id number]     Arrival date & time 04/20/14  1732 History   First MD Initiated Contact with Patient 04/20/14 2249     Chief Complaint  Patient presents with  . Chest Pain  . Shortness of Breath  . Dizziness     (Consider location/radiation/quality/duration/timing/severity/associated sxs/prior Treatment) HPI Chest pain starting yesterday at work. She reports it was left and central. Sharp and radiating up into her back. She has extreme shortness of breath and sweating with this pain. She reports that she has had lower extremity swelling, particularly noting her left lower extremity have gotten red and swollen. She reports 4 years ago she had an episode of chest pain and got evaluation. She denies any recurrence since that time. She has not had any recent long trips or surgical procedures. No personal history of pulmonary embolus. She reports her blood pressure the bit elevated but she does not have a history of hypertension. She denies that she's had any recent fever or cough. The patient reports that she takes daily ibuprofen because of her chronic back pain. She reports she can't take any other pain medications. She did have her blood pressure checked about a week ago and her orthopedic doctor's office and the systolic pressure was 155 at that time. Past Medical History  Diagnosis Date  . Acne   . Contraception   . Bipolar 1 disorder   . Migraine   . Ptosis, right eyelid   . Allergy   . Hypothyroid   . PONV (postoperative nausea and vomiting)   . Blood transfusion     h/o tx for rh incompatibility during pregnancy. see note re significant antibody  . Mental disorder     bipolar  . Recurrent upper respiratory infection (URI)     reports she had bronchitis, treated /w antibiotics in past month  . Obesity    Past Surgical History  Procedure Laterality Date  . Knee arthroscopy      x3 r  . Diagnostic laparoscopy      evaluation for endometriosis  . Thyroidectomy, partial    .  Lumbar laminectomy  04/08/2011    Procedure: MICRODISCECTOMY LUMBAR LAMINECTOMY;  Surgeon: Eldred Manges, MD;  Location: [facility] OR;  Service: Orthopedics;  Laterality: Right;  Right L5-S1 Microdiscectomy   Family History  Problem Relation Age of Onset  . COPD Father   . Anesthesia problems Neg Hx   . Hypotension Neg Hx   . Malignant hyperthermia Neg Hx   . Pseudochol deficiency Neg Hx   . Cancer Other   . Hypertension Other   . Diabetes Other    History  Substance Use Topics  . Smoking status: Former Smoker    Quit date: 02/07/2001  . Smokeless tobacco: Never Used  . Alcohol Use: 0.6 oz/week    1 Glasses of wine per week     Comment: couple times a week   OB History    No data available     Review of Systems  10 Systems reviewed and are negative for acute change except as noted in the HPI.   Allergies  Acetaminophen; Dilaudid; Hydrocodone; Oxycodone; and Erythromycin  Home Medications   Prior to Admission medications   Medication Sig Start Date End Date Taking? Authorizing Provider  Diphenhydramine-PE-APAP 25-5-325 MG TABS Take 1 tablet by mouth every 8 (eight) hours as needed (sinus).   Yes Historical Provider, MD  hyoscyamine (LEVSIN/SL) 0.125 MG SL tablet Place 1 tablet (0.125 mg total) under the tongue  every 4 (four) hours as needed for cramping. 07/09/13  Yes Ronnald Nian, MD  ibuprofen (ADVIL,MOTRIN) 800 MG tablet TAKE 1 TABLET BY MOUTH EVERY 8 HOURS AS NEEDED WITH FOOD Patient taking differently: TAKE 1 TABLET BY MOUTH EVERY 8 HOURS AS NEEDED WITH FOOD PAIN 02/13/13  Yes Delories Heinz, DPM  lamoTRIgine (LAMICTAL) 200 MG tablet Take 1.5 tablets (300 mg total) by mouth at bedtime. 04/10/14  Yes Ronnald Nian, MD  levothyroxine (SYNTHROID, LEVOTHROID) 50 MCG tablet Take 1 tablet (50 mcg total) by mouth at bedtime. 03/31/14  Yes Ronnald Nian, MD  Lorcaserin HCl (BELVIQ) 10 MG TABS Take 10 mg by mouth 2 (two) times daily. 02/17/14  Yes Ronnald Nian, MD   amoxicillin-clavulanate (AUGMENTIN) 875-125 MG per tablet Take 1 tablet by mouth 2 (two) times daily. Patient not taking: Reported on 04/20/2014 03/10/14   Ronnald Nian, MD  clindamycin (CLEOCIN-T) 1 % lotion Apply topically 2 (two) times daily. Patient not taking: Reported on 03/10/2014 07/09/13   Ronnald Nian, MD  hydrochlorothiazide (HYDRODIURIL) 25 MG tablet Take 1 tablet (25 mg total) by mouth daily. Patient not taking: Reported on 04/21/2014 04/21/14   Arby Barrette, MD  pantoprazole (PROTONIX) 20 MG tablet Take 1 tablet (20 mg total) by mouth daily. Patient not taking: Reported on 04/21/2014 04/21/14   Arby Barrette, MD   BP 145/84 mmHg  Pulse 86  Temp(Src) 98.4 F (36.9 C) (Oral)  Resp 14  SpO2 98% Physical Exam  Constitutional: She is oriented to person, place, and time. She appears well-developed and well-nourished.  HENT:  Head: Normocephalic and atraumatic.  Eyes: EOM are normal. Pupils are equal, round, and reactive to light.  Neck: Neck supple.  Cardiovascular: Normal rate, regular rhythm, normal heart sounds and intact distal pulses.   Pulmonary/Chest: Effort normal and breath sounds normal.  Abdominal: Soft. Bowel sounds are normal. She exhibits no distension. There is no tenderness.  Musculoskeletal: Normal range of motion. She exhibits no edema.  Neurological: She is alert and oriented to person, place, and time. She has normal strength. Coordination normal. GCS eye subscore is 4. GCS verbal subscore is 5. GCS motor subscore is 6.  Skin: Skin is warm, dry and intact.  Psychiatric: She has a normal mood and affect.    ED Course  Procedures (including critical care time) Labs Review Labs Reviewed  COMPREHENSIVE METABOLIC PANEL - Abnormal; Notable for the following:    Potassium 3.3 (*)    All other components within normal limits  URINALYSIS, ROUTINE W REFLEX MICROSCOPIC - Abnormal; Notable for the following:    APPearance CLOUDY (*)    Hgb urine dipstick TRACE (*)     Bilirubin Urine SMALL (*)    Ketones, ur 40 (*)    All other components within normal limits  URINE MICROSCOPIC-ADD ON - Abnormal; Notable for the following:    Bacteria, UA FEW (*)    All other components within normal limits  CBC  I-STAT TROPOININ, ED    Imaging Review No results found.   EKG Interpretation   Date/Time:  Sunday April 20 2014 17:37:03 EDT Ventricular Rate:  89 PR Interval:  138 QRS Duration: 104 QT Interval:  410 QTC Calculation: 499 R Axis:   -44 Text Interpretation:  Sinus rhythm Consider left atrial enlargement Left  axis deviation Borderline prolonged QT interval no change. Confirmed by  Donnald Garre, MD, Lebron Conners [phone]) on 04/20/2014 11:09:46 PM      MDM   Final  diagnoses:  Other chest pain  Peripheral edema   CT PE study has ruled out for pulmonary embolus. There is no elevation of patient's cardiac enzymes or EKG changes suggestive of ischemia. The patient has been taking routine ibuprofen for back pain that is chronic. She has noted some elevation in her blood pressures more recently and peripheral edema. I did suggest the cessation of ibuprofen considering possible source of elevated blood pressure and peripheral edema as well as possible gastritis and or esophageal spasm. The patient states that she cannot stop taking ibuprofen because it is only thing she can take for her pain. At this time her general condition is good and I feel she is safe for continued outpatient management. She is advised to initiate omeprazole for suspected gastritis/GERD. As well as she is started on hydrochlorothiazide for peripheral edema and moderately elevated blood pressure. She is encouraged to discontinue the ibuprofen and explore other pain management options with her family physician as well as further diagnostic workup for her symptoms. She is counseled on signs and symptoms for which return to the emergency department.    Arby BarretteMarcy Yoandri Congrove, MD 04/24/14 [phone]

## 2014-04-20 NOTE — ED Notes (Signed)
Patient c/o intermittent left  chest pain x 2 days. Patient states she has SOB, dizziness today with chest pain. Patieant states last night she experienced SOB, diaphoresis, N/V when she had chest pain

## 2014-04-21 ENCOUNTER — Encounter: Payer: Self-pay | Admitting: Family Medicine

## 2014-04-21 ENCOUNTER — Ambulatory Visit (INDEPENDENT_AMBULATORY_CARE_PROVIDER_SITE_OTHER): Payer: BLUE CROSS/BLUE SHIELD | Admitting: Family Medicine

## 2014-04-21 ENCOUNTER — Encounter (HOSPITAL_COMMUNITY): Payer: Self-pay

## 2014-04-21 VITALS — BP 150/96 | HR 96 | Wt 192.0 lb

## 2014-04-21 DIAGNOSIS — I10 Essential (primary) hypertension: Secondary | ICD-10-CM | POA: Diagnosis not present

## 2014-04-21 DIAGNOSIS — M940 Chondrocostal junction syndrome [Tietze]: Secondary | ICD-10-CM | POA: Diagnosis not present

## 2014-04-21 LAB — URINE MICROSCOPIC-ADD ON

## 2014-04-21 LAB — URINALYSIS, ROUTINE W REFLEX MICROSCOPIC
GLUCOSE, UA: NEGATIVE mg/dL
Ketones, ur: 40 mg/dL — AB
LEUKOCYTES UA: NEGATIVE
Nitrite: NEGATIVE
PH: 6 (ref 5.0–8.0)
PROTEIN: NEGATIVE mg/dL
Specific Gravity, Urine: 1.024 (ref 1.005–1.030)
Urobilinogen, UA: 0.2 mg/dL (ref 0.0–1.0)

## 2014-04-21 MED ORDER — HYDROCHLOROTHIAZIDE 12.5 MG PO CAPS
25.0000 mg | ORAL_CAPSULE | Freq: Once | ORAL | Status: AC
  Administered 2014-04-21: 25 mg via ORAL
  Filled 2014-04-21: qty 2

## 2014-04-21 MED ORDER — HYDROCHLOROTHIAZIDE 25 MG PO TABS: 25.0000 mg | ORAL_TABLET | Freq: Every day | ORAL | Status: DC

## 2014-04-21 MED ORDER — IOHEXOL 300 MG/ML  SOLN: 100.0000 mL | Freq: Once | INTRAMUSCULAR | Status: DC | PRN

## 2014-04-21 MED ORDER — PANTOPRAZOLE SODIUM 40 MG PO TBEC
40.0000 mg | DELAYED_RELEASE_TABLET | Freq: Once | ORAL | Status: AC
Start: 2014-04-21 — End: 2014-04-21
  Administered 2014-04-21: 40 mg via ORAL
  Filled 2014-04-21: qty 1

## 2014-04-21 MED ORDER — IOHEXOL 350 MG/ML SOLN
100.0000 mL | Freq: Once | INTRAVENOUS | Status: AC | PRN
  Administered 2014-04-21: 100 mL via INTRAVENOUS

## 2014-04-21 MED ORDER — PANTOPRAZOLE SODIUM 20 MG PO TBEC: 20.0000 mg | DELAYED_RELEASE_TABLET | Freq: Every day | ORAL | Status: DC

## 2014-04-21 NOTE — Progress Notes (Signed)
   Subjective:    Patient ID: Yolanda Weaver, female    DOB: [birth year], 44 y.o.   MRN: [id number]  HPI She is here for recheck after recent hospital visit. She was seen last night in the emergency room for evaluation of chest pain. She describes the pain as pleuritic in nature, hurting worse when she would cough or lift something. The emergency room records was reviewed. Blood work was negative and the CT scan was also negative.   Review of Systems     Objective:   Physical Exam Alert and in no distress. Cardiac exam shows regular rhythm without murmurs or gallops. Lungs are clear to auscultation. She does have palpable chest wall tenderness along the left second and third costochondral junctions. Review of her medical record does indicate that she does have borderline hypertension. She was given a prescription for HCTZ in the emergency room.      Assessment & Plan:  Costochondritis  Essential hypertension Recommend she use Advil 800 3 times a day or Aleve 2 pills twice per day for the costochondritis. She will also start taking the HCTZ and return here in one month for recheck. She will also continue on her weight loss medication.

## 2014-04-21 NOTE — Discharge Instructions (Signed)
Chest Pain (Nonspecific) It is often hard to give a diagnosis for the cause of chest pain. There is always a chance that your pain could be related to something serious, such as a heart attack or a blood clot in the lungs. You need to follow up with your doctor. HOME CARE  If antibiotic medicine was given, take it as directed by your doctor. Finish the medicine even if you start to feel better.  For the next few days, avoid activities that bring on chest pain. Continue physical activities as told by your doctor.  Do not use any tobacco products. This includes cigarettes, chewing tobacco, and e-cigarettes.  Avoid drinking alcohol.  Only take medicine as told by your doctor.  Follow your doctor's suggestions for more testing if your chest pain does not go away.  Keep all doctor visits you made. GET HELP IF:  Your chest pain does not go away, even after treatment.  You have a rash with blisters on your chest.  You have a fever. GET HELP RIGHT AWAY IF:   You have more pain or pain that spreads to your arm, neck, jaw, back, or belly (abdomen).  You have shortness of breath.  You cough more than usual or cough up blood.  You have very bad back or belly pain.  You feel sick to your stomach (nauseous) or throw up (vomit).  You have very bad weakness.  You pass out (faint).  You have chills. This is an emergency. Do not wait to see if the problems will go away. Call your local emergency services (911 in [place] Do not drive yourself to the hospital. MAKE SURE YOU:   Understand these instructions.  Will watch your condition.  Will get help right away if you are not doing well or get worse. Document Released: 07/13/2007 Document Revised: 01/29/2013 Document Reviewed: 07/13/2007 [facility] Patient Information 2015 [place], [place]. This information is not intended to replace advice given to you by your health care provider. Make sure you discuss any questions you have with your  health care provider.  Possible Gastroesophageal Reflux Disease, Adult Gastroesophageal reflux disease (GERD) happens when acid from your stomach flows up into the esophagus. When acid comes in contact with the esophagus, the acid causes soreness (inflammation) in the esophagus. Over time, GERD may create small holes (ulcers) in the lining of the esophagus. CAUSES   Increased body weight. This puts pressure on the stomach, making acid rise from the stomach into the esophagus.  Smoking. This increases acid production in the stomach.  Drinking alcohol. This causes decreased pressure in the lower esophageal sphincter (valve or ring of muscle between the esophagus and stomach), allowing acid from the stomach into the esophagus.  Late evening meals and a full stomach. This increases pressure and acid production in the stomach.  A malformed lower esophageal sphincter. Sometimes, no cause is found. SYMPTOMS   Burning pain in the lower part of the mid-chest behind the breastbone and in the mid-stomach area. This may occur twice a week or more often.  Trouble swallowing.  Sore throat.  Dry cough.  Asthma-like symptoms including chest tightness, shortness of breath, or wheezing. DIAGNOSIS  Your caregiver may be able to diagnose GERD based on your symptoms. In some cases, X-rays and other tests may be done to check for complications or to check the condition of your stomach and esophagus. TREATMENT  Your caregiver may recommend over-the-counter or prescription medicines to help decrease acid production. Ask your caregiver before  starting or adding any new medicines.  HOME CARE INSTRUCTIONS   Change the factors that you can control. Ask your caregiver for guidance concerning weight loss, quitting smoking, and alcohol consumption.  Avoid foods and drinks that make your symptoms worse, such as:  Caffeine or alcoholic drinks.  Chocolate.  Peppermint or mint flavorings.  Garlic and  onions.  Spicy foods.  Citrus fruits, such as oranges, lemons, or limes.  Tomato-based foods such as sauce, chili, salsa, and pizza.  Fried and fatty foods.  Avoid lying down for the 3 hours prior to your bedtime or prior to taking a nap.  Eat small, frequent meals instead of large meals.  Wear loose-fitting clothing. Do not wear anything tight around your waist that causes pressure on your stomach.  Raise the head of your bed 6 to 8 inches with wood blocks to help you sleep. Extra pillows will not help.  Only take over-the-counter or prescription medicines for pain, discomfort, or fever as directed by your caregiver.  Do not take aspirin, ibuprofen, or other nonsteroidal anti-inflammatory drugs (NSAIDs). SEEK IMMEDIATE MEDICAL CARE IF:   You have pain in your arms, neck, jaw, teeth, or back.  Your pain increases or changes in intensity or duration.  You develop nausea, vomiting, or sweating (diaphoresis).  You develop shortness of breath, or you faint.  Your vomit is green, yellow, black, or looks like coffee grounds or blood.  Your stool is red, bloody, or black. These symptoms could be signs of other problems, such as heart disease, gastric bleeding, or esophageal bleeding. MAKE SURE YOU:   Understand these instructions.  Will watch your condition.  Will get help right away if you are not doing well or get worse. Document Released: 11/03/2004 Document Revised: 04/18/2011 Document Reviewed: 08/13/2010 [facility] Patient Information 2015 [place], [place]. This information is not intended to replace advice given to you by your health care provider. Make sure you discuss any questions you have with your health care provider. Hypertension Hypertension, commonly called high blood pressure, is when the force of blood pumping through your arteries is too strong. Your arteries are the blood vessels that carry blood from your heart throughout your body. A blood pressure reading  consists of a higher number over a lower number, such as 110/72. The higher number (systolic) is the pressure inside your arteries when your heart pumps. The lower number (diastolic) is the pressure inside your arteries when your heart relaxes. Ideally you want your blood pressure below 120/80. Hypertension forces your heart to work harder to pump blood. Your arteries may become narrow or stiff. Having hypertension puts you at risk for heart disease, stroke, and other problems.  RISK FACTORS Some risk factors for high blood pressure are controllable. Others are not.  Risk factors you cannot control include:   Race. You may be at higher risk if you are African American.  Age. Risk increases with age.  Gender. Men are at higher risk than women before age 44 years. After age 44, women are at higher risk than men. Risk factors you can control include:  Not getting enough exercise or physical activity.  Being overweight.  Getting too much fat, sugar, calories, or salt in your diet.  Drinking too much alcohol. SIGNS AND SYMPTOMS Hypertension does not usually cause signs or symptoms. Extremely high blood pressure (hypertensive crisis) may cause headache, anxiety, shortness of breath, and nosebleed. DIAGNOSIS  To check if you have hypertension, your health care provider will measure your blood  pressure while you are seated, with your arm held at the level of your heart. It should be measured at least twice using the same arm. Certain conditions can cause a difference in blood pressure between your right and left arms. A blood pressure reading that is higher than normal on one occasion does not mean that you need treatment. If one blood pressure reading is high, ask your health care provider about having it checked again. TREATMENT  Treating high blood pressure includes making lifestyle changes and possibly taking medicine. Living a healthy lifestyle can help lower high blood pressure. You may need  to change some of your habits. Lifestyle changes may include:  Following the DASH diet. This diet is high in fruits, vegetables, and whole grains. It is low in salt, red meat, and added sugars.  Getting at least 2 hours of brisk physical activity every week.  Losing weight if necessary.  Not smoking.  Limiting alcoholic beverages.  Learning ways to reduce stress. If lifestyle changes are not enough to get your blood pressure under control, your health care provider may prescribe medicine. You may need to take more than one. Work closely with your health care provider to understand the risks and benefits. HOME CARE INSTRUCTIONS  Have your blood pressure rechecked as directed by your health care provider.   Take medicines only as directed by your health care provider. Follow the directions carefully. Blood pressure medicines must be taken as prescribed. The medicine does not work as well when you skip doses. Skipping doses also puts you at risk for problems.   Do not smoke.   Monitor your blood pressure at home as directed by your health care provider. SEEK MEDICAL CARE IF:   You think you are having a reaction to medicines taken.  You have recurrent headaches or feel dizzy.  You have swelling in your ankles.  You have trouble with your vision. SEEK IMMEDIATE MEDICAL CARE IF:  You develop a severe headache or confusion.  You have unusual weakness, numbness, or feel faint.  You have severe chest or abdominal pain.  You vomit repeatedly.  You have trouble breathing. MAKE SURE YOU:   Understand these instructions.  Will watch your condition.  Will get help right away if you are not doing well or get worse. Document Released: 01/24/2005 Document Revised: 06/10/2013 Document Reviewed: 11/16/2012 [facility] Patient Information 2015 [place], [place]. This information is not intended to replace advice given to you by your health care provider. Make sure you discuss any  questions you have with your health care provider.

## 2014-04-21 NOTE — Patient Instructions (Addendum)
Return here for blood pressure check in a month

## 2014-04-28 ENCOUNTER — Other Ambulatory Visit: Payer: Self-pay | Admitting: Family Medicine

## 2014-04-29 ENCOUNTER — Other Ambulatory Visit: Payer: Self-pay

## 2014-04-29 NOTE — Telephone Encounter (Signed)
Is this okay?

## 2014-04-29 NOTE — Telephone Encounter (Signed)
[place]

## 2014-05-20 ENCOUNTER — Telehealth: Payer: Self-pay | Admitting: Internal Medicine

## 2014-05-20 ENCOUNTER — Other Ambulatory Visit: Payer: Self-pay

## 2014-05-20 MED ORDER — HYDROCHLOROTHIAZIDE 25 MG PO TABS: 25.0000 mg | ORAL_TABLET | Freq: Every day | ORAL | Status: DC

## 2014-05-20 NOTE — Telephone Encounter (Signed)
[facility]

## 2014-05-20 NOTE — Telephone Encounter (Signed)
done

## 2014-06-12 ENCOUNTER — Other Ambulatory Visit: Payer: Self-pay | Admitting: Family Medicine

## 2014-06-12 NOTE — Telephone Encounter (Signed)
Is this okay to refill? 

## 2014-06-21 ENCOUNTER — Telehealth: Payer: Self-pay | Admitting: Family Medicine

## 2014-06-23 NOTE — Telephone Encounter (Signed)
Pt called back and states that she has lost around 15 pounds

## 2014-06-25 NOTE — Telephone Encounter (Signed)
Dr Susann GivensLalonde rcd form for [facility] and patient needs appt.  Called pt lmtrc.

## 2014-06-26 ENCOUNTER — Telehealth: Payer: Self-pay | Admitting: Family Medicine

## 2014-06-26 NOTE — Telephone Encounter (Signed)
Form was completed & faxed in and P.A. Belviq was approved til 06/25/15, left message for pt, faxed pharmacy

## 2014-07-22 ENCOUNTER — Other Ambulatory Visit: Payer: Self-pay | Admitting: Family Medicine

## 2014-07-23 ENCOUNTER — Telehealth: Payer: Self-pay | Admitting: Family Medicine

## 2014-07-23 ENCOUNTER — Other Ambulatory Visit: Payer: Self-pay

## 2014-07-23 MED ORDER — LORCASERIN HCL 10 MG PO TABS: 1.0000 | ORAL_TABLET | Freq: Two times a day (BID) | ORAL | Status: DC

## 2014-07-23 NOTE — Telephone Encounter (Signed)
Is this okay?

## 2014-07-23 NOTE — Telephone Encounter (Signed)
Pt wanted to let Dr Susann Givens know that she is not able to make an appt to come in to f/u on her Belviq until July due to work schedule. She has about a weeks worth of meds left right now. What does Dr Susann Givens suggest? Should she go off of meds when she run out until she can come in or what? Pt did not make an appt yet because she is not sure about work schedule right now

## 2014-07-23 NOTE — Telephone Encounter (Signed)
She needs a follow-up appointment sometime in July so give her enough to cover till she can come in for the appointment

## 2014-07-23 NOTE — Telephone Encounter (Signed)
I have called belviq in

## 2014-07-23 NOTE — Telephone Encounter (Signed)
She needs to come in for a visit so we can get await and see how she is doing. It has been 3 months

## 2014-09-16 ENCOUNTER — Other Ambulatory Visit: Payer: Self-pay | Admitting: Family Medicine

## 2014-10-20 ENCOUNTER — Other Ambulatory Visit (INDEPENDENT_AMBULATORY_CARE_PROVIDER_SITE_OTHER): Payer: BLUE CROSS/BLUE SHIELD

## 2014-10-20 DIAGNOSIS — Z23 Encounter for immunization: Secondary | ICD-10-CM | POA: Diagnosis not present

## 2014-10-30 ENCOUNTER — Other Ambulatory Visit: Payer: Self-pay | Admitting: Family Medicine

## 2014-10-31 ENCOUNTER — Telehealth: Payer: Self-pay | Admitting: Family Medicine

## 2014-10-31 MED ORDER — HYDROCHLOROTHIAZIDE 25 MG PO TABS: 25.0000 mg | ORAL_TABLET | Freq: Every day | ORAL | Status: DC

## 2014-10-31 NOTE — Telephone Encounter (Signed)
Rcvd a note from pharmacy stating that pt need #90 of Hydrochlorothiazide  per her insurance

## 2014-10-31 NOTE — Telephone Encounter (Signed)
done

## 2014-12-15 ENCOUNTER — Other Ambulatory Visit (HOSPITAL_COMMUNITY): Payer: Self-pay | Admitting: Neurological Surgery

## 2014-12-25 ENCOUNTER — Telehealth: Payer: Self-pay | Admitting: Family Medicine

## 2014-12-25 DIAGNOSIS — J0100 Acute maxillary sinusitis, unspecified: Secondary | ICD-10-CM

## 2014-12-25 MED ORDER — AMOXICILLIN-POT CLAVULANATE 875-125 MG PO TABS: 1.0000 | ORAL_TABLET | Freq: Two times a day (BID) | ORAL | Status: DC

## 2014-12-25 NOTE — Telephone Encounter (Signed)
Tell her that I sent him a new prescription for Augmentin. If she is not totally back to normal when she finishes it, call me

## 2014-12-25 NOTE — Telephone Encounter (Signed)
Pt says that she thinks that she has sinus infection again and would like meds for it. She is not able to get off work to come in for appt. She has head congestion that is starting to move in to her chest.blowing green stuff out of her nose. All of this is giving her a headache. She's had a headache since Monday, all other symptoms started Friday. She has been taking Severe Benadryl Allergy & Sinus

## 2014-12-29 NOTE — Telephone Encounter (Signed)
Pt has been taking meds and will let us know when finished if not completely better.

## 2015-01-08 ENCOUNTER — Encounter (HOSPITAL_COMMUNITY): Payer: Self-pay

## 2015-01-08 ENCOUNTER — Encounter (HOSPITAL_COMMUNITY)
Admission: RE | Admit: 2015-01-08 | Discharge: 2015-01-08 | Disposition: A | Discharge status: Home / Self Care | Payer: BLUE CROSS/BLUE SHIELD | Source: Ambulatory Visit | Attending: Neurological Surgery | Admitting: Neurological Surgery

## 2015-01-08 DIAGNOSIS — M5136 Other intervertebral disc degeneration, lumbar region: Secondary | ICD-10-CM

## 2015-01-08 DIAGNOSIS — Z01818 Encounter for other preprocedural examination: Secondary | ICD-10-CM | POA: Insufficient documentation

## 2015-01-08 DIAGNOSIS — Z01812 Encounter for preprocedural laboratory examination: Secondary | ICD-10-CM | POA: Diagnosis present

## 2015-01-08 HISTORY — DX: Major depressive disorder, single episode, unspecified: F32.9

## 2015-01-08 HISTORY — DX: Depression, unspecified: F32.A

## 2015-01-08 HISTORY — DX: Essential (primary) hypertension: I10

## 2015-01-08 HISTORY — DX: Anxiety disorder, unspecified: F41.9

## 2015-01-08 LAB — SURGICAL PCR SCREEN
MRSA, PCR: NEGATIVE
Staphylococcus aureus: NEGATIVE

## 2015-01-08 LAB — BASIC METABOLIC PANEL
ANION GAP: 10 (ref 5–15)
BUN: 9 mg/dL (ref 6–20)
CALCIUM: 9.7 mg/dL (ref 8.9–10.3)
CO2: 25 mmol/L (ref 22–32)
Chloride: 104 mmol/L (ref 101–111)
Creatinine, Ser: 0.75 mg/dL (ref 0.44–1.00)
GFR calc non Af Amer: >60 mL/min (ref >60)
GLUCOSE: 112 mg/dL — AB (ref 65–99)
POTASSIUM: 3.2 mmol/L — AB (ref 3.5–5.1)
Sodium: 139 mmol/L (ref 135–145)

## 2015-01-08 LAB — CBC WITH DIFFERENTIAL/PLATELET
BASOS ABS: 0 10*3/uL (ref 0.0–0.1)
BASOS PCT: 0 %
Eosinophils Absolute: 0 10*3/uL (ref 0.0–0.7)
Eosinophils Relative: 0 %
HEMATOCRIT: 40.5 % (ref 36.0–46.0)
HEMOGLOBIN: 13.6 g/dL (ref 12.0–15.0)
LYMPHS PCT: 22 %
Lymphs Abs: 1.5 10*3/uL (ref 0.7–4.0)
MCH: 31.1 pg (ref 26.0–34.0)
MCHC: 33.6 g/dL (ref 30.0–36.0)
MCV: 92.5 fL (ref 78.0–100.0)
MONO ABS: 0.3 10*3/uL (ref 0.1–1.0)
MONOS PCT: 4 %
NEUTROS ABS: 5.2 10*3/uL (ref 1.7–7.7)
NEUTROS PCT: 74 %
Platelets: 262 10*3/uL (ref 150–400)
RBC: 4.38 MIL/uL (ref 3.87–5.11)
RDW: 12.5 % (ref 11.5–15.5)
WBC: 7 10*3/uL (ref 4.0–10.5)

## 2015-01-08 LAB — HCG, SERUM, QUALITATIVE: Preg, Serum: NEGATIVE

## 2015-01-08 NOTE — Pre-Procedure Instructions (Signed)
    Tiburcio PeaLaurie T Tregoning  01/08/2015      [id number] [place], [place] - [address] [place] [id number] Phone: [phone] Fax: [phone]  [facility] [place], [place] - [id number] [place] [address] Phone: [phone] Fax: [phone]  [facility] [place], [place] - [id number] AT PORTAL TO [facility] [address] [place] [id number] Phone: [phone] Fax: [phone]    Your procedure is scheduled on 01/15/15.  [facility].M.  Call this number if you have problems the morning of surgery:  [phone]   Remember:  Do not eat food or drink liquids after midnight.  Take these medicines the morning of surgery with A SIP OF WATER --synthroid,protonix,   Do not wear jewelry, make-up or nail polish.  Do not wear lotions, powders, or perfumes.  You may wear deodorant.  Do not shave 48 hours prior to surgery.  Men may shave face and neck.  Do not bring valuables to the hospital.  [facility] is not responsible for any belongings or valuables.  Contacts, dentures or bridgework may not be worn into surgery.  Leave your suitcase in the car.  After surgery it may be brought to your room.  For patients admitted to the hospital, discharge time will be determined by your treatment team.  Patients discharged the day of surgery will not be allowed to drive home.   Name and phone number of your driver:   Special instructions:   Please read over the following fact sheets that you were given. Pain Booklet, Coughing and Deep Breathing, Blood Transfusion Information, MRSA Information and Surgical Site Infection Prevention

## 2015-01-14 MED ORDER — CEFAZOLIN SODIUM-DEXTROSE 2-3 GM-% IV SOLR
2.0000 g | INTRAVENOUS | Status: AC
  Administered 2015-01-15: 2 g via INTRAVENOUS
  Filled 2015-01-14: qty 50

## 2015-01-14 MED ORDER — DEXAMETHASONE SODIUM PHOSPHATE 10 MG/ML IJ SOLN
10.0000 mg | INTRAMUSCULAR | Status: AC
  Administered 2015-01-15: 10 mg via INTRAVENOUS
  Filled 2015-01-14: qty 1

## 2015-01-15 ENCOUNTER — Encounter (HOSPITAL_COMMUNITY): Payer: Self-pay

## 2015-01-15 ENCOUNTER — Encounter (HOSPITAL_COMMUNITY)
Admission: RE | Disposition: A | Discharge status: Home / Self Care | Payer: Self-pay | Source: Ambulatory Visit | Attending: Neurological Surgery

## 2015-01-15 ENCOUNTER — Inpatient Hospital Stay (HOSPITAL_COMMUNITY): Payer: BLUE CROSS/BLUE SHIELD | Admitting: Anesthesiology

## 2015-01-15 ENCOUNTER — Inpatient Hospital Stay (HOSPITAL_COMMUNITY)
Admission: RE | Admit: 2015-01-15 | Discharge: 2015-01-16 | DRG: 460 | Disposition: A | Discharge status: Home / Self Care | Payer: BLUE CROSS/BLUE SHIELD | Source: Ambulatory Visit | Attending: Neurological Surgery | Admitting: Neurological Surgery

## 2015-01-15 ENCOUNTER — Inpatient Hospital Stay (HOSPITAL_COMMUNITY): Payer: BLUE CROSS/BLUE SHIELD

## 2015-01-15 DIAGNOSIS — Z79899 Other long term (current) drug therapy: Secondary | ICD-10-CM

## 2015-01-15 DIAGNOSIS — F319 Bipolar disorder, unspecified: Secondary | ICD-10-CM | POA: Diagnosis present

## 2015-01-15 DIAGNOSIS — M4807 Spinal stenosis, lumbosacral region: Secondary | ICD-10-CM | POA: Diagnosis present

## 2015-01-15 DIAGNOSIS — Z419 Encounter for procedure for purposes other than remedying health state, unspecified: Secondary | ICD-10-CM

## 2015-01-15 DIAGNOSIS — Z87891 Personal history of nicotine dependence: Secondary | ICD-10-CM

## 2015-01-15 DIAGNOSIS — E039 Hypothyroidism, unspecified: Secondary | ICD-10-CM | POA: Diagnosis present

## 2015-01-15 DIAGNOSIS — M549 Dorsalgia, unspecified: Secondary | ICD-10-CM | POA: Diagnosis present

## 2015-01-15 DIAGNOSIS — M5117 Intervertebral disc disorders with radiculopathy, lumbosacral region: Principal | ICD-10-CM | POA: Diagnosis present

## 2015-01-15 DIAGNOSIS — I10 Essential (primary) hypertension: Secondary | ICD-10-CM | POA: Diagnosis present

## 2015-01-15 DIAGNOSIS — Z981 Arthrodesis status: Secondary | ICD-10-CM

## 2015-01-15 HISTORY — PX: MAXIMUM ACCESS (MAS)POSTERIOR LUMBAR INTERBODY FUSION (PLIF) 1 LEVEL: SHX6368

## 2015-01-15 HISTORY — PX: LUMBAR FUSION: SHX111

## 2015-01-15 SURGERY — FOR MAXIMUM ACCESS (MAS) POSTERIOR LUMBAR INTERBODY FUSION (PLIF) 1 LEVEL
Anesthesia: General | Site: Back

## 2015-01-15 MED ORDER — PROPOFOL 500 MG/50ML IV EMUL
INTRAVENOUS | Status: DC | PRN
Start: 2015-01-15 — End: 2015-01-15
  Administered 2015-01-15: 50 ug/kg/min via INTRAVENOUS

## 2015-01-15 MED ORDER — THROMBIN 20000 UNITS EX SOLR
CUTANEOUS | Status: DC | PRN
  Administered 2015-01-15: 07:00:00 via TOPICAL

## 2015-01-15 MED ORDER — METHOCARBAMOL 500 MG PO TABS
500.0000 mg | ORAL_TABLET | Freq: Four times a day (QID) | ORAL | Status: DC | PRN
  Administered 2015-01-15 – 2015-01-16 (×3): 500 mg via ORAL
  Filled 2015-01-15 (×5): qty 1

## 2015-01-15 MED ORDER — PROPOFOL 10 MG/ML IV BOLUS
INTRAVENOUS | Status: AC
  Filled 2015-01-15: qty 20

## 2015-01-15 MED ORDER — DEXTROSE 5 % IV SOLN
10.0000 mg | INTRAVENOUS | Status: DC | PRN
  Administered 2015-01-15: 10 ug/min via INTRAVENOUS

## 2015-01-15 MED ORDER — DEXAMETHASONE SODIUM PHOSPHATE 4 MG/ML IJ SOLN
4.0000 mg | Freq: Four times a day (QID) | INTRAMUSCULAR | Status: DC
  Administered 2015-01-15: 4 mg via INTRAVENOUS
  Filled 2015-01-15: qty 1

## 2015-01-15 MED ORDER — TAPENTADOL HCL 50 MG PO TABS
75.0000 mg | ORAL_TABLET | Freq: Four times a day (QID) | ORAL | Status: DC | PRN
  Administered 2015-01-15 – 2015-01-16 (×3): 75 mg via ORAL
  Filled 2015-01-15 (×3): qty 2

## 2015-01-15 MED ORDER — PROPOFOL 10 MG/ML IV BOLUS
INTRAVENOUS | Status: DC | PRN
  Administered 2015-01-15: 180 mg via INTRAVENOUS

## 2015-01-15 MED ORDER — ARTIFICIAL TEARS OP OINT
TOPICAL_OINTMENT | OPHTHALMIC | Status: DC | PRN
  Administered 2015-01-15: 1 via OPHTHALMIC

## 2015-01-15 MED ORDER — FENTANYL CITRATE (PF) 100 MCG/2ML IJ SOLN
50.0000 ug | INTRAMUSCULAR | Status: DC | PRN
  Administered 2015-01-15 (×3): 50 ug via INTRAVENOUS

## 2015-01-15 MED ORDER — SENNA 8.6 MG PO TABS
1.0000 | ORAL_TABLET | Freq: Two times a day (BID) | ORAL | Status: DC
  Administered 2015-01-15 – 2015-01-16 (×3): 8.6 mg via ORAL
  Filled 2015-01-15 (×3): qty 1

## 2015-01-15 MED ORDER — MORPHINE SULFATE (PF) 2 MG/ML IV SOLN
INTRAVENOUS | Status: AC
  Administered 2015-01-15: 2 mg via INTRAVENOUS
  Filled 2015-01-15: qty 2

## 2015-01-15 MED ORDER — BUPIVACAINE HCL (PF) 0.25 % IJ SOLN
INTRAMUSCULAR | Status: DC | PRN
  Administered 2015-01-15: 5 mL

## 2015-01-15 MED ORDER — LEVOTHYROXINE SODIUM 50 MCG PO TABS
50.0000 ug | ORAL_TABLET | Freq: Every day | ORAL | Status: DC
  Administered 2015-01-15: 50 ug via ORAL
  Filled 2015-01-15: qty 1

## 2015-01-15 MED ORDER — MORPHINE SULFATE (PF) 2 MG/ML IV SOLN: 1.0000 mg | INTRAVENOUS | Status: DC | PRN

## 2015-01-15 MED ORDER — SODIUM CHLORIDE 0.9 % IV SOLN: 250.0000 mL | INTRAVENOUS | Status: DC

## 2015-01-15 MED ORDER — FENTANYL CITRATE (PF) 100 MCG/2ML IJ SOLN
INTRAMUSCULAR | Status: DC | PRN
  Administered 2015-01-15 (×5): 50 ug via INTRAVENOUS

## 2015-01-15 MED ORDER — MORPHINE SULFATE (PF) 2 MG/ML IV SOLN
INTRAVENOUS | Status: AC
Start: 2015-01-15 — End: 2015-01-15
  Administered 2015-01-15: 2 mg via INTRAVENOUS
  Filled 2015-01-15: qty 2

## 2015-01-15 MED ORDER — LAMOTRIGINE 100 MG PO TABS
300.0000 mg | ORAL_TABLET | Freq: Every day | ORAL | Status: DC
  Administered 2015-01-15: 300 mg via ORAL
  Filled 2015-01-15: qty 3

## 2015-01-15 MED ORDER — DEXAMETHASONE 4 MG PO TABS
4.0000 mg | ORAL_TABLET | Freq: Four times a day (QID) | ORAL | Status: DC
  Administered 2015-01-15 – 2015-01-16 (×4): 4 mg via ORAL
  Filled 2015-01-15 (×4): qty 1

## 2015-01-15 MED ORDER — DEXTROSE 5 % IV SOLN
500.0000 mg | Freq: Four times a day (QID) | INTRAVENOUS | Status: DC | PRN
  Administered 2015-01-15: 500 mg via INTRAVENOUS
  Filled 2015-01-15 (×2): qty 5

## 2015-01-15 MED ORDER — MENTHOL 3 MG MT LOZG: 1.0000 | LOZENGE | OROMUCOSAL | Status: DC | PRN

## 2015-01-15 MED ORDER — LACTATED RINGERS IV SOLN
INTRAVENOUS | Status: DC | PRN
  Administered 2015-01-15 (×2): via INTRAVENOUS

## 2015-01-15 MED ORDER — LIDOCAINE HCL (CARDIAC) 20 MG/ML IV SOLN
INTRAVENOUS | Status: DC | PRN
  Administered 2015-01-15: 60 mg via INTRAVENOUS

## 2015-01-15 MED ORDER — SUCCINYLCHOLINE CHLORIDE 20 MG/ML IJ SOLN
INTRAMUSCULAR | Status: DC | PRN
  Administered 2015-01-15: 120 mg via INTRAVENOUS

## 2015-01-15 MED ORDER — SODIUM CHLORIDE 0.9 % IJ SOLN: 3.0000 mL | INTRAMUSCULAR | Status: DC | PRN

## 2015-01-15 MED ORDER — SCOPOLAMINE 1 MG/3DAYS TD PT72
MEDICATED_PATCH | TRANSDERMAL | Status: DC | PRN
  Administered 2015-01-15: 1 via TRANSDERMAL

## 2015-01-15 MED ORDER — FENTANYL CITRATE (PF) 250 MCG/5ML IJ SOLN
INTRAMUSCULAR | Status: AC
  Filled 2015-01-15: qty 5

## 2015-01-15 MED ORDER — VANCOMYCIN HCL 1000 MG IV SOLR
INTRAVENOUS | Status: DC | PRN
  Administered 2015-01-15: 1000 mg

## 2015-01-15 MED ORDER — CEFAZOLIN SODIUM 1-5 GM-% IV SOLN
1.0000 g | Freq: Three times a day (TID) | INTRAVENOUS | Status: AC
  Administered 2015-01-15 (×2): 1 g via INTRAVENOUS
  Filled 2015-01-15 (×2): qty 50

## 2015-01-15 MED ORDER — CELECOXIB 200 MG PO CAPS
200.0000 mg | ORAL_CAPSULE | Freq: Two times a day (BID) | ORAL | Status: DC
  Administered 2015-01-15 – 2015-01-16 (×3): 200 mg via ORAL
  Filled 2015-01-15 (×3): qty 1

## 2015-01-15 MED ORDER — SODIUM CHLORIDE 0.9 % IR SOLN
Status: DC | PRN
  Administered 2015-01-15: 07:00:00

## 2015-01-15 MED ORDER — MIDAZOLAM HCL 2 MG/2ML IJ SOLN
INTRAMUSCULAR | Status: AC
  Filled 2015-01-15: qty 2

## 2015-01-15 MED ORDER — THROMBIN 5000 UNITS EX SOLR
OROMUCOSAL | Status: DC | PRN
  Administered 2015-01-15: 08:00:00 via TOPICAL

## 2015-01-15 MED ORDER — ONDANSETRON HCL 4 MG/2ML IJ SOLN
INTRAMUSCULAR | Status: AC
  Filled 2015-01-15: qty 2

## 2015-01-15 MED ORDER — FENTANYL CITRATE (PF) 100 MCG/2ML IJ SOLN
INTRAMUSCULAR | Status: AC
  Filled 2015-01-15: qty 2

## 2015-01-15 MED ORDER — PROMETHAZINE HCL 25 MG/ML IJ SOLN: 6.2500 mg | INTRAMUSCULAR | Status: DC | PRN

## 2015-01-15 MED ORDER — ONDANSETRON HCL 4 MG/2ML IJ SOLN
4.0000 mg | INTRAMUSCULAR | Status: DC | PRN
Start: 2015-01-15 — End: 2015-01-16

## 2015-01-15 MED ORDER — MIDAZOLAM HCL 5 MG/5ML IJ SOLN
INTRAMUSCULAR | Status: DC | PRN
  Administered 2015-01-15: 2 mg via INTRAVENOUS

## 2015-01-15 MED ORDER — ROCURONIUM BROMIDE 50 MG/5ML IV SOLN
INTRAVENOUS | Status: AC
  Filled 2015-01-15: qty 1

## 2015-01-15 MED ORDER — POTASSIUM CHLORIDE IN NACL 20-0.9 MEQ/L-% IV SOLN
INTRAVENOUS | Status: DC
  Administered 2015-01-15: 17:00:00 via INTRAVENOUS
  Filled 2015-01-15 (×4): qty 1000

## 2015-01-15 MED ORDER — HYDROCHLOROTHIAZIDE 25 MG PO TABS
25.0000 mg | ORAL_TABLET | Freq: Every day | ORAL | Status: DC
  Administered 2015-01-15 – 2015-01-16 (×2): 25 mg via ORAL
  Filled 2015-01-15 (×2): qty 1

## 2015-01-15 MED ORDER — SODIUM CHLORIDE 0.9 % IJ SOLN
3.0000 mL | Freq: Two times a day (BID) | INTRAMUSCULAR | Status: DC
  Administered 2015-01-15 – 2015-01-16 (×2): 3 mL via INTRAVENOUS

## 2015-01-15 MED ORDER — VANCOMYCIN HCL 1000 MG IV SOLR
INTRAVENOUS | Status: AC
  Filled 2015-01-15: qty 1000

## 2015-01-15 MED ORDER — PHENOL 1.4 % MT LIQD: 1.0000 | OROMUCOSAL | Status: DC | PRN

## 2015-01-15 MED ORDER — HYOSCYAMINE SULFATE 0.125 MG SL SUBL
0.1250 mg | SUBLINGUAL_TABLET | SUBLINGUAL | Status: DC | PRN
  Filled 2015-01-15: qty 1

## 2015-01-15 MED ORDER — MORPHINE SULFATE (PF) 2 MG/ML IV SOLN
1.0000 mg | INTRAVENOUS | Status: DC | PRN
  Administered 2015-01-15 (×6): 2 mg via INTRAVENOUS

## 2015-01-15 MED ORDER — ONDANSETRON HCL 4 MG/2ML IJ SOLN
INTRAMUSCULAR | Status: DC | PRN
  Administered 2015-01-15: 4 mg via INTRAVENOUS

## 2015-01-15 MED ORDER — PHENYLEPHRINE HCL 10 MG/ML IJ SOLN
INTRAMUSCULAR | Status: DC | PRN
  Administered 2015-01-15 (×2): 40 ug via INTRAVENOUS

## 2015-01-15 MED ORDER — LIDOCAINE HCL (CARDIAC) 20 MG/ML IV SOLN
INTRAVENOUS | Status: AC
  Filled 2015-01-15: qty 5

## 2015-01-15 MED ORDER — FENTANYL CITRATE (PF) 100 MCG/2ML IJ SOLN
INTRAMUSCULAR | Status: AC
  Administered 2015-01-15: 50 ug via INTRAVENOUS
  Filled 2015-01-15: qty 2

## 2015-01-15 SURGICAL SUPPLY — 64 items
APL SKNCLS STERI-STRIP NONHPOA (GAUZE/BANDAGES/DRESSINGS) ×1
BAG DECANTER FOR FLEXI CONT (MISCELLANEOUS) ×2 IMPLANT
BENZOIN TINCTURE PRP APPL 2/3 (GAUZE/BANDAGES/DRESSINGS) ×2 IMPLANT
BLADE CLIPPER SURG (BLADE) IMPLANT
BONE CANC CHIPS 20CC PCAN1/4 (Bone Implant) ×2 IMPLANT
BUR MATCHSTICK NEURO 3.0 LAGG (BURR) ×2 IMPLANT
CAGE COROENT MP 8X23 (Cage) ×2 IMPLANT
CANISTER SUCT 3000ML PPV (MISCELLANEOUS) ×2 IMPLANT
CHIPS CANC BONE 20CC PCAN1/4 (Bone Implant) ×1 IMPLANT
CLIP NEUROVISION LG (CLIP) ×1 IMPLANT
CONT SPEC 4OZ CLIKSEAL STRL BL (MISCELLANEOUS) ×2 IMPLANT
COVER BACK TABLE 24X17X13 BIG (DRAPES) IMPLANT
COVER BACK TABLE 60X90IN (DRAPES) ×2 IMPLANT
DRAPE C-ARM 42X72 X-RAY (DRAPES) ×2 IMPLANT
DRAPE C-ARMOR (DRAPES) ×2 IMPLANT
DRAPE LAPAROTOMY 100X72X124 (DRAPES) ×2 IMPLANT
DRAPE POUCH INSTRU U-SHP 10X18 (DRAPES) ×2 IMPLANT
DRAPE SURG 17X23 STRL (DRAPES) ×2 IMPLANT
DRSG OPSITE POSTOP 4X6 (GAUZE/BANDAGES/DRESSINGS) ×1 IMPLANT
DURAPREP 26ML APPLICATOR (WOUND CARE) ×2 IMPLANT
ELECT REM PT RETURN 9FT ADLT (ELECTROSURGICAL) ×2
ELECTRODE REM PT RTRN 9FT ADLT (ELECTROSURGICAL) ×1 IMPLANT
EVACUATOR 1/8 PVC DRAIN (DRAIN) ×2 IMPLANT
GAUZE SPONGE 4X4 16PLY XRAY LF (GAUZE/BANDAGES/DRESSINGS) IMPLANT
GLOVE BIO SURGEON STRL SZ 6.5 (GLOVE) ×1 IMPLANT
GLOVE BIO SURGEON STRL SZ8 (GLOVE) ×4 IMPLANT
GLOVE INDICATOR 7.0 STRL GRN (GLOVE) ×1 IMPLANT
GLOVE INDICATOR 7.5 STRL GRN (GLOVE) ×3 IMPLANT
GLOVE SURG SS PI 7.0 STRL IVOR (GLOVE) ×1 IMPLANT
GOWN STRL REUS W/ TWL LRG LVL3 (GOWN DISPOSABLE) IMPLANT
GOWN STRL REUS W/ TWL XL LVL3 (GOWN DISPOSABLE) ×2 IMPLANT
GOWN STRL REUS W/TWL 2XL LVL3 (GOWN DISPOSABLE) IMPLANT
GOWN STRL REUS W/TWL LRG LVL3 (GOWN DISPOSABLE) ×2
GOWN STRL REUS W/TWL XL LVL3 (GOWN DISPOSABLE) ×6
GRAFT BNE CANC CHIPS 1-8 20CC (Bone Implant) IMPLANT
HEMOSTAT POWDER KIT SURGIFOAM (HEMOSTASIS) ×1 IMPLANT
KIT BASIN OR (CUSTOM PROCEDURE TRAY) ×2 IMPLANT
KIT ROOM TURNOVER OR (KITS) ×2 IMPLANT
MILL MEDIUM DISP (BLADE) ×1 IMPLANT
MODULE NVM5 NEXT GEN EMG (NEEDLE) ×1 IMPLANT
NDL HYPO 25X1 1.5 SAFETY (NEEDLE) ×1 IMPLANT
NEEDLE HYPO 25X1 1.5 SAFETY (NEEDLE) ×2 IMPLANT
NS IRRIG 1000ML POUR BTL (IV SOLUTION) ×2 IMPLANT
PACK LAMINECTOMY NEURO (CUSTOM PROCEDURE TRAY) ×2 IMPLANT
PAD ARMBOARD 7.5X6 YLW CONV (MISCELLANEOUS) ×6 IMPLANT
ROD 35MM (Rod) ×2 IMPLANT
SCREW LOCK (Screw) ×8 IMPLANT
SCREW LOCK FXNS SPNE MAS PL (Screw) IMPLANT
SCREW SHANK 5.0X30MM (Screw) ×2 IMPLANT
SCREW SHANK 6.5X65 (Screw) ×2 IMPLANT
SCREW TULIP 5.5 (Screw) ×4 IMPLANT
SPONGE LAP 4X18 X RAY DECT (DISPOSABLE) IMPLANT
SPONGE SURGIFOAM ABS GEL 100 (HEMOSTASIS) ×2 IMPLANT
STRIP CLOSURE SKIN 1/2X4 (GAUZE/BANDAGES/DRESSINGS) ×3 IMPLANT
SUT VIC AB 0 CT1 18XCR BRD8 (SUTURE) ×1 IMPLANT
SUT VIC AB 0 CT1 8-18 (SUTURE) ×2
SUT VIC AB 2-0 CP2 18 (SUTURE) ×2 IMPLANT
SUT VIC AB 3-0 SH 8-18 (SUTURE) ×4 IMPLANT
SYR 3ML LL SCALE MARK (SYRINGE) IMPLANT
TOWEL OR 17X24 6PK STRL BLUE (TOWEL DISPOSABLE) ×2 IMPLANT
TOWEL OR 17X26 10 PK STRL BLUE (TOWEL DISPOSABLE) ×2 IMPLANT
TRAP SPECIMEN MUCOUS 40CC (MISCELLANEOUS) ×1 IMPLANT
TRAY FOLEY W/METER SILVER 14FR (SET/KITS/TRAYS/PACK) ×2 IMPLANT
WATER STERILE IRR 1000ML POUR (IV SOLUTION) ×2 IMPLANT

## 2015-01-15 NOTE — Anesthesia Postprocedure Evaluation (Signed)
Anesthesia Post Note  Patient: Yolanda Weaver  Procedure(s) Performed: Procedure(s) (LRB): LUMBAR FIVE-SACRA L-ONE MAXIMUM ACCESS SURGERY POSTERIOR LUMBAR INTERBODY FUSION  (N/A)  Patient location during evaluation: PACU Anesthesia Type: General Level of consciousness: awake and alert Pain management: pain level controlled Vital Signs Assessment: post-procedure vital signs reviewed and stable Respiratory status: spontaneous breathing and respiratory function stable Cardiovascular status: blood pressure returned to baseline and stable Postop Assessment: no signs of nausea or vomiting Anesthetic complications: no    Last Vitals:  Filed Vitals:   01/15/15 1100 01/15/15 1115  BP: 132/78 141/87  Pulse: 87 85  Temp:    Resp: 13 16    Last Pain:  Filed Vitals:   01/15/15 1117  PainSc: 10-Worst pain ever                 Kasem Mozer S

## 2015-01-15 NOTE — Progress Notes (Signed)
Pt arrived from the pacu at 1305 via stretcher; IV intact and transfusing; VSS: pt oriented to the unit and room; back incision dsg clean and dry with scant sanguinous stain [facility]; neuro wnl; fall precaution and prevention education completed with pt; pt voices understanding and denies any questions. Foley intact and unclamped; pt in bed comfortably with call light within reach. Will closely monitor pt. Dionne BucyP. Amo Gardner Servantes RN

## 2015-01-15 NOTE — Transfer of Care (Signed)
Immediate Anesthesia Transfer of Care Note  Patient: Yolanda PeaLaurie T Cullars  Procedure(s) Performed: Procedure(s): LUMBAR FIVE-SACRA L-ONE MAXIMUM ACCESS SURGERY POSTERIOR LUMBAR INTERBODY FUSION  (N/A)  Patient Location: PACU  Anesthesia Type:General  Level of Consciousness: awake, alert  and oriented  Airway & Oxygen Therapy: Patient Spontanous Breathing and Patient connected to face mask oxygen  Post-op Assessment: Report given to RN and Post -op Vital signs reviewed and stable  Post vital signs: Reviewed and stable  Last Vitals:  Filed Vitals:   01/15/15 0615  BP: 108/86  Temp: 37 C  Resp: 20    Complications: No apparent anesthesia complications

## 2015-01-15 NOTE — Evaluation (Signed)
Physical Therapy Evaluation Patient Details Name: Yolanda Weaver MRN: [id number] DOB: 11-Apr-1970 Today's Date: 01/15/2015   History of Present Illness  44 y.o. female with hx of HTN, depression, anxiety, and bipolar disorder. S/p LUMBAR FIVE-SACRAL-ONE POSTERIOR LUMBAR INTERBODY FUSION.  Clinical Impression  Patient is seen following the above procedure, presenting  functional limitations due to the deficits listed below (see PT Problem List). Tolerated mobility and gait training well today with very minimal assistance. Educated on safety. Anticipate quick progression towards functional goals. Follow-up for stair training tomorrow. Patient will benefit from skilled PT to increase their independence and safety with mobility to allow discharge to the venue listed below.       Follow Up Recommendations No PT follow up;Supervision - Intermittent    Equipment Recommendations  None recommended by PT    Recommendations for Other Services       Precautions / Restrictions Precautions Precautions: Back Precaution Booklet Issued: Yes (comment) Precaution Comments: reviewed handout Required Braces or Orthoses: Spinal Brace Spinal Brace: Lumbar corset;Applied in sitting position Restrictions Weight Bearing Restrictions: No      Mobility  Bed Mobility Overal bed mobility: Needs Assistance Bed Mobility: Rolling;Sidelying to Sit Rolling: Supervision Sidelying to sit: Supervision       General bed mobility comments: Educated on log roll technique. Used rail but no physical assist today  Transfers Overall transfer level: Needs assistance Equipment used: 1 person hand held assist Transfers: Sit to/from Stand Sit to Stand: Min assist         General transfer comment: Min assist for hand held support to rise from bed. VC for technique and to maintain back precautions.  Ambulation/Gait Ambulation/Gait assistance: Min assist Ambulation Distance (Feet): 150 Feet Assistive device: 1  person hand held assist Gait Pattern/deviations: Step-through pattern;Decreased stride length Gait velocity: decreased Gait velocity interpretation: Below normal speed for age/gender General Gait Details: Min assist for hand held stability only, pushing IV pole. No overt loss of balance noted. Cues for safety with straight path gait turns. No buckling or antalgic gait pattern noted.   Stairs            Wheelchair Mobility    Modified Rankin (Stroke Patients Only)       Balance Overall balance assessment: Needs assistance Sitting-balance support: Feet supported;No upper extremity supported Sitting balance-Leahy Scale: Normal     Standing balance support: No upper extremity supported Standing balance-Leahy Scale: Good                               Pertinent Vitals/Pain Pain Assessment: 0-10 Pain Score: 8  Pain Location: back, RLE to knee Pain Descriptors / Indicators: Aching Pain Intervention(s): Monitored during session;Repositioned    Home Living Family/patient expects to be discharged to:: Private residence Living Arrangements: Spouse/significant other;Children (staying with mother and father) Available Help at Discharge: Family;Available 24 hours/day Type of Home: House Home Access: Stairs to enter Entrance Stairs-Rails: Left Entrance Stairs-Number of Steps: 1 Home Layout: One level Home Equipment: None      Prior Function Level of Independence: Independent               Hand Dominance   Dominant Hand: Right    Extremity/Trunk Assessment   Upper Extremity Assessment: Defer to OT evaluation           Lower Extremity Assessment: Overall WFL for tasks assessed         Communication   Communication:  No difficulties  Cognition Arousal/Alertness: Awake/alert Behavior During Therapy: WFL for tasks assessed/performed Overall Cognitive Status: Within Functional Limits for tasks assessed                      General  Comments General comments (skin integrity, edema, etc.): Educated on back brace use and precautions. Family present and supportive.    Exercises        Assessment/Plan    PT Assessment Patient needs continued PT services  PT Diagnosis Abnormality of gait;Acute pain   PT Problem List Decreased range of motion;Decreased activity tolerance;Decreased balance;Decreased mobility;Decreased knowledge of use of DME;Decreased knowledge of precautions;Pain  PT Treatment Interventions DME instruction;Gait training;Stair training;Functional mobility training;Therapeutic activities;Therapeutic exercise;Balance training;Neuromuscular re-education;Patient/family education;Modalities   PT Goals (Current goals can be found in the Care Plan section) Acute Rehab PT Goals Patient Stated Goal: No pain PT Goal Formulation: With patient Time For Goal Achievement: 01/29/15 Potential to Achieve Goals: Good    Frequency Min 5X/week   Barriers to discharge        Co-evaluation               End of Session Equipment Utilized During Treatment: Back brace Activity Tolerance: Patient tolerated treatment well Patient left: in chair;with call bell/phone within reach;with family/visitor present Nurse Communication: Mobility status         Time: [id number] PT Time Calculation (min) (ACUTE ONLY): 27 min   Charges:   PT Evaluation $Initial PT Evaluation Tier I: 1 Procedure PT Treatments $Gait Training: 8-22 mins   PT G CodesBerton Mount:        Ronalda Walpole S 01/15/2015, 5:33 PM Sunday [place] [place], [place] [phone]

## 2015-01-15 NOTE — Care Management Note (Signed)
Case Management Note  Patient Details  Name: Yolanda Weaver MRN: [id number] Date of Birth: 1971/01/21  Subjective/Objective:                    Action/Plan: Patient was admitted for a PLIF. Lives at home with spouse. Will follow for discharge needs pending PT/OT evals and physician orders.  Expected Discharge Date:                  Expected Discharge Plan:  Home/Self Care  In-House Referral:     Discharge planning Services     Post Acute Care Choice:    Choice offered to:     DME Arranged:    DME Agency:     HH Arranged:    HH Agency:     Status of Service:  In process, will continue to follow  Medicare Important Message Given:    Date Medicare IM Given:    Medicare IM give by:    Date Additional Medicare IM Given:    Additional Medicare Important Message give by:     If discussed at Long Length of Stay Meetings, dates discussed:    Additional Comments:  Anda KraftRobarge, Howard Patton C, RN 01/15/2015, 2:02 PM

## 2015-01-15 NOTE — Anesthesia Procedure Notes (Signed)
Procedure Name: Intubation Date/Time: 01/15/2015 7:38 AM Performed by: Dairl PonderJIANG, Adilynn Bessey Pre-anesthesia Checklist: Patient identified, Timeout performed, Emergency Drugs available, Suction available and Patient being monitored Patient Re-evaluated:Patient Re-evaluated prior to inductionOxygen Delivery Method: Circle system utilized Preoxygenation: Pre-oxygenation with 100% oxygen Intubation Type: IV induction Ventilation: Mask ventilation without difficulty Laryngoscope Size: Mac and 3 Grade View: Grade I Tube type: Oral Tube size: 7.0 mm Number of attempts: 1 Airway Equipment and Method: Stylet Placement Confirmation: ETT inserted through vocal cords under direct vision,  breath sounds checked- equal and bilateral and positive ETCO2 Secured at: 22 cm Tube secured with: Tape Dental Injury: Teeth and Oropharynx as per pre-operative assessment

## 2015-01-15 NOTE — H&P (Signed)
Subjective: Patient is a 44 y.o. female admitted for back and leg pain. Onset of symptoms was  Yrs ago, she is s/p microdiskectomy by another Careers adviser. Worse since that time.  The pain is rated severe, and is located at the low back and radiates to legs. The pain is described as aching and occurs all day. The symptoms have been progressive. Symptoms are exacerbated by activity. MRI or CT showed DDD L5-S1.   Past Medical History  Diagnosis Date  . Acne   . Contraception   . Bipolar 1 disorder (HCC)   . Migraine   . Ptosis, right eyelid   . Allergy   . Hypothyroid   . PONV (postoperative nausea and vomiting)   . Blood transfusion     h/o tx for rh incompatibility during pregnancy. see note re significant antibody  . Mental disorder     bipolar  . Recurrent upper respiratory infection (URI)     reports she had bronchitis, treated /w antibiotics in past month  . Obesity   . Anxiety   . Depression   . Hypertension     Past Surgical History  Procedure Laterality Date  . Knee arthroscopy      x3 r  . Diagnostic laparoscopy      evaluation for endometriosis  . Thyroidectomy, partial    . Lumbar laminectomy  04/08/2011    Procedure: MICRODISCECTOMY LUMBAR LAMINECTOMY;  Surgeon: Eldred Manges, MD;  Location: [facility] OR;  Service: Orthopedics;  Laterality: Right;  Right L5-S1 Microdiscectomy  . Foot foreign body removal      Prior to Admission medications   Medication Sig Start Date End Date Taking? Authorizing Provider  amoxicillin-clavulanate (AUGMENTIN) 875-125 MG tablet Take 1 tablet by mouth 2 (two) times daily. Patient taking differently: Take 1 tablet by mouth 2 (two) times daily. For 10 days 12/25/14  Yes Ronnald Nian, MD  Diphenhydramine-PE-APAP [facility] SEVERE ALLERGY & SINUS PO) Take 1 tablet by mouth as needed (for allergies and sinuses).   Yes Historical Provider, MD  Diphenhydramine-PE-APAP 25-5-325 MG TABS Take 1 tablet by mouth every 8 (eight) hours as needed (sinus).   Yes  Historical Provider, MD  hydrochlorothiazide (HYDRODIURIL) 25 MG tablet Take 1 tablet (25 mg total) by mouth daily. 10/31/14  Yes Ronnald Nian, MD  hyoscyamine (LEVSIN/SL) 0.125 MG SL tablet Place 1 tablet (0.125 mg total) under the tongue every 4 (four) hours as needed for cramping. 07/09/13  Yes Ronnald Nian, MD  ibuprofen (ADVIL,MOTRIN) 800 MG tablet TAKE 1 TABLET BY MOUTH EVERY 8 HOURS AS NEEDED WITH FOOD Patient taking differently: TAKE 800 MG BY MOUTH EVERY 8 HOURS AS NEEDED FOR PAIN. TAKE WITH FOOD 02/13/13  Yes Delories Heinz, DPM  lamoTRIgine (LAMICTAL) 200 MG tablet Take 1.5 tablets (300 mg total) by mouth at bedtime. 04/10/14  Yes Ronnald Nian, MD  levothyroxine (SYNTHROID, LEVOTHROID) 50 MCG tablet Take 1 tablet (50 mcg total) by mouth at bedtime. 03/31/14  Yes Ronnald Nian, MD  tapentadol (NUCYNTA) 50 MG TABS tablet Take 50 mg by mouth at bedtime.   Yes Historical Provider, MD  clindamycin (CLEOCIN-T) 1 % lotion Apply topically 2 (two) times daily. Patient not taking: Reported on 03/10/2014 07/09/13   Ronnald Nian, MD  Lorcaserin HCl (BELVIQ) 10 MG TABS Take 1 tablet by mouth 2 (two) times daily. Patient not taking: Reported on 01/02/2015 07/23/14   Ronnald Nian, MD  pantoprazole (PROTONIX) 20 MG tablet Take 1 tablet (20  mg total) by mouth daily. Patient not taking: Reported on 04/21/2014 04/21/14   Arby BarretteMarcy Pfeiffer, MD   Allergies  Allergen Reactions  . Acetaminophen Nausea And Vomiting  . Dilaudid [Hydromorphone Hcl] Nausea And Vomiting  . Erythromycin Rash  . Hydrocodone Nausea And Vomiting  . Oxycodone Nausea And Vomiting    Social History  Substance Use Topics  . Smoking status: Former Smoker    Quit date: 02/07/2001  . Smokeless tobacco: Never Used  . Alcohol Use: 0.6 oz/week    1 Glasses of wine per week     Comment: couple times a week    Family History  Problem Relation Age of Onset  . COPD Father   . Anesthesia problems Neg Hx   . Hypotension Neg Hx   .  Malignant hyperthermia Neg Hx   . Pseudochol deficiency Neg Hx   . Cancer Other   . Hypertension Other   . Diabetes Other      Review of Systems  Positive ROS: neg  All other systems have been reviewed and were otherwise negative with the exception of those mentioned in the HPI and as above.  Objective: Vital signs in last 24 hours: Temp:  [98.6 F (37 C)] 98.6 F (37 C) (12/08 0615) Resp:  [20] 20 (12/08 0615) BP: (108)/(86) 108/86 mmHg (12/08 0615) SpO2:  [99 %] 99 % (12/08 0615) Weight:  [88.905 kg (196 lb)] 88.905 kg (196 lb) (12/08 0615)  General Appearance: Alert, cooperative, no distress, appears stated age Head: Normocephalic, without obvious abnormality, atraumatic Eyes: PERRL, conjunctiva/corneas clear, EOM's intact    Neck: Supple, symmetrical, trachea midline Back: Symmetric, no curvature, ROM normal, no CVA tenderness Lungs:  respirations unlabored Heart: Regular rate and rhythm Abdomen: Soft, non-tender Extremities: Extremities normal, atraumatic, no cyanosis or edema Pulses: 2+ and symmetric all extremities Skin: Skin color, texture, turgor normal, no rashes or lesions  NEUROLOGIC:   Mental status: Alert and oriented x4,  no aphasia, good attention span, fund of knowledge, and memory Motor Exam - grossly normal Sensory Exam - grossly normal Reflexes: 1+ Coordination - grossly normal Gait - grossly normal Balance - grossly normal Cranial Nerves: I: smell Not tested  II: visual acuity  OS: nl    OD: nl  II: visual fields Full to confrontation  II: pupils Equal, round, reactive to light  III,VII: ptosis None  III,IV,VI: extraocular muscles  Full ROM  V: mastication Normal  V: facial light touch sensation  Normal  V,VII: corneal reflex  Present  VII: facial muscle function - upper  Normal  VII: facial muscle function - lower Normal  VIII: hearing Not tested  IX: soft palate elevation  Normal  IX,X: gag reflex Present  XI: trapezius strength  5/5   XI: sternocleidomastoid strength 5/5  XI: neck flexion strength  5/5  XII: tongue strength  Normal    Data Review Lab Results  Component Value Date   WBC 7.0 01/08/2015   HGB 13.6 01/08/2015   HCT 40.5 01/08/2015   MCV 92.5 01/08/2015   PLT 262 01/08/2015   Lab Results  Component Value Date   NA 139 01/08/2015   K 3.2* 01/08/2015   CL 104 01/08/2015   CO2 25 01/08/2015   BUN 9 01/08/2015   CREATININE 0.75 01/08/2015   GLUCOSE 112* 01/08/2015   Lab Results  Component Value Date   INR 1.01 11/19/2008    Assessment/Plan: Patient admitted for PLIF L5-S1. Patient has failed a reasonable attempt at conservative therapy.  I explained the condition and procedure to the patient and answered any questions.  Patient wishes to proceed with procedure as planned. Understands risks/ benefits and typical outcomes of procedure.   Jrue Yambao S 01/15/2015 6:26 AM

## 2015-01-15 NOTE — Progress Notes (Signed)
PT in to work with pt; pt foley removed at 1640. P. Seleta RhymesAmo Christmas Faraci RN

## 2015-01-15 NOTE — Anesthesia Preprocedure Evaluation (Signed)
Anesthesia Evaluation  Patient identified by MRN, date of birth, ID band Patient awake    Reviewed: Allergy & Precautions, NPO status , Patient's Chart, lab work & pertinent test results  History of Anesthesia Complications (+) PONV  Airway Mallampati: II  TM Distance: >3 FB Neck ROM: Full    Dental no notable dental hx.    Pulmonary neg pulmonary ROS, former smoker,    Pulmonary exam normal breath sounds clear to auscultation       Cardiovascular hypertension, Normal cardiovascular exam Rhythm:Regular Rate:Normal     Neuro/Psych Bipolar Disorder negative neurological ROS     GI/Hepatic negative GI ROS, Neg liver ROS,   Endo/Other  Hypothyroidism   Renal/GU negative Renal ROS  negative genitourinary   Musculoskeletal negative musculoskeletal ROS (+)   Abdominal   Peds negative pediatric ROS (+)  Hematology negative hematology ROS (+)   Anesthesia Other Findings   Reproductive/Obstetrics negative OB ROS                             Anesthesia Physical Anesthesia Plan  ASA: II  Anesthesia Plan: General   Post-op Pain Management:    Induction: Intravenous  Airway Management Planned: Oral ETT  Additional Equipment:   Intra-op Plan:   Post-operative Plan: Extubation in OR  Informed Consent: I have reviewed the patients History and Physical, chart, labs and discussed the procedure including the risks, benefits and alternatives for the proposed anesthesia with the patient or authorized representative who has indicated his/her understanding and acceptance.   Dental advisory given  Plan Discussed with: CRNA and Surgeon  Anesthesia Plan Comments:         Anesthesia Quick Evaluation

## 2015-01-15 NOTE — Op Note (Signed)
01/15/2015  10:42 AM  PATIENT:  Yolanda Weaver  44 y.o. female  PRE-OPERATIVE DIAGNOSIS:   Failed back syndrome , degenerative disc disease, back and leg pain  POST-OPERATIVE DIAGNOSIS:   same  PROCEDURE:   1. Decompressive lumbar laminectomy  L5-S1 requiring more work than would be required for a simple exposure of the disk for PLIF in order to adequately decompress the neural elements and address the spinal stenosis 2. Posterior lumbar interbody fusion  L5-S1 using PEEK interbody cages packed with morcellized allograft and autograft 3. Posterior fixation  L5-S1 using  Cortical pedicle screws.   SURGEON:  Marikay Alaravid Tanesha Arambula, MD  ASSISTANTS:  Dr. Venetia MaxonStern  ANESTHESIA:  General  EBL:  150 ml  Total I/O In: 1000 [I.V.:1000] Out: 450 [Urine:300; Blood:150]  BLOOD ADMINISTERED:none  DRAINS:  none  INDICATION FOR PROCEDURE:  This patient underwent a previous laminectomy in 2013. She had severe back and leg pain after that. She had an MRI which showed severe degenerative disc disease at L5-S1. She had foraminal stenosis compressing the L5 nerve root. I recommended decompression and instrumented fusion. Patient understood the risks, benefits, and alternatives and potential outcomes and wished to proceed.  PROCEDURE DETAILS:  The patient was brought to the operating room. After induction of generalized endotracheal anesthesia the patient was rolled into the prone position on chest rolls and all pressure points were padded. The patient's lumbar region was cleaned and then prepped with DuraPrep and draped in the usual sterile fashion. Anesthesia was injected and then a dorsal midline incision was made and carried down to the lumbosacral fascia. The fascia was opened and the paraspinous musculature was taken down in a subperiosteal fashion to expose  L5-S1. A self-retaining retractor was placed. Intraoperative fluoroscopy confirmed my level, and I started with placement of the  L5 cortical pedicle screws.  The pedicle screw entry zones were identified utilizing surface landmarks and  AP and lateral fluoroscopy. I scored the cortex with the high-speed drill and then used the hand drill and EMG monitoring to drill an upward and outward direction into the pedicle. I then tapped line to line, and the tap was also monitored. I then placed a  5-0 x 30 mm cortical pedicle screw into the pedicles of  L5 bilaterally. I then turned my attention to the decompression and the spinous process was removed and complete lumbar laminectomies, hemi- facetectomies, and foraminotomies were performed at  L5-S1. The patient had significant spinal stenosis and this required more work than would be required for a simple exposure of the disc for posterior lumbar interbody fusion. Much more generous decompression was undertaken in order to adequately decompress the neural elements and address the patient's leg pain. The yellow ligament was removed to expose the underlying dura and nerve roots, and generous foraminotomies were performed to adequately decompress the neural elements. Both the exiting and traversing nerve roots were decompressed on both sides until a coronary dilator passed easily along the nerve roots. Once the decompression was complete, I turned my attention to the posterior lower lumbar interbody fusion. The epidural venous vasculature was coagulated and cut sharply. Disc space was incised and the initial discectomy was performed with pituitary rongeurs. The disc space was distracted with sequential distractors to a height of  8 mm. We then used a series of scrapers and shavers to prepare the endplates for fusion. The midline was prepared with Epstein curettes. Once the complete discectomy was finished, we packed an appropriate sized peek interbody cage with  local autograft and morcellized allograft, gently retracted the nerve root, and tapped the cage into position at  L5-S1.  The midline between the cages was packed with  morselized autograft and allograft. We then turned our attention to the placement of the lower pedicle screws. The pedicle screw entry zones were identified utilizing surface landmarks and fluoroscopy. I drilled into each pedicle utilizing the hand drill and EMG monitoring, and tapped each pedicle with the appropriate tap. We palpated with a ball probe to assure no break in the cortex. We then placed  6.5 x 35 mm screws into the pedicles bilaterally at  S1.  We then placed lordotic rods into the multiaxial screw heads of the pedicle screws and locked these in position with the locking caps and anti-torque device. We then checked our construct with AP and lateral fluoroscopy. Irrigated with copious amounts of bacitracin-containing saline solution. Placed a medium Hemovac drain through separate stab incision. Inspected the nerve roots once again to assure adequate decompression, lined to the dura with Gelfoam, and closed the muscle and the fascia with 0 Vicryl. Closed the subcutaneous tissues with 2-0 Vicryl and subcuticular tissues with 3-0 Vicryl. The skin was closed with benzoin and Steri-Strips. Dressing was then applied, the patient was awakened from general anesthesia and transported to the recovery room in stable condition. At the end of the procedure all sponge, needle and instrument counts were correct.   PLAN OF CARE: Admit to inpatient   PATIENT DISPOSITION:  PACU - hemodynamically stable.   Delay start of Pharmacological VTE agent (>24hrs) due to surgical blood loss or risk of bleeding:  yes

## 2015-01-16 ENCOUNTER — Encounter (HOSPITAL_COMMUNITY): Payer: Self-pay | Admitting: Neurological Surgery

## 2015-01-16 NOTE — Discharge Summary (Signed)
Physician Discharge Summary  Patient ID: Yolanda PeaLaurie T Weaver MRN: [id number] DOB/AGE: 08-Feb-1970 44 y.o.  Admit date: 01/15/2015 Discharge date: 01/16/2015  Admission Diagnoses: Failed back syndrome , degenerative disc disease, back and leg pain   Discharge Diagnoses: Failed back syndrome , degenerative disc disease, back and leg pain s/p 1. Decompressive lumbar laminectomy  L5-S1 requiring more work than would be required for a simple exposure of the disk for PLIF in order to adequately decompress the neural elements and address the spinal stenosis 2. Posterior lumbar interbody fusion  L5-S1 using PEEK interbody cages packed with morcellized allograft and autograft 3. Posterior fixation  L5-S1 using  Cortical pedicle screws.   Active Problems:   S/P lumbar spinal fusion   Discharged Condition: good  Hospital Course: Yolanda Weaver was admitted for surgery with dx DDD with radiculopathy.  Following uncomplicated decompression and fusion L5-S1, she recovered well and transferred to [facility] for nursing care and therapies. She has progressed nicely.    Consults: None  Significant Diagnostic Studies: radiology: X-Ray: intra-operative  Treatments: surgery: 1. Decompressive lumbar laminectomy  L5-S1 requiring more work than would be required for a simple exposure of the disk for PLIF in order to adequately decompress the neural elements and address the spinal stenosis 2. Posterior lumbar interbody fusion  L5-S1 using PEEK interbody cages packed with morcellized allograft and autograft 3. Posterior fixation  L5-S1 using  Cortical pedicle screws.    Discharge Exam: Blood pressure 119/62, pulse 81, temperature 98.1 F (36.7 C), temperature source Oral, resp. rate 18, height 5\' 6"  (1.676 m), weight 88.905 kg (196 lb), SpO2 98 %. Alert, conversant. Reports no leg or buttock pain, only lumbar soreness. Up to bathroom through the night. Good strength BLE. Incision with honeycomb over steristrips; no erthema,  swelling, or drainage.    Disposition: 01-Home or Self Care  Pt verbalizes understanding of d/c instructions.  She has an appointment scheduled with DrJones for f/u. Rx's will be tubed up to floor from OR for Nucynta 75mg  q6hrs prn pain & Robaxin 500mg  q8hrs prn spasm.       Medication List    ASK your doctor about these medications        amoxicillin-clavulanate 875-125 MG tablet  Commonly known as:  AUGMENTIN  Take 1 tablet by mouth 2 (two) times daily.     clindamycin 1 % lotion  Commonly known as:  CLEOCIN-T  Apply topically 2 (two) times daily.     Diphenhydramine-PE-APAP 25-5-325 MG Tabs  Take 1 tablet by mouth every 8 (eight) hours as needed (sinus).     EQ SEVERE ALLERGY & SINUS PO  Take 1 tablet by mouth as needed (for allergies and sinuses).     hydrochlorothiazide 25 MG tablet  Commonly known as:  HYDRODIURIL  Take 1 tablet (25 mg total) by mouth daily.     hyoscyamine 0.125 MG SL tablet  Commonly known as:  LEVSIN/SL  Place 1 tablet (0.125 mg total) under the tongue every 4 (four) hours as needed for cramping.     ibuprofen 800 MG tablet  Commonly known as:  ADVIL,MOTRIN  TAKE 1 TABLET BY MOUTH EVERY 8 HOURS AS NEEDED WITH FOOD     lamoTRIgine 200 MG tablet  Commonly known as:  LAMICTAL  Take 1.5 tablets (300 mg total) by mouth at bedtime.     levothyroxine 50 MCG tablet  Commonly known as:  SYNTHROID, LEVOTHROID  Take 1 tablet (50 mcg total) by mouth at bedtime.  Lorcaserin HCl 10 MG Tabs  Commonly known as:  BELVIQ  Take 1 tablet by mouth 2 (two) times daily.     NUCYNTA 50 MG Tabs tablet  Generic drug:  tapentadol  Take 50 mg by mouth at bedtime.     pantoprazole 20 MG tablet  Commonly known as:  PROTONIX  Take 1 tablet (20 mg total) by mouth daily.         Signed: Georgiann Cocker 01/16/2015, 9:27 AM

## 2015-01-16 NOTE — Progress Notes (Signed)
Physical Therapy Treatment Patient Details Name: BLIMA JAIMES MRN: [id number] DOB: Nov 09, 1970 Today's Date: 01/16/2015    History of Present Illness 44 y.o. female with hx of HTN, depression, anxiety, and bipolar disorder. S/p LUMBAR FIVE-SACRAL-ONE POSTERIOR LUMBAR INTERBODY FUSION.    PT Comments    Pt moving very well and reporting no pain. Answered all questions from pt and mother regarding mobility. Encouraged to return to activity gradually.  Pt and mother feel ready and safe for DC.    Follow Up Recommendations        Equipment Recommendations       Recommendations for Other Services       Precautions / Restrictions Precautions Precautions: Back Precaution Comments: Pt able to recall 3/3 back precautions Required Braces or Orthoses: Spinal Brace Spinal Brace: Lumbar corset;Applied in sitting position Restrictions Weight Bearing Restrictions: No    Mobility  Bed Mobility Overal bed mobility: Needs Assistance Bed Mobility: Rolling;Sidelying to Sit;Sit to Sidelying Rolling: Modified independent (Device/Increase time) Sidelying to sit: Supervision     Sit to sidelying: Supervision General bed mobility comments: Reviewed importance of log rolling when getting in/out of bed  Transfers Overall transfer level: Needs assistance Equipment used: None Transfers: Sit to/from Stand Sit to Stand: Supervision         General transfer comment: performed without cueing or assistance  Ambulation/Gait Ambulation/Gait assistance: Supervision Ambulation Distance (Feet): 250 Feet Assistive device: None Gait Pattern/deviations: Step-through pattern Gait velocity: decreased Gait velocity interpretation: Below normal speed for age/gender (slightly) General Gait Details: no balance loss throughout    Stairs Stairs: Yes Stairs assistance: Supervision Stair Management: Two rails;Step to pattern;Forwards Number of Stairs: 3 General stair comments: educated to do steps in  step to pattern  Wheelchair Mobility    Modified Rankin (Stroke Patients Only)       Balance Overall balance assessment: Needs assistance         Standing balance support: No upper extremity supported Standing balance-Leahy Scale: Good                      Cognition Arousal/Alertness: Awake/alert Behavior During Therapy: WFL for tasks assessed/performed Overall Cognitive Status: Within Functional Limits for tasks assessed                      Exercises      General Comments General comments (skin integrity, edema, etc.): With pt's permission pt's mother was present for entire session      Pertinent Vitals/Pain Pain Assessment: No/denies pain    Home Living Family/patient expects to be discharged to:: Private residence Living Arrangements: Spouse/significant other;Children (staying with parents for first week) Available Help at Discharge: Family;Available 24 hours/day Type of Home: House Home Access: Stairs to enter Entrance Stairs-Rails: Left Home Layout: One level Home Equipment: None      Prior Function Level of Independence: Independent          PT Goals (current goals can now be found in the care plan section) Acute Rehab PT Goals Patient Stated Goal: to go home later today Progress towards PT goals: Progressing toward goals    Frequency       PT Plan Current plan remains appropriate    Co-evaluation             End of Session Equipment Utilized During Treatment: Gait belt;Back brace Activity Tolerance: Patient tolerated treatment well Patient left: in bed;with call bell/phone within reach;with family/visitor present     Time:  [id number] PT Time Calculation (min) (ACUTE ONLY): 31 min  Charges:  $Gait Training: 23-37 mins                    G Codes:      Donnella ShamSawulski, Joan Avetisyan J 01/16/2015, 1:44 PM  Lavona MoundMark Deidrea Gaetz, PT  [phone] 01/16/2015

## 2015-01-16 NOTE — Progress Notes (Signed)
Subjective: Patient reports "I don't hurt much at all"  Objective: Vital signs in last 24 hours: Temp:  [98.1 F (36.7 C)-98.5 F (36.9 C)] 98.1 F (36.7 C) (12/09 0448) Pulse Rate:  [70-96] 81 (12/09 0448) Resp:  [12-21] 18 (12/09 0448) BP: (119-142)/(62-89) 119/62 mmHg (12/09 0448) SpO2:  [97 %-100 %] 98 % (12/09 0448)  Intake/Output from previous day: 12/08 0701 - 12/09 0700 In: 1585 [P.O.:480; I.V.:1000; IV Piggyback:105] Out: 1650 [Urine:1500; Blood:150] Intake/Output this shift: Total I/O In: 3 [I.V.:3] Out: -   Alert, conversant. Up to bathroom through the night. Good strength BLE. Incision with honeycomb over steristrips; no erthema, swelling, or drainage.   Lab Results: No results for input(s): WBC, HGB, HCT, PLT in the last 72 hours. BMET No results for input(s): NA, K, CL, CO2, GLUCOSE, BUN, CREATININE, CALCIUM in the last 72 hours.  Studies/Results: Dg Lumbar Spine 2-3 Views  01/15/2015  CLINICAL DATA:  Posterior lateral lumbar fusion.  Fusion at L5-L6. EXAM: DG C-ARM 61-120 MIN; LUMBAR SPINE - 2-3 VIEW COMPARISON:  Lumbar MRI 08/02/2014 FINDINGS: 3 portable intraoperative views are provided. Posterior lumbar fusion at L5-S1 with pedicle screws and interbody spacer. IMPRESSION: Posterior lumbar fusion as above. Electronically Signed   By: Genevive BiStewart  Edmunds M.D.   On: 01/15/2015 10:53   Dg C-arm 61-120 Min  01/15/2015  CLINICAL DATA:  Posterior lateral lumbar fusion.  Fusion at L5-L6. EXAM: DG C-ARM 61-120 MIN; LUMBAR SPINE - 2-3 VIEW COMPARISON:  Lumbar MRI 08/02/2014 FINDINGS: 3 portable intraoperative views are provided. Posterior lumbar fusion at L5-S1 with pedicle screws and interbody spacer. IMPRESSION: Posterior lumbar fusion as above. Electronically Signed   By: Genevive BiStewart  Edmunds M.D.   On: 01/15/2015 10:53    Assessment/Plan: improving   LOS: 1 day  Per DrStern, d/c IV, d/c to home. Pt verbalizes understanding of d/c instructions.  She has an appointment  scheduled with DrJones for f/u. Rx's will be tubed up to floor from OR for Nucynta 75mg  q6hrs prn pain & Robaxin 500mg  q8hrs prn spasm.    Georgiann Cockeroteat, Helon Wisinski 01/16/2015, 9:23 AM

## 2015-01-16 NOTE — Care Management Note (Signed)
Case Management Note  Patient Details  Name: Yolanda Weaver MRN: [id number] Date of Birth: 1970/10/28  Subjective/Objective:                    Action/Plan: Patient discharging home today with self care. Patient is ordered a rolling walker but she is refusing. No further needs per CM.   Expected Discharge Date:                  Expected Discharge Plan:  Home/Self Care  In-House Referral:     Discharge planning Services     Post Acute Care Choice:  Durable Medical Equipment Choice offered to:  Patient  DME Arranged:  Walker rolling DME Agency:     HH Arranged:    HH Agency:     Status of Service:  Completed, signed off  Medicare Important Message Given:    Date Medicare IM Given:    Medicare IM give by:    Date Additional Medicare IM Given:    Additional Medicare Important Message give by:     If discussed at Long Length of Stay Meetings, dates discussed:    Additional Comments:  Kermit BaloKelli F Kallee Nam, RN 01/16/2015, 9:56 AM

## 2015-01-16 NOTE — Evaluation (Signed)
Occupational Therapy Evaluation Patient Details Name: Yolanda Weaver MRN: [id number] DOB: May 16, 1970 Today's Date: 01/16/2015    History of Present Illness 44 y.o. female with hx of HTN, depression, anxiety, and bipolar disorder. S/p LUMBAR FIVE-SACRAL-ONE POSTERIOR LUMBAR INTERBODY FUSION.   Clinical Impression   Pt reports she was independent with ADLs PTA. Currently pt is supervision for ADLs and mobility with the exception of mod assist for LB ADLs. All education complete; pt with no further questions or concerns for OT at this time. Pt planning to d/c home with 24/7 supervision from family. Pt ready to d/c from an OT standpoint; signing off at this time. Thank you for this referral.     Follow Up Recommendations  No OT follow up;Supervision - Intermittent    Equipment Recommendations  None recommended by OT    Recommendations for Other Services       Precautions / Restrictions Precautions Precautions: Back Precaution Comments: Pt able to recall 3/3 back precautions Required Braces or Orthoses: Spinal Brace Spinal Brace: Lumbar corset;Applied in sitting position Restrictions Weight Bearing Restrictions: No      Mobility Bed Mobility               General bed mobility comments: Pt OOB in chair  Transfers Overall transfer level: Needs assistance Equipment used: 1 person hand held assist Transfers: Sit to/from Stand Sit to Stand: Min assist         General transfer comment: Min hand held assist for sit to stand from chair to simulate sit to stand from toilet    Balance Overall balance assessment: Needs assistance         Standing balance support: No upper extremity supported Standing balance-Leahy Scale: Good                              ADL Overall ADL's : Needs assistance/impaired Eating/Feeding: Set up;Sitting   Grooming: Supervision/safety;Standing   Upper Body Bathing: Supervision/ safety;Sitting   Lower Body Bathing: Minimal  assistance;Sit to/from stand   Upper Body Dressing : Supervision/safety;Sitting   Lower Body Dressing: Minimal assistance;Sit to/from stand Lower Body Dressing Details (indicate cue type and reason): Pt unable to reach bilateral feet. Discussed use of AE; pt reports someone will be available to assist with ADLs at home.  Toilet Transfer: Supervision/safety;Ambulation;Regular Teacher, adult education Details (indicate cue type and reason): Simulated toilet transfer using only support on R side to simulate home environment.  Toileting- Clothing Manipulation and Hygiene: Supervision/safety;With adaptive equipment;Sit to/from stand Toileting - Clothing Manipulation Details (indicate cue type and reason): Educated pt on use of toilet tongs for toilet hygiene; pt verbalized understanding. Tub/ Shower Transfer: Walk-in shower;Supervision/safety;Ambulation   Functional mobility during ADLs: Supervision/safety General ADL Comments: No family present for OT eval. Educated on compensatory strategies for ADLs in order to maintain back precautions, need for supervision during ADLs and mobility, ice for edema and pain, keeping all frequently used items at countertop height, using a cup to spit for brushing teeth; pt verbalized understanding.     Vision     Perception     Praxis      Pertinent Vitals/Pain Pain Assessment: No/denies pain     Hand Dominance Right   Extremity/Trunk Assessment Upper Extremity Assessment Upper Extremity Assessment: Overall WFL for tasks assessed   Lower Extremity Assessment Lower Extremity Assessment: Defer to PT evaluation   Cervical / Trunk Assessment Cervical / Trunk Assessment: Normal (s/p surgery)  Communication Communication Communication: No difficulties   Cognition Arousal/Alertness: Awake/alert Behavior During Therapy: WFL for tasks assessed/performed Overall Cognitive Status: Within Functional Limits for tasks assessed                      General Comments       Exercises       Shoulder Instructions      Home Living Family/patient expects to be discharged to:: Private residence Living Arrangements: Spouse/significant other;Children (staying with parents for first week) Available Help at Discharge: Family;Available 24 hours/day Type of Home: House Home Access: Stairs to enter Entergy CorporationEntrance Stairs-Number of Steps: 1 Entrance Stairs-Rails: Left Home Layout: One level     Bathroom Shower/Tub: Walk-in shower;Door (tub shower at home)   Bathroom Toilet: Standard Bathroom Accessibility: Yes   Home Equipment: None          Prior Functioning/Environment Level of Independence: Independent             OT Diagnosis: Acute pain   OT Problem List:     OT Treatment/Interventions:      OT Goals(Current goals can be found in the care plan section) Acute Rehab OT Goals Patient Stated Goal: to go home later today OT Goal Formulation: With patient  OT Frequency:     Barriers to D/C:            Co-evaluation              End of Session Equipment Utilized During Treatment: Back brace  Activity Tolerance: Patient tolerated treatment well Patient left: in chair;with call bell/phone within reach   Time: 1026-1040 OT Time Calculation (min): 14 min Charges:  OT General Charges $OT Visit: 1 Procedure OT Evaluation $Initial OT Evaluation Tier I: 1 Procedure G-Codes:     Gaye AlkenBailey A Ebin Palazzi M.S., OTR/L Pager: [phone]  01/16/2015, 10:52 AM

## 2015-01-16 NOTE — Progress Notes (Signed)
Patient discharged home with husbands care summary reviewed, IV removed and medications explained will have volunteer services take patient to their car, she is alert and not complaits of pain.         

## 2015-01-19 LAB — TYPE AND SCREEN
ABO/RH(D): B NEG
ANTIBODY SCREEN: POSITIVE
DAT, IGG: NEGATIVE
Donor AG Type: NEGATIVE
Donor AG Type: NEGATIVE
UNIT DIVISION: 0
Unit division: 0

## 2015-01-29 ENCOUNTER — Telehealth: Payer: Self-pay | Admitting: Family Medicine

## 2015-01-29 NOTE — Telephone Encounter (Signed)
Called pt and advised her of Dr Jola BabinskiLalonde's instructions. She will call if she needs an appt

## 2015-01-29 NOTE — Telephone Encounter (Signed)
Have her gargle, take Tylenol and whatever works for the coughing. NyQuil is great at night. I'm here tomorrow morning if she needs to be seen

## 2015-01-29 NOTE — Telephone Encounter (Signed)
As per jcl forwarded

## 2015-01-29 NOTE — Telephone Encounter (Signed)
Pt has slight sore throat, chest congestion, sniffles. She has tries Robitussin DM cold & cough and starting Mucinex. She does not have a ride to get in for an appt and can not drive due to a recent surgery. What can she take or can she get an script for something?

## 2015-03-04 ENCOUNTER — Telehealth: Payer: Self-pay | Admitting: Family Medicine

## 2015-03-04 NOTE — Telephone Encounter (Signed)
Yes

## 2015-03-04 NOTE — Telephone Encounter (Signed)
Pt is needing a wellness form completed for her job which she says only includes height, weight,  Cholesterol & related basic bloodwork. She says that she Dr Susann Givens completes this every year for her without a CPE appt since she gets CPE with OBGYN so she made a Consult appt on 2/14 to come in to get form completed. Is this OK?

## 2015-03-24 ENCOUNTER — Institutional Professional Consult (permissible substitution): Payer: BLUE CROSS/BLUE SHIELD | Admitting: Family Medicine

## 2015-03-31 ENCOUNTER — Ambulatory Visit (INDEPENDENT_AMBULATORY_CARE_PROVIDER_SITE_OTHER): Payer: BLUE CROSS/BLUE SHIELD | Admitting: Medical

## 2015-03-31 ENCOUNTER — Encounter: Payer: Self-pay | Admitting: Medical

## 2015-03-31 VITALS — BP 142/100 | HR 79 | Temp 98.3°F | Wt 210.0 lb

## 2015-03-31 DIAGNOSIS — R05 Cough: Secondary | ICD-10-CM | POA: Diagnosis not present

## 2015-03-31 DIAGNOSIS — J0100 Acute maxillary sinusitis, unspecified: Secondary | ICD-10-CM | POA: Diagnosis not present

## 2015-03-31 DIAGNOSIS — I10 Essential (primary) hypertension: Secondary | ICD-10-CM

## 2015-03-31 DIAGNOSIS — R059 Cough, unspecified: Secondary | ICD-10-CM

## 2015-03-31 MED ORDER — AMOXICILLIN-POT CLAVULANATE 875-125 MG PO TABS: 1.0000 | ORAL_TABLET | Freq: Two times a day (BID) | ORAL | Status: DC

## 2015-03-31 NOTE — Progress Notes (Signed)
Subjective: Chief Complaint  Patient presents with  . sore throat    chest cough, with green mucous. no fever no chills. states her daugther had the flu. is on bp medication but says bp has been running high. wants to discuss diet meds but is bipolar.    Feels like crap. Has metal taste in mouth, getting up green mucous, cough, productive cough, sinus pressure, sore throat.  Has had colored nasal mucous for over 2 weeks.   No fever, no chills, no nausea or vomiting, no diarrhea.   Has felt mild SOB.   Has had flu contact at home, GI bug contact at home, other respiratory illness contacts at home.  Last antibiotic was back in November/December for similar.    Has hx/o hypertension diagnosed 2013, but pain is bigger trigger with her BP.   Just has back surgery 01/15/15.   Takes HCTZ, but not always good about taking BP medication daily.   Dr. Yetta Barre did her recent back surgery.  Past Medical History  Diagnosis Date  . Acne   . Contraception   . Bipolar 1 disorder (HCC)   . Migraine   . Ptosis, right eyelid   . Allergy   . Hypothyroid   . PONV (postoperative nausea and vomiting)   . Blood transfusion     h/o tx for rh incompatibility during pregnancy. see note re significant antibody  . Mental disorder     bipolar  . Recurrent upper respiratory infection (URI)     reports she had bronchitis, treated /w antibiotics in past month  . Obesity   . Anxiety   . Depression   . Hypertension    ROS as in subjective   Objective BP 142/100 mmHg  Pulse 79  Temp(Src) 98.3 F (36.8 C) (Tympanic)  Wt 210 lb (95.255 kg)  General appearance: alert, no distress, WD/WN HEENT: normocephalic, sclerae anicteric, maxillary sinus tenderness, TMs pearly, nares patent, no discharge or erythema, pharynx with erythema, mucoid pnd Oral cavity: MMM, no lesions Neck: supple, no lymphadenopathy, no thyromegaly, no masses Heart: RRR, normal S1, S2, no murmurs Lungs: CTA bilaterally, no wheezes, rhonchi, or  rales Ext:no edema Pulses: 2+ symmetric, upper and lower extremities, normal cap refill Wearing soft lumbar support brace   Assessment: Encounter Diagnoses  Name Primary?  . Acute maxillary sinusitis, recurrence not specified Yes  . Cough   . Essential hypertension       Plan: Begin Augmentin, rest, hydrate well, can use OTC mucinex  HTN - looking back through chart, not controlled.   C/t HCTZ, advised BP log and monitoring at home.  Return soon with Dr. Susann Givens to discussed potential for additional therapy for BP.  In general, work on weight loss, exercise, healthy diet, avoiding added salt, and currently avoid decongestants given high readings today.  Pavneet was seen today for sore throat.  Diagnoses and all orders for this visit:  Acute maxillary sinusitis, recurrence not specified -     amoxicillin-clavulanate (AUGMENTIN) 875-125 MG tablet; Take 1 tablet by mouth 2 (two) times daily. For 10 days  Cough  Essential hypertension

## 2015-05-02 ENCOUNTER — Encounter (HOSPITAL_COMMUNITY): Payer: Self-pay | Admitting: Family Medicine

## 2015-05-02 ENCOUNTER — Emergency Department (HOSPITAL_COMMUNITY)
Admission: EM | Admit: 2015-05-02 | Discharge: 2015-05-02 | Disposition: A | Discharge status: Home / Self Care | Payer: BLUE CROSS/BLUE SHIELD | Attending: Emergency Medicine | Admitting: Emergency Medicine

## 2015-05-02 ENCOUNTER — Emergency Department (HOSPITAL_COMMUNITY): Payer: BLUE CROSS/BLUE SHIELD

## 2015-05-02 DIAGNOSIS — Z8669 Personal history of other diseases of the nervous system and sense organs: Secondary | ICD-10-CM | POA: Diagnosis not present

## 2015-05-02 DIAGNOSIS — Z8709 Personal history of other diseases of the respiratory system: Secondary | ICD-10-CM | POA: Diagnosis not present

## 2015-05-02 DIAGNOSIS — F419 Anxiety disorder, unspecified: Secondary | ICD-10-CM | POA: Diagnosis not present

## 2015-05-02 DIAGNOSIS — E039 Hypothyroidism, unspecified: Secondary | ICD-10-CM | POA: Diagnosis not present

## 2015-05-02 DIAGNOSIS — M545 Low back pain: Secondary | ICD-10-CM | POA: Diagnosis present

## 2015-05-02 DIAGNOSIS — Z87891 Personal history of nicotine dependence: Secondary | ICD-10-CM | POA: Insufficient documentation

## 2015-05-02 DIAGNOSIS — Z79899 Other long term (current) drug therapy: Secondary | ICD-10-CM | POA: Insufficient documentation

## 2015-05-02 DIAGNOSIS — E669 Obesity, unspecified: Secondary | ICD-10-CM | POA: Diagnosis not present

## 2015-05-02 DIAGNOSIS — I10 Essential (primary) hypertension: Secondary | ICD-10-CM | POA: Insufficient documentation

## 2015-05-02 DIAGNOSIS — F319 Bipolar disorder, unspecified: Secondary | ICD-10-CM | POA: Insufficient documentation

## 2015-05-02 DIAGNOSIS — Z872 Personal history of diseases of the skin and subcutaneous tissue: Secondary | ICD-10-CM | POA: Diagnosis not present

## 2015-05-02 DIAGNOSIS — M5416 Radiculopathy, lumbar region: Secondary | ICD-10-CM

## 2015-05-02 DIAGNOSIS — G43909 Migraine, unspecified, not intractable, without status migrainosus: Secondary | ICD-10-CM | POA: Insufficient documentation

## 2015-05-02 DIAGNOSIS — Z9889 Other specified postprocedural states: Secondary | ICD-10-CM | POA: Diagnosis not present

## 2015-05-02 MED ORDER — PREDNISONE 10 MG PO TABS: 60.0000 mg | ORAL_TABLET | Freq: Every day | ORAL | Status: DC

## 2015-05-02 MED ORDER — GADOBENATE DIMEGLUMINE 529 MG/ML IV SOLN
20.0000 mL | Freq: Once | INTRAVENOUS | Status: AC | PRN
  Administered 2015-05-02: 20 mL via INTRAVENOUS

## 2015-05-02 NOTE — ED Notes (Signed)
Pt comfortable with discharge and follow up instructions. Prescriptions x1 

## 2015-05-02 NOTE — ED Notes (Signed)
Pt not back from MRI at this time.  Will discharge once she has returned to treatment room.

## 2015-05-02 NOTE — ED Notes (Signed)
Brought patient back to room; patient given a gown to undress and place on; EsterbrookEmily, RN aware of patient

## 2015-05-02 NOTE — Discharge Instructions (Signed)

## 2015-05-02 NOTE — ED Notes (Signed)
Pt presents from home via POV with c/o lumbar pain with radiation into RLE that began on Monday and has progressively worsened throughout the week.  She had lumbar surgery on 01/15/2015 and returned to work this past Monday.  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  She got in touch with Dr. Gus HeightButarro today and he advised to come here for MRI.

## 2015-05-02 NOTE — ED Provider Notes (Signed)
CSN: [id number]     Arrival date & time 05/02/15  [id number] History   First MD Initiated Contact with Patient 05/02/15 0945     Chief Complaint  Patient presents with  . Back Pain      HPI Patient is a 45 year old female with a history of back surgery 2.  Her last surgery was in December 2016 which was a 2 level fusion of her lumbar spine.  She presents with worsening low back pain with radiation down her right leg over the past 4-5 days.  She states that movement seems to exacerbate her pain.  She spoke with her neurosurgical team today who recommended that she come to the ER for an MRI.  She denies fevers and chills.  No recent illness.  Denies abdominal pain.  Denies weakness of her legs but reports significant pain in her right leg.  At this time she does not want anything for pain as she states multiple pain medications give her significant nausea and she would prefer to just have the MRI at this time.   Past Medical History  Diagnosis Date  . Acne   . Contraception   . Bipolar 1 disorder (HCC)   . Migraine   . Ptosis, right eyelid   . Allergy   . Hypothyroid   . PONV (postoperative nausea and vomiting)   . Blood transfusion     h/o tx for rh incompatibility during pregnancy. see note re significant antibody  . Mental disorder     bipolar  . Recurrent upper respiratory infection (URI)     reports she had bronchitis, treated /w antibiotics in past month  . Obesity   . Anxiety   . Depression   . Hypertension    Past Surgical History  Procedure Laterality Date  . Knee arthroscopy      x3 r  . Diagnostic laparoscopy      evaluation for endometriosis  . Thyroidectomy, partial    . Lumbar laminectomy  04/08/2011    Procedure: MICRODISCECTOMY LUMBAR LAMINECTOMY;  Surgeon: Eldred MangesMark C Yates, MD;  Location: [facility] OR;  Service: Orthopedics;  Laterality: Right;  Right L5-S1 Microdiscectomy  . Foot foreign body removal    . Lumbar fusion  01/15/2015  . Maximum access (mas)posterior lumbar  interbody fusion (plif) 1 level N/A 01/15/2015    Procedure: LUMBAR FIVE-SACRA L-ONE MAXIMUM ACCESS SURGERY POSTERIOR LUMBAR INTERBODY FUSION ;  Surgeon: Tia Alertavid S Jones, MD;  Location: [facility];  Service: Neurosurgery;  Laterality: N/A;   Family History  Problem Relation Age of Onset  . COPD Father   . Anesthesia problems Neg Hx   . Hypotension Neg Hx   . Malignant hyperthermia Neg Hx   . Pseudochol deficiency Neg Hx   . Cancer Other   . Hypertension Other   . Diabetes Other    Social History  Substance Use Topics  . Smoking status: Former Smoker    Quit date: 02/07/2001  . Smokeless tobacco: Never Used  . Alcohol Use: 0.6 oz/week    1 Glasses of wine per week     Comment: couple times a week   OB History    No data available     Review of Systems  All other systems reviewed and are negative.     Allergies  Acetaminophen; Dilaudid; Erythromycin; Hydrocodone; and Oxycodone  Home Medications   Prior to Admission medications   Medication Sig Start Date End Date Taking? Authorizing Provider  amoxicillin-clavulanate (AUGMENTIN) 875-125 MG tablet Take  1 tablet by mouth 2 (two) times daily. For 10 days 03/31/15   Kermit Balo Tysinger, PA-C  Diphenhydramine-PE-APAP (EQ SEVERE ALLERGY & SINUS PO) Take 1 tablet by mouth as needed (for allergies and sinuses). Reported on 03/31/2015    Historical Provider, MD  Diphenhydramine-PE-APAP 25-5-325 MG TABS Take 1 tablet by mouth every 8 (eight) hours as needed (sinus). Reported on 03/31/2015    Historical Provider, MD  hydrochlorothiazide (HYDRODIURIL) 25 MG tablet Take 1 tablet (25 mg total) by mouth daily. 10/31/14   Ronnald Nian, MD  hyoscyamine (LEVSIN/SL) 0.125 MG SL tablet Place 1 tablet (0.125 mg total) under the tongue every 4 (four) hours as needed for cramping. Patient not taking: Reported on 03/31/2015 07/09/13   Ronnald Nian, MD  ibuprofen (ADVIL,MOTRIN) 800 MG tablet TAKE 1 TABLET BY MOUTH EVERY 8 HOURS AS NEEDED WITH FOOD 02/13/13    Delories Heinz, DPM  lamoTRIgine (LAMICTAL) 200 MG tablet Take 1.5 tablets (300 mg total) by mouth at bedtime. 04/10/14   Ronnald Nian, MD  levothyroxine (SYNTHROID, LEVOTHROID) 50 MCG tablet Take 1 tablet (50 mcg total) by mouth at bedtime. 03/31/14   Ronnald Nian, MD  tapentadol (NUCYNTA) 50 MG TABS tablet Take 50 mg by mouth at bedtime.    Historical Provider, MD   There were no vitals taken for this visit. Physical Exam  Constitutional: She is oriented to person, place, and time. She appears well-developed and well-nourished.  HENT:  Head: Normocephalic.  Eyes: EOM are normal.  Neck: Normal range of motion.  Pulmonary/Chest: Effort normal.  Abdominal: She exhibits no distension. There is no tenderness.  Musculoskeletal: Normal range of motion.  Well-healed surgical incision side of her lumbar region.  Mild lumbar and paralumbar tenderness.  No surrounding erythema.  Bilateral lower extremities are normal in size.  She has normal range of motion of her major joints of her lower extremities bilaterally and has full strength of her lower extremity major muscle groups.  Neurological: She is alert and oriented to person, place, and time.  Psychiatric: She has a normal mood and affect.  Nursing note and vitals reviewed.   ED Course  Procedures (including critical care time) Labs Review Labs Reviewed - No data to display  Imaging Review Mr Lumbar Spine W Wo Contrast  05/02/2015  CLINICAL DATA:  45 year old female with 1 week of lumbar back pain radiating to the right lower extremity. Lumbar surgery in December. Initial encounter. EXAM: MRI LUMBAR SPINE WITHOUT AND WITH CONTRAST TECHNIQUE: Multiplanar and multiecho pulse sequences of the lumbar spine were obtained without and with intravenous contrast. CONTRAST:  20mL MULTIHANCE GADOBENATE DIMEGLUMINE 529 MG/ML IV SOLN COMPARISON:  [facility] lumbar radiographs 02/20/2015. Intraoperative lumbar images [id number]. Preoperative  lumbar MRI 08/02/2014. FINDINGS: Same numbering system as on the 2016 MRI designating normal lumbar segmentation. Interval decompression and fusion at L5-S1 with mild hardware susceptibility artifact. Decreased retrolisthesis at that level. Stable vertebral height and alignment elsewhere. No marrow edema or evidence of acute osseous abnormality. Visualized lower thoracic spinal cord is normal with conus medularis at L1-L2. No abnormal intradural enhancement. Postoperative changes to the posterior paraspinal soft tissues at L5-S1 with no postoperative fluid collection. Negative visualized abdominal viscera. T11-T12:  Negative. T12-L1:  Negative. L1-L2:  Mild facet hypertrophy is stable.  Otherwise negative. L2-L3: Borderline to mild facet hypertrophy is stable, otherwise negative. L3-L4: Stable borderline to mild facet hypertrophy and otherwise negative. L4-L5: Mildly progressed facet hypertrophy, greater on the left. New trace  facet joint fluid. Negative disc. No stenosis. L5-S1: Sequelae of decompression and fusion. Capacious thecal sac. Mild residual L5 endplate spurring. No stenosis identified. IMPRESSION: 1. Interval decompression and fusion at L5-S1 with no adverse features. 2. Mild adjacent segment disease at L4-L5 in the form of progressed mild facet hypertrophy greater on the left. No associated stenosis. 3. Elsewhere stable mild facet degeneration with no other lumbar disc disease. Electronically Signed   By: Odessa Fleming M.D.   On: 05/02/2015 11:39   I have personally reviewed and evaluated these images and lab results as part of my medical decision-making.   EKG Interpretation None      MDM   Final diagnoses:  None    Patient will undergo MRI at this time.  She's not waiting for pain.  Pain medication has been offered.  Low suspicion that she will need surgical intervention today but we will obtain the MRI  11:53 AM MRI without adverse features.  Discharge home in good condition.  Primary  care and neurosurgical follow-up.  Patient be given a short course of steroids to see if this helps with her discomfort, pain, spasm    Azalia Bilis, MD 05/02/15 1154

## 2015-06-05 ENCOUNTER — Telehealth: Payer: Self-pay | Admitting: Family Medicine

## 2015-06-05 MED ORDER — HYDROCHLOROTHIAZIDE 25 MG PO TABS: 25.0000 mg | ORAL_TABLET | Freq: Every day | ORAL | Status: DC

## 2015-06-05 MED ORDER — LAMOTRIGINE 200 MG PO TABS: 300.0000 mg | ORAL_TABLET | Freq: Every day | ORAL | Status: DC

## 2015-06-05 MED ORDER — LEVOTHYROXINE SODIUM 50 MCG PO TABS: 50.0000 ug | ORAL_TABLET | Freq: Every day | ORAL | Status: DC

## 2015-06-05 NOTE — Telephone Encounter (Signed)
Needs refills on lamictal, synthroid and hctz  Her insurance ends/changes on Monday and she is wanting to get refills before the change  [facility]

## 2015-06-05 NOTE — Telephone Encounter (Signed)
Done

## 2015-07-08 ENCOUNTER — Encounter: Payer: Self-pay | Admitting: Family Medicine

## 2015-07-08 ENCOUNTER — Ambulatory Visit (INDEPENDENT_AMBULATORY_CARE_PROVIDER_SITE_OTHER): Payer: 59 | Admitting: Family Medicine

## 2015-07-08 ENCOUNTER — Other Ambulatory Visit: Payer: Self-pay

## 2015-07-08 VITALS — BP 120/60 | Ht 67.0 in | Wt 209.8 lb

## 2015-07-08 DIAGNOSIS — E039 Hypothyroidism, unspecified: Secondary | ICD-10-CM | POA: Diagnosis not present

## 2015-07-08 DIAGNOSIS — L989 Disorder of the skin and subcutaneous tissue, unspecified: Secondary | ICD-10-CM

## 2015-07-08 DIAGNOSIS — F3170 Bipolar disorder, currently in remission, most recent episode unspecified: Secondary | ICD-10-CM

## 2015-07-08 DIAGNOSIS — E669 Obesity, unspecified: Secondary | ICD-10-CM

## 2015-07-08 LAB — TSH: TSH: 1.85 m[IU]/L

## 2015-07-08 MED ORDER — LORCASERIN HCL 10 MG PO TABS: 10.0000 mg | ORAL_TABLET | Freq: Two times a day (BID) | ORAL | Status: DC

## 2015-07-08 NOTE — Progress Notes (Signed)
   Subjective:    Patient ID: Yolanda Weaver, female    DOB: [birth year], 45 y.o.   MRN: [id number]  HPI She is here for follow-up visit concerning several issues. She would likely be placed back on her weight loss medication. When she was on it she lost about 20 pounds but then ran into some back trouble and had to stop the medication due to possible drug interactions. She also needs thyroid studies to follow-up on her hypothyroid. She does have bipolar disease and is interested in potentially cutting back on her medication although she seems to be doing quite nicely with that. She also has a lesion on her back that she says it has changed colors.   Review of Systems     Objective:   Physical Exam Alert and in no distress. A 1cm round brownish lesion with well-demarcated borders is noted on the mid back area.       Assessment & Plan:  Skin lesion of back  Obesity - Plan: Lorcaserin HCl (BELVIQ) 10 MG TABS  Bipolar affective disorder in remission (HCC)  Hypothyroidism, unspecified hypothyroidism type - Plan: TSH She will return for a skin excision at her convenience. We also discussed the weight loss with her. Since she did well on [place], we'll do this again. Encouraged her to not make any changes in her medicine for the bipolar disorder as she seems to be very stable. I will check a thyroid level on her.

## 2015-07-09 MED ORDER — LAMOTRIGINE 200 MG PO TABS: 300.0000 mg | ORAL_TABLET | Freq: Every day | ORAL | Status: DC

## 2015-07-09 MED ORDER — HYDROCHLOROTHIAZIDE 25 MG PO TABS: 25.0000 mg | ORAL_TABLET | Freq: Every day | ORAL | Status: DC

## 2015-07-09 MED ORDER — LEVOTHYROXINE SODIUM 50 MCG PO TABS: 50.0000 ug | ORAL_TABLET | Freq: Every day | ORAL | Status: DC

## 2015-07-09 NOTE — Addendum Note (Signed)
Addended by: Ronnald NianLALONDE, JOHN C on: 07/09/2015 08:27 AM   Modules accepted: Orders

## 2015-07-17 ENCOUNTER — Telehealth: Payer: Self-pay

## 2015-07-17 ENCOUNTER — Telehealth: Payer: Self-pay | Admitting: Internal Medicine

## 2015-07-17 ENCOUNTER — Other Ambulatory Visit: Payer: Self-pay

## 2015-07-17 NOTE — Telephone Encounter (Signed)
Pt called asking about a prior auth for belviq but when i called her pharmacy, they said that its not asking for a prior auth that her insurance just isn't covering the medicine. Pharmacy states that a lot of weight loss meds are not covered but something trying phentermine, the cost isn't too expensive out of pocket. Please advise.

## 2015-07-17 NOTE — Telephone Encounter (Signed)
Faxed request from [facility] for hydrocholorthiazide, levothyroxine, and lamotigine tabs. Trixie Rude/RLB

## 2015-07-17 NOTE — Telephone Encounter (Signed)
I'm not comfortable with that medication. She will need to pay out of pocket mainly because of her insurance.

## 2015-07-17 NOTE — Telephone Encounter (Signed)
Left word for word message on cell 

## 2015-07-17 NOTE — Telephone Encounter (Signed)
Pt called stating she got a message about the prior auth she was wanting us to do for belviq. [place] [facility] who said that the medication did not need a prior auth just was not covered. Pt states this is wrong and would like you to call her insurance and speak to them about the prior auth because she said the pharmacy is wrong. Informed her you where out of the office and would handle it when you come back. She would like a call from you in regards to this.

## 2015-07-17 NOTE — Telephone Encounter (Signed)
Dr.Lalonde has already [facility]

## 2015-07-18 ENCOUNTER — Telehealth: Payer: Self-pay | Admitting: Family Medicine

## 2015-07-21 MED ORDER — PHENTERMINE HCL 30 MG PO CAPS: 30.0000 mg | ORAL_CAPSULE | ORAL | Status: DC

## 2015-07-21 NOTE — Telephone Encounter (Signed)
P.A. Denied, plan exclusion.  Called [facility] T# [phone] and they state weight loss meds are plan exclusion.  I checked on Qsymia, Saxenda, Contrave & Phentermine and all not covered.  Only option is an appeal fax # [phone], no phone #, and they are faxing a standard form here

## 2015-07-21 NOTE — Telephone Encounter (Signed)
Let's not fight this. Let her know I will call him phentermine if she wants rather than challenge it and explain that it probably won't be covered even if we filed an appeal

## 2015-07-21 NOTE — Telephone Encounter (Signed)
Insurance will not cover the other weight loss medications. I will call in Phentermine . She is to recheck here in one month.

## 2015-07-21 NOTE — Telephone Encounter (Signed)
Let her know that I called the medication in and have her come back here in one month for a recheck.

## 2015-07-21 NOTE — Telephone Encounter (Signed)
Pt informed and verbalized understanding

## 2015-07-21 NOTE — Telephone Encounter (Signed)
Called pt & explained and she is willing to try it.  Please call in Phentermine

## 2015-07-21 NOTE — Telephone Encounter (Signed)
Called pt & informed & she would like us to do an appeal.  It is highly unlikely they will cover because it is an exclusion.  Other option is cash pay for Phentermine which is the cheapest out of pocket medication.  Can she be switched to Phentermine with her other meds?

## 2015-07-22 ENCOUNTER — Telehealth: Payer: Self-pay

## 2015-07-22 MED ORDER — LEVOTHYROXINE SODIUM 50 MCG PO TABS: 50.0000 ug | ORAL_TABLET | Freq: Every day | ORAL | Status: DC

## 2015-07-22 MED ORDER — LAMOTRIGINE 200 MG PO TABS: 300.0000 mg | ORAL_TABLET | Freq: Every day | ORAL | Status: DC

## 2015-07-22 MED ORDER — HYDROCHLOROTHIAZIDE 25 MG PO TABS: 25.0000 mg | ORAL_TABLET | Freq: Every day | ORAL | Status: DC

## 2015-07-22 NOTE — Telephone Encounter (Signed)
Pt request all scripts sent to [facility] instead of [facility]. Trixie Rude/RLB

## 2015-08-18 ENCOUNTER — Other Ambulatory Visit: Payer: Self-pay | Admitting: Family Medicine

## 2015-08-18 ENCOUNTER — Ambulatory Visit (INDEPENDENT_AMBULATORY_CARE_PROVIDER_SITE_OTHER): Payer: 59 | Admitting: Family Medicine

## 2015-08-18 ENCOUNTER — Encounter: Payer: Self-pay | Admitting: Family Medicine

## 2015-08-18 VITALS — BP 150/90 | HR 78 | Resp 18 | Ht 66.6 in | Wt 201.2 lb

## 2015-08-18 DIAGNOSIS — L989 Disorder of the skin and subcutaneous tissue, unspecified: Secondary | ICD-10-CM

## 2015-08-18 NOTE — Progress Notes (Signed)
   Subjective:    Patient ID: Yolanda PeaLaurie T Weaver, female    DOB: [birth year], 45 y.o.   MRN: [id number]  HPI She is here for vision of the lesion on her mid back area. Sometimes does cause difficulty with itching. She also is recovering from previous back surgery. She is becoming more physically active. He has concerns over weight loss. She also states that her neck is cracking and popping however she is not having any other neurologic symptoms.   Review of Systems     Objective:   Physical Exam 1 cm confluence brownish lesion with well-demarcated borders is noted.      Assessment & Plan:  Skin lesion of back Lesion was injected with Xylocaine and epinephrine and excised without difficulty. The base was cauterized with silver nitrate. Also discussed her weight loss. Strongly encouraged her to increase her physical activity as well as cutting back on carbohydrates. Discussed the cracking of the neck and explained that since that's the only symptoms she is having that does not necessarily indicate any cervical disc disease. If her symptoms worsen, she will let me know

## 2015-09-21 ENCOUNTER — Other Ambulatory Visit: Payer: Self-pay | Admitting: Family Medicine

## 2015-09-21 NOTE — Telephone Encounter (Signed)
Dr.Lalonde is this okay to refill 

## 2015-09-21 NOTE — Telephone Encounter (Signed)
Pt states she has lost 12lb

## 2015-09-21 NOTE — Telephone Encounter (Signed)
Call her 

## 2015-09-21 NOTE — Telephone Encounter (Signed)
Find out if she has lost any weight

## 2015-09-21 NOTE — Telephone Encounter (Signed)
Is this okay to refill? 

## 2015-09-21 NOTE — Telephone Encounter (Signed)
Ok to continue

## 2015-09-21 NOTE — Telephone Encounter (Signed)
Call this in 

## 2015-09-22 NOTE — Telephone Encounter (Signed)
Called in.

## 2015-11-23 ENCOUNTER — Other Ambulatory Visit: Payer: Self-pay | Admitting: Family Medicine

## 2015-11-23 NOTE — Telephone Encounter (Signed)
Okay to renew but have her come in for an appointment in about a month

## 2015-11-23 NOTE — Telephone Encounter (Signed)
Is this okay to refill? 

## 2015-11-23 NOTE — Telephone Encounter (Signed)
Ask about her weight loss and how much she has lost

## 2015-11-23 NOTE — Telephone Encounter (Signed)
Pt has lost 26 lb so far

## 2015-12-23 ENCOUNTER — Encounter: Payer: Self-pay | Admitting: Family Medicine

## 2015-12-23 ENCOUNTER — Ambulatory Visit (INDEPENDENT_AMBULATORY_CARE_PROVIDER_SITE_OTHER): Payer: 59 | Admitting: Family Medicine

## 2015-12-23 VITALS — BP 120/80 | HR 93 | Wt 190.0 lb

## 2015-12-23 DIAGNOSIS — E669 Obesity, unspecified: Secondary | ICD-10-CM

## 2015-12-23 DIAGNOSIS — Z23 Encounter for immunization: Secondary | ICD-10-CM | POA: Diagnosis not present

## 2015-12-23 MED ORDER — PHENTERMINE HCL 30 MG PO CAPS: ORAL_CAPSULE | ORAL | 3 refills | Status: DC

## 2015-12-23 NOTE — Progress Notes (Signed)
   Subjective:    Patient ID: Tiburcio PeaLaurie T Eldredge, female    DOB: [birth year], 45 y.o.   MRN: [id number]  HPI She is here for a medication management visit. She has been on phentermine for several months now and has lost roughly 20 pounds. She notes that it helps to decrease her appetite and increase her energy. Her diet has been fairly good. 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 She would like to continue on this medicine.   Review of Systems     Objective:   Physical Exam Alert and in no distress otherwise not examined.       Assessment & Plan:  Obesity without serious comorbidity, unspecified classification, unspecified obesity type - Plan: phentermine 30 MG capsule  Need for prophylactic vaccination and inoculation against influenza - Plan: Flu Vaccine QUAD 36+ mos PF IM (Fluarix & Fluzone Quad PF) I encouraged her to continue with this. I will recheck her several months. Flu shot given today.

## 2016-02-05 ENCOUNTER — Other Ambulatory Visit: Payer: Self-pay | Admitting: Family Medicine

## 2016-02-05 DIAGNOSIS — E669 Obesity, unspecified: Secondary | ICD-10-CM

## 2016-02-05 NOTE — Telephone Encounter (Signed)
I have called the med in

## 2016-02-05 NOTE — Telephone Encounter (Signed)
ok 

## 2016-02-05 NOTE — Telephone Encounter (Signed)
Is this okay to refill? 

## 2016-02-13 ENCOUNTER — Other Ambulatory Visit: Payer: Self-pay | Admitting: Family Medicine

## 2016-02-13 MED ORDER — AMOXICILLIN-POT CLAVULANATE 875-125 MG PO TABS: 1.0000 | ORAL_TABLET | Freq: Two times a day (BID) | ORAL | 0 refills | Status: DC

## 2016-05-24 ENCOUNTER — Other Ambulatory Visit: Payer: Self-pay | Admitting: Family Medicine

## 2016-05-24 DIAGNOSIS — E669 Obesity, unspecified: Secondary | ICD-10-CM

## 2016-05-24 NOTE — Telephone Encounter (Signed)
Is this okay to refill? 

## 2016-05-25 NOTE — Telephone Encounter (Signed)
Sent mychart message to patient

## 2016-05-25 NOTE — Telephone Encounter (Signed)
She needs a follow-up appointment before I can continue this.

## 2016-06-27 ENCOUNTER — Encounter: Payer: Self-pay | Admitting: Family Medicine

## 2016-06-27 ENCOUNTER — Ambulatory Visit (INDEPENDENT_AMBULATORY_CARE_PROVIDER_SITE_OTHER): Payer: 59 | Admitting: Family Medicine

## 2016-06-27 VITALS — BP 130/80 | HR 92 | Ht 67.0 in | Wt 193.0 lb

## 2016-06-27 DIAGNOSIS — E669 Obesity, unspecified: Secondary | ICD-10-CM

## 2016-06-27 DIAGNOSIS — Z713 Dietary counseling and surveillance: Secondary | ICD-10-CM | POA: Diagnosis not present

## 2016-06-27 MED ORDER — PHENTERMINE HCL 30 MG PO CAPS: 30.0000 mg | ORAL_CAPSULE | Freq: Every morning | ORAL | 1 refills | Status: DC

## 2016-06-27 NOTE — Progress Notes (Signed)
   Subjective:    Patient ID: Yolanda Weaver, female    DOB: [birth year], 46 y.o.   MRN: [id number]  HPI She is here for consultation concerning weight loss. She has been taking phentermine but did stop in January. 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 She would like to get started taking this again. While she was on it she did lose approximately 17 pounds. She also is apparently being scheduled to potentially have cervical disc surgery. She will be discussing this with her neurosurgeon in the near future. This will apparently not take place until sometime in July at the fairly early us.   Review of Systems     Objective:   Physical Exam Alert and in no distress otherwise not examined       Assessment & Plan:  Weight loss counseling, encounter for  Obesity without serious comorbidity, unspecified classification, unspecified obesity type - Plan: phentermine 30 MG capsule I discussed the weight loss with her in detail. Mention the fact that she still needs to work on her diet and exercise. We will start her back on phentermine. She has set a goal weight of roughly 160 or a dress size of 10. She is to touch bases with me again with an appointment in approximately 2 months.

## 2016-06-29 ENCOUNTER — Telehealth: Payer: Self-pay | Admitting: Family Medicine

## 2016-06-29 NOTE — Telephone Encounter (Signed)
Pt states Phentermine not at pharmacy.  Called into [facility] per Dr.  Jola BabinskiLalonde's notes

## 2016-07-21 HISTORY — PX: NECK SURGERY: SHX720

## 2016-07-27 ENCOUNTER — Other Ambulatory Visit: Payer: Self-pay | Admitting: Family Medicine

## 2016-07-28 NOTE — Telephone Encounter (Signed)
Is this okay to refill? 

## 2016-07-28 NOTE — Telephone Encounter (Signed)
She needs to come in for blood work and an office visit

## 2016-08-02 ENCOUNTER — Other Ambulatory Visit: Payer: Self-pay | Admitting: Neurological Surgery

## 2016-08-02 DIAGNOSIS — M542 Cervicalgia: Secondary | ICD-10-CM

## 2016-08-23 ENCOUNTER — Ambulatory Visit (INDEPENDENT_AMBULATORY_CARE_PROVIDER_SITE_OTHER): Payer: 59

## 2016-08-23 ENCOUNTER — Ambulatory Visit (HOSPITAL_BASED_OUTPATIENT_CLINIC_OR_DEPARTMENT_OTHER)
Admission: RE | Admit: 2016-08-23 | Discharge: 2016-08-23 | Disposition: A | Discharge status: Home / Self Care | Payer: Managed Care, Other (non HMO) | Source: Ambulatory Visit | Attending: Neurological Surgery | Admitting: Neurological Surgery

## 2016-08-23 ENCOUNTER — Other Ambulatory Visit: Payer: Self-pay | Admitting: Neurological Surgery

## 2016-08-23 DIAGNOSIS — M47892 Other spondylosis, cervical region: Secondary | ICD-10-CM

## 2016-08-23 DIAGNOSIS — M542 Cervicalgia: Secondary | ICD-10-CM

## 2016-08-26 ENCOUNTER — Other Ambulatory Visit: Payer: Self-pay | Admitting: Neurological Surgery

## 2016-08-26 DIAGNOSIS — M542 Cervicalgia: Secondary | ICD-10-CM

## 2016-08-31 ENCOUNTER — Encounter: Payer: Self-pay | Admitting: Family Medicine

## 2016-08-31 ENCOUNTER — Ambulatory Visit (INDEPENDENT_AMBULATORY_CARE_PROVIDER_SITE_OTHER): Payer: 59 | Admitting: Family Medicine

## 2016-08-31 VITALS — BP 120/70 | HR 80 | Wt 187.0 lb

## 2016-08-31 DIAGNOSIS — E039 Hypothyroidism, unspecified: Secondary | ICD-10-CM

## 2016-08-31 DIAGNOSIS — E669 Obesity, unspecified: Secondary | ICD-10-CM

## 2016-08-31 DIAGNOSIS — F3170 Bipolar disorder, currently in remission, most recent episode unspecified: Secondary | ICD-10-CM

## 2016-08-31 DIAGNOSIS — Z713 Dietary counseling and surveillance: Secondary | ICD-10-CM

## 2016-08-31 DIAGNOSIS — J301 Allergic rhinitis due to pollen: Secondary | ICD-10-CM | POA: Diagnosis not present

## 2016-08-31 DIAGNOSIS — G43109 Migraine with aura, not intractable, without status migrainosus: Secondary | ICD-10-CM

## 2016-08-31 DIAGNOSIS — Z981 Arthrodesis status: Secondary | ICD-10-CM

## 2016-08-31 LAB — CBC WITH DIFFERENTIAL/PLATELET
BASOS ABS: 0 {cells}/uL (ref 0–200)
BASOS PCT: 0 %
EOS PCT: 2 %
Eosinophils Absolute: 138 cells/uL (ref 15–500)
HCT: 39.6 % (ref 35.0–45.0)
HEMOGLOBIN: 13.2 g/dL (ref 11.7–15.5)
LYMPHS ABS: 1725 {cells}/uL (ref 850–3900)
Lymphocytes Relative: 25 %
MCH: 31.1 pg (ref 27.0–33.0)
MCHC: 33.3 g/dL (ref 32.0–36.0)
MCV: 93.4 fL (ref 80.0–100.0)
MPV: 8.5 fL (ref 7.5–12.5)
Monocytes Absolute: 414 cells/uL (ref 200–950)
Monocytes Relative: 6 %
NEUTROS ABS: 4623 {cells}/uL (ref 1500–7800)
Neutrophils Relative %: 67 %
PLATELETS: 308 10*3/uL (ref 140–400)
RBC: 4.24 MIL/uL (ref 3.80–5.10)
RDW: 13 % (ref 11.0–15.0)
WBC: 6.9 10*3/uL (ref 4.0–10.5)

## 2016-08-31 LAB — LIPID PANEL
CHOLESTEROL: 153 mg/dL (ref <200)
HDL: 51 mg/dL (ref >50)
LDL Cholesterol: 87 mg/dL (ref <100)
Total CHOL/HDL Ratio: 3 Ratio (ref <5.0)
Triglycerides: 74 mg/dL (ref <150)
VLDL: 15 mg/dL (ref <30)

## 2016-08-31 LAB — TSH: TSH: 1.56 m[IU]/L

## 2016-08-31 LAB — COMPREHENSIVE METABOLIC PANEL
ALBUMIN: 4.4 g/dL (ref 3.6–5.1)
ALT: 9 U/L (ref 6–29)
AST: 12 U/L (ref 10–35)
Alkaline Phosphatase: 112 U/L (ref 33–115)
BILIRUBIN TOTAL: 0.3 mg/dL (ref 0.2–1.2)
BUN: 11 mg/dL (ref 7–25)
CO2: 23 mmol/L (ref 20–31)
CREATININE: 0.74 mg/dL (ref 0.50–1.10)
Calcium: 9.3 mg/dL (ref 8.6–10.2)
Chloride: 104 mmol/L (ref 98–110)
Glucose, Bld: 73 mg/dL (ref 65–99)
Potassium: 4 mmol/L (ref 3.5–5.3)
SODIUM: 141 mmol/L (ref 135–146)
TOTAL PROTEIN: 6.9 g/dL (ref 6.1–8.1)

## 2016-08-31 MED ORDER — PHENTERMINE HCL 30 MG PO CAPS: 30.0000 mg | ORAL_CAPSULE | Freq: Every morning | ORAL | 2 refills | Status: DC

## 2016-08-31 MED ORDER — LAMOTRIGINE 200 MG PO TABS: ORAL_TABLET | ORAL | 0 refills | Status: DC

## 2016-08-31 MED ORDER — LEVOTHYROXINE SODIUM 50 MCG PO TABS: 50.0000 ug | ORAL_TABLET | Freq: Every day | ORAL | 3 refills | Status: DC

## 2016-08-31 NOTE — Progress Notes (Signed)
   Subjective:    Patient ID: Yolanda Weaver, female    DOB: 03/09/1970, 46 y.o.   MRN: [id number]  HPI She is here for a follow-up visit. She had recent cervical disc surgery with fusion and is doing well with this. She has started exercising. She continues on phentermine and continues to lose weight. She does have a goal of a size 10 panel is. She is happy with her progress. She continues to do nicely on her Lamictal for her bipolar disorder. She is interested in sometime in the future of stopping the medicine and see how she does. Her allergies are under good control. Migraine headaches are also rare and is doing well. She continues on her Synthroid and is having no skin or hair changes. Family and social history was reviewed and is unchanged  Review of Systems     Objective:   Physical Exam Alert and in no distress. Tympanic membranes and canals are normal. Pharyngeal area is normal. Neck is supple without adenopathy or thyromegaly. Cardiac exam shows a regular sinus rhythm without murmurs or gallops. Lungs are clear to auscultation. Skin is normal with some facial acne noted. DTRs normal.       Assessment & Plan:  Obesity without serious comorbidity, unspecified classification, unspecified obesity type - Plan: CBC with Differential/Platelet, Comprehensive metabolic panel, Lipid panel, phentermine 30 MG capsule  Weight loss counseling, encounter for - Plan: CBC with Differential/Platelet, Comprehensive metabolic panel, Lipid panel, phentermine 30 MG capsule  Bipolar affective disorder in remission (HCC) - Plan: CBC with Differential/Platelet, Comprehensive metabolic panel, lamoTRIgine (LAMICTAL) 200 MG tablet  Allergic rhinitis due to pollen, unspecified seasonality  Migraine with aura and without status migrainosus, not intractable  S/P cervical spinal fusion  Hypothyroidism, unspecified type - Plan: TSH, levothyroxine (SYNTHROID, LEVOTHROID) 50 MCG tablet She is stable on the  present medication regimen for the above diagnoses. I will continue her on phentermine to help her get around her weight. Did discuss increasing her physical activity and continuing to make dietary changes. Presently she is having no difficulty with allergies and migraine and seems be doing well postoperatively.

## 2016-09-27 ENCOUNTER — Other Ambulatory Visit: Payer: Self-pay | Admitting: Family Medicine

## 2016-09-27 DIAGNOSIS — Z713 Dietary counseling and surveillance: Secondary | ICD-10-CM

## 2016-09-27 DIAGNOSIS — E669 Obesity, unspecified: Secondary | ICD-10-CM

## 2016-09-27 NOTE — Telephone Encounter (Signed)
Okay to renew

## 2016-09-27 NOTE — Telephone Encounter (Signed)
Can pt have  Refill on this ?

## 2016-09-29 ENCOUNTER — Other Ambulatory Visit: Payer: Self-pay | Admitting: Family Medicine

## 2016-09-29 DIAGNOSIS — F3170 Bipolar disorder, currently in remission, most recent episode unspecified: Secondary | ICD-10-CM

## 2016-10-01 ENCOUNTER — Other Ambulatory Visit: Payer: Self-pay | Admitting: Family Medicine

## 2016-10-01 DIAGNOSIS — E039 Hypothyroidism, unspecified: Secondary | ICD-10-CM

## 2016-11-22 ENCOUNTER — Ambulatory Visit (INDEPENDENT_AMBULATORY_CARE_PROVIDER_SITE_OTHER): Payer: 59

## 2016-11-22 DIAGNOSIS — M542 Cervicalgia: Secondary | ICD-10-CM

## 2016-11-25 ENCOUNTER — Other Ambulatory Visit (INDEPENDENT_AMBULATORY_CARE_PROVIDER_SITE_OTHER): Payer: 59

## 2016-11-25 ENCOUNTER — Other Ambulatory Visit: Payer: Self-pay | Admitting: Neurological Surgery

## 2016-11-25 DIAGNOSIS — M545 Low back pain: Secondary | ICD-10-CM

## 2016-11-25 DIAGNOSIS — Z23 Encounter for immunization: Secondary | ICD-10-CM | POA: Diagnosis not present

## 2016-11-28 ENCOUNTER — Ambulatory Visit (INDEPENDENT_AMBULATORY_CARE_PROVIDER_SITE_OTHER): Payer: 59

## 2016-11-28 DIAGNOSIS — M47896 Other spondylosis, lumbar region: Secondary | ICD-10-CM | POA: Diagnosis not present

## 2016-11-28 DIAGNOSIS — M545 Low back pain: Secondary | ICD-10-CM

## 2016-11-29 ENCOUNTER — Emergency Department (HOSPITAL_COMMUNITY): Payer: 59

## 2016-11-29 ENCOUNTER — Emergency Department (HOSPITAL_COMMUNITY)
Admission: EM | Admit: 2016-11-29 | Discharge: 2016-11-29 | Disposition: A | Discharge status: Home / Self Care | Payer: 59 | Attending: Emergency Medicine | Admitting: Emergency Medicine

## 2016-11-29 ENCOUNTER — Encounter (HOSPITAL_COMMUNITY): Payer: Self-pay | Admitting: Emergency Medicine

## 2016-11-29 DIAGNOSIS — Y998 Other external cause status: Secondary | ICD-10-CM | POA: Diagnosis not present

## 2016-11-29 DIAGNOSIS — Y9389 Activity, other specified: Secondary | ICD-10-CM | POA: Diagnosis not present

## 2016-11-29 DIAGNOSIS — S59901A Unspecified injury of right elbow, initial encounter: Secondary | ICD-10-CM | POA: Diagnosis present

## 2016-11-29 DIAGNOSIS — F419 Anxiety disorder, unspecified: Secondary | ICD-10-CM | POA: Diagnosis not present

## 2016-11-29 DIAGNOSIS — S5001XA Contusion of right elbow, initial encounter: Secondary | ICD-10-CM | POA: Insufficient documentation

## 2016-11-29 DIAGNOSIS — Z87891 Personal history of nicotine dependence: Secondary | ICD-10-CM | POA: Diagnosis not present

## 2016-11-29 DIAGNOSIS — S5000XA Contusion of unspecified elbow, initial encounter: Secondary | ICD-10-CM

## 2016-11-29 DIAGNOSIS — W010XXA Fall on same level from slipping, tripping and stumbling without subsequent striking against object, initial encounter: Secondary | ICD-10-CM | POA: Diagnosis not present

## 2016-11-29 DIAGNOSIS — Y92002 Bathroom of unspecified non-institutional (private) residence single-family (private) house as the place of occurrence of the external cause: Secondary | ICD-10-CM | POA: Diagnosis not present

## 2016-11-29 DIAGNOSIS — Z79899 Other long term (current) drug therapy: Secondary | ICD-10-CM | POA: Insufficient documentation

## 2016-11-29 DIAGNOSIS — I10 Essential (primary) hypertension: Secondary | ICD-10-CM | POA: Diagnosis not present

## 2016-11-29 DIAGNOSIS — E039 Hypothyroidism, unspecified: Secondary | ICD-10-CM | POA: Diagnosis not present

## 2016-11-29 DIAGNOSIS — S5002XA Contusion of left elbow, initial encounter: Secondary | ICD-10-CM | POA: Diagnosis not present

## 2016-11-29 DIAGNOSIS — F319 Bipolar disorder, unspecified: Secondary | ICD-10-CM | POA: Insufficient documentation

## 2016-11-29 MED ORDER — KETOROLAC TROMETHAMINE 60 MG/2ML IM SOLN
60.0000 mg | Freq: Once | INTRAMUSCULAR | Status: AC
  Administered 2016-11-29: 60 mg via INTRAMUSCULAR
  Filled 2016-11-29: qty 2

## 2016-11-29 NOTE — ED Triage Notes (Signed)
Patient complaining of a fall in the bathtub. Patient complains of hitting everything on her body. Patient complains the most about both elbows. Patient states she has rods in her back. This incident happened about 2 to 3 hours ago.

## 2016-11-29 NOTE — ED Provider Notes (Signed)
[facility]-EMERGENCY DEPT Provider Note   CSN: [id number] Arrival date & time: 11/29/16  0231     History   Chief Complaint Chief Complaint  Patient presents with  . Fall    HPI Yolanda Weaver is a 46 y.o. female.  Patient presents to the emergency department for evaluation of injuries from a fall.  Patient reports that she slipped in the bathtub and fell, injuring both elbows.  Ports constant pain that worsens with movement of the elbows.  She did not hit her head or lose consciousness.  She did have cervical fusion surgery 4 weeks ago and is concerned about her neck, but is not having any specific neck pain.  She does have some upper back pain.      Past Medical History:  Diagnosis Date  . Acne   . Allergy   . Anxiety   . Bipolar 1 disorder (HCC)   . Blood transfusion    h/o tx for rh incompatibility during pregnancy. see note re significant antibody  . Contraception   . Depression   . Hypertension   . Hypothyroid   . Mental disorder    bipolar  . Migraine   . Obesity   . PONV (postoperative nausea and vomiting)   . Ptosis, right eyelid   . Recurrent upper respiratory infection (URI)    reports she had bronchitis, treated /w antibiotics in past month    Patient Active Problem List   Diagnosis Date Noted  . S/P cervical spinal fusion 08/31/2016  . S/P lumbar spinal fusion 01/15/2015  . Hypothyroid 07/01/2010  . Migraine headache 07/01/2010  . Bipolar disorder (HCC) 07/01/2010  . Allergic rhinitis 07/01/2010    Past Surgical History:  Procedure Laterality Date  . DIAGNOSTIC LAPAROSCOPY     evaluation for endometriosis  . FOOT FOREIGN BODY REMOVAL    . KNEE ARTHROSCOPY     x3 r  . LUMBAR FUSION  01/15/2015  . LUMBAR LAMINECTOMY  04/08/2011   Procedure: MICRODISCECTOMY LUMBAR LAMINECTOMY;  Surgeon: Eldred Manges, MD;  Location: [facility] OR;  Service: Orthopedics;  Laterality: Right;  Right L5-S1 Microdiscectomy  . MAXIMUM ACCESS  (MAS)POSTERIOR LUMBAR INTERBODY FUSION (PLIF) 1 LEVEL N/A 01/15/2015   Procedure: LUMBAR FIVE-SACRA L-ONE MAXIMUM ACCESS SURGERY POSTERIOR LUMBAR INTERBODY FUSION ;  Surgeon: Tia Alert, MD;  Location: [facility];  Service: Neurosurgery;  Laterality: N/A;  . THYROIDECTOMY, PARTIAL      OB History    No data available       Home Medications    Prior to Admission medications   Medication Sig Start Date End Date Taking? Authorizing Provider  hyoscyamine (LEVSIN/SL) 0.125 MG SL tablet Place 1 tablet (0.125 mg total) under the tongue every 4 (four) hours as needed for cramping. Patient not taking: Reported on 08/31/2016 07/09/13   Ronnald Nian, MD  ibuprofen (ADVIL,MOTRIN) 800 MG tablet TAKE 1 TABLET BY MOUTH EVERY 8 HOURS AS NEEDED WITH FOOD 02/13/13   Delories Heinz, DPM  lamoTRIgine (LAMICTAL) 200 MG tablet TAKE 1 AND 1/2 TABLETS BY  MOUTH AT BEDTIME 09/30/16   Ronnald Nian, MD  levothyroxine (SYNTHROID, LEVOTHROID) 50 MCG tablet TAKE 1 TABLET BY MOUTH AT  BEDTIME 10/03/16   Ronnald Nian, MD  phentermine 30 MG capsule TAKE 1 CAPSULE EVERY MORNING 09/27/16   Ronnald Nian, MD  tiZANidine (ZANAFLEX) 4 MG tablet Take 4 mg by mouth every 6 (six) hours as needed for muscle spasms.  [provider]    Family History Family History  Problem Relation Age of Onset  . COPD Father   . Anesthesia problems Neg Hx   . Hypotension Neg Hx   . Malignant hyperthermia Neg Hx   . Pseudochol deficiency Neg Hx   . Cancer Other   . Hypertension Other   . Diabetes Other     Social History Social History  Substance Use Topics  . Smoking status: Former Smoker    Quit date: 02/07/2001  . Smokeless tobacco: Never Used  . Alcohol use 0.6 oz/week    1 Glasses of wine per week     Comment: couple times a week     Allergies   Acetaminophen; Dilaudid [hydromorphone hcl]; Erythromycin; Hydrocodone; and Oxycodone   Review of Systems Review of Systems  Musculoskeletal: Positive  for arthralgias and back pain.  All other systems reviewed and are negative.    Physical Exam Updated Vital Signs BP (!) 163/102 (BP Location: Right Arm)   Pulse 73   Temp 97.9 F (36.6 C) (Oral)   Resp 18   Ht 5' 6.5" (1.689 m)   Wt 83.9 kg (185 lb)   SpO2 100%   BMI 29.41 kg/m   Physical Exam  Constitutional: She is oriented to person, place, and time. She appears well-developed and well-nourished. No distress.  HENT:  Head: Normocephalic and atraumatic.  Right Ear: Hearing normal.  Left Ear: Hearing normal.  Nose: Nose normal.  Mouth/Throat: Oropharynx is clear and moist and mucous membranes are normal.  Eyes: Pupils are equal, round, and reactive to light. Conjunctivae and EOM are normal.  Neck: Normal range of motion. Neck supple. No spinous process tenderness and no muscular tenderness present.  Cardiovascular: Regular rhythm, S1 normal and S2 normal.  Exam reveals no gallop and no friction rub.   No murmur heard. Pulmonary/Chest: Effort normal and breath sounds normal. No respiratory distress. She exhibits no tenderness.  Abdominal: Soft. Normal appearance and bowel sounds are normal. There is no hepatosplenomegaly. There is no tenderness. There is no rebound, no guarding, no tenderness at McBurney's point and negative Murphy's sign. No hernia.  Musculoskeletal: Normal range of motion.       Right elbow: She exhibits no swelling, no effusion and no deformity. Tenderness found. Olecranon process tenderness noted. No radial head tenderness noted.       Left elbow: She exhibits no swelling, no effusion and no deformity. Tenderness found. Olecranon process tenderness noted. No radial head tenderness noted.       Thoracic back: She exhibits tenderness.       Back:  Neurological: She is alert and oriented to person, place, and time. She has normal strength. No cranial nerve deficit or sensory deficit. Coordination normal. GCS eye subscore is 4. GCS verbal subscore is 5. GCS  motor subscore is 6.  Skin: Skin is warm, dry and intact. No rash noted. No cyanosis.  Psychiatric: She has a normal mood and affect. Her speech is normal and behavior is normal. Thought content normal.  Nursing note and vitals reviewed.    ED Treatments / Results  Labs (all labs ordered are listed, but only abnormal results are displayed) Labs Reviewed - No data to display  EKG  EKG Interpretation None       Radiology Dg Elbow Complete Left  Result Date: 11/29/2016 CLINICAL DATA:  Status post fall in bathtub, with left elbow pain. Initial encounter. EXAM: LEFT ELBOW - COMPLETE 3+ VIEW COMPARISON:  None.  FINDINGS: There is no evidence of fracture or dislocation. The visualized joint spaces are preserved. No significant joint effusion is identified. The soft tissues are unremarkable in appearance. IMPRESSION: No evidence of fracture or dislocation. Electronically Signed   By: Roanna RaiderJeffery  Chang M.D.   On: 11/29/2016 03:43   Dg Elbow Complete Right  Result Date: 11/29/2016 CLINICAL DATA:  Status post fall in bathtub, with right elbow pain. Initial encounter. EXAM: RIGHT ELBOW - COMPLETE 3+ VIEW COMPARISON:  None. FINDINGS: There is no evidence of fracture or dislocation. The visualized joint spaces are preserved. No significant joint effusion is identified. The soft tissues are unremarkable in appearance. IMPRESSION: No evidence of fracture or dislocation. Electronically Signed   By: Roanna RaiderJeffery  Chang M.D.   On: 11/29/2016 03:43   Ct Lumbar Spine Wo Contrast  Result Date: 11/28/2016 CLINICAL DATA:  Right-sided low back pain radiating to the right leg, 2 months duration. EXAM: CT LUMBAR SPINE WITHOUT CONTRAST TECHNIQUE: Multidetector CT imaging of the lumbar spine was performed without intravenous contrast administration. Multiplanar CT image reconstructions were also generated. COMPARISON:  MRI 05/02/2015 FINDINGS: Segmentation: 5 lumbar type vertebral bodies. Alignment: Normal Vertebrae: No  primary bone lesion.  Solid fusion at L5-S1. Paraspinal and other soft tissues: Negative Disc levels: No abnormality at L3-4 or above. The discs are normal. The canal and foramina are widely patent. L4-5: No disc abnormality. Minimal facet degeneration left more than right. Some potential for the L5 pedicle screws to interact with the inferior aspect of the facet joints. L5-S1: Previous posterior decompression, diskectomy and fusion. Fusion is solid. No hardware loosening. No stenosis. Mild sacroiliac osteoarthritis is present. IMPRESSION: Good appearance of the fusion level of L5-S1. Solid fusion with wide patency of the canal and foramina. No hardware loosening. Some potential that the L5 screws could interact with the inferior aspect of the L4-5 facet joints. This is a minimal and debatable finding. Mild facet osteoarthritis at L4-5. Electronically Signed   By: Paulina FusiMark  Shogry M.D.   On: 11/28/2016 11:16    Procedures Procedures (including critical care time)  Medications Ordered in ED Medications  ketorolac (TORADOL) injection 60 mg (not administered)     Initial Impression / Assessment and Plan / ED Course  I have reviewed the triage vital signs and the nursing notes.  Pertinent labs & imaging results that were available during my care of the patient were reviewed by me and considered in my medical decision making (see chart for details).     Patient complaining of bilateral elbow pain and upper back pain after a fall.  Patient slipped in the bathtub.  She is not sure of the mechanism, but then up in a seated position.  She is experiencing tenderness over both olecranon processes.  No radial head tenderness.  X-rays of both elbows are negative, no joint effusion, no fracture noted.  Patient counseled that there is still a chance of subtle radial head fracture, although mechanism seems unlikely.  She was counseled she should follow-up with her primary doctor and have x-rays repeated in a week if  she has not improving.  Patient recently had cervical fusion surgery.  She did not hit her neck directly and there is no tenderness over the midline of the cervical spine.  She did have some mild tenderness in the area of T1 or T2, but this was also paraspinal in nature and likely soft tissue.  Final Clinical Impressions(s) / ED Diagnoses   Final diagnoses:  Contusion of elbow, unspecified  laterality, initial encounter    New Prescriptions New Prescriptions   No medications on file     Gilda Crease, MD 11/29/16 [phone]

## 2016-12-23 ENCOUNTER — Other Ambulatory Visit: Payer: Self-pay | Admitting: Family Medicine

## 2016-12-23 DIAGNOSIS — F3170 Bipolar disorder, currently in remission, most recent episode unspecified: Secondary | ICD-10-CM

## 2016-12-26 NOTE — Telephone Encounter (Signed)
Please advise on Refill

## 2017-03-07 ENCOUNTER — Encounter: Payer: Self-pay | Admitting: Podiatry

## 2017-03-07 ENCOUNTER — Ambulatory Visit (INDEPENDENT_AMBULATORY_CARE_PROVIDER_SITE_OTHER): Payer: 59

## 2017-03-07 ENCOUNTER — Ambulatory Visit (INDEPENDENT_AMBULATORY_CARE_PROVIDER_SITE_OTHER): Payer: 59 | Admitting: Podiatry

## 2017-03-07 VITALS — BP 126/81 | HR 70 | Ht 66.0 in | Wt 183.0 lb

## 2017-03-07 DIAGNOSIS — M722 Plantar fascial fibromatosis: Secondary | ICD-10-CM | POA: Diagnosis not present

## 2017-03-07 DIAGNOSIS — M7662 Achilles tendinitis, left leg: Secondary | ICD-10-CM | POA: Diagnosis not present

## 2017-03-07 DIAGNOSIS — M216X1 Other acquired deformities of right foot: Secondary | ICD-10-CM

## 2017-03-07 DIAGNOSIS — M216X9 Other acquired deformities of unspecified foot: Secondary | ICD-10-CM

## 2017-03-07 DIAGNOSIS — M7661 Achilles tendinitis, right leg: Secondary | ICD-10-CM

## 2017-03-07 DIAGNOSIS — M216X2 Other acquired deformities of left foot: Secondary | ICD-10-CM | POA: Diagnosis not present

## 2017-03-07 MED ORDER — MELOXICAM 15 MG PO TABS: 15.0000 mg | ORAL_TABLET | Freq: Every day | ORAL | 0 refills | Status: DC

## 2017-03-07 NOTE — Patient Instructions (Signed)

## 2017-03-07 NOTE — Progress Notes (Signed)
Subjective:  Patient ID: Yolanda Weaver, female    DOB: 12-08-1970,  MRN: [id number]  Chief Complaint  Patient presents with  . Foot Pain    bilateral heel pain    47 y.o. female presents for heel pain.  Bilateral heel pain for several months duration.  Denies prior treatments.  States that she takes ibuprofen 800 mg for back and neck pain and this does not help she still has pain.  Denies nausea vomiting fever chills today.  Past Medical History:  Diagnosis Date  . Acne   . Allergy   . Anxiety   . Bipolar 1 disorder (HCC)   . Blood transfusion    h/o tx for rh incompatibility during pregnancy. see note re significant antibody  . Contraception   . Depression   . Hypertension   . Hypothyroid   . Mental disorder    bipolar  . Migraine   . Obesity   . PONV (postoperative nausea and vomiting)   . Ptosis, right eyelid   . Recurrent upper respiratory infection (URI)    reports she had bronchitis, treated /w antibiotics in past month   Past Surgical History:  Procedure Laterality Date  . DIAGNOSTIC LAPAROSCOPY     evaluation for endometriosis  . FOOT FOREIGN BODY REMOVAL    . KNEE ARTHROSCOPY     x3 r  . LUMBAR FUSION  01/15/2015  . LUMBAR LAMINECTOMY  04/08/2011   Procedure: MICRODISCECTOMY LUMBAR LAMINECTOMY;  Surgeon: Eldred MangesMark C Yates, MD;  [facility];  Service: Orthopedics;  Laterality: Right;  Right L5-S1 Microdiscectomy  . MAXIMUM ACCESS (MAS)POSTERIOR LUMBAR INTERBODY FUSION (PLIF) 1 LEVEL N/A 01/15/2015   Procedure: LUMBAR FIVE-SACRA L-ONE MAXIMUM ACCESS SURGERY POSTERIOR LUMBAR INTERBODY FUSION ;  Surgeon: Tia Alertavid S Jones, MD;  Location: [facility];  Service: Neurosurgery;  Laterality: N/A;  . THYROIDECTOMY, PARTIAL      Current Outpatient Medications:  .  ibuprofen (ADVIL,MOTRIN) 800 MG tablet, TAKE 1 TABLET BY MOUTH EVERY 8 HOURS AS NEEDED WITH FOOD (Patient taking differently: TAKE 1 TABLET BY MOUTH EVERY 8 HOURS AS NEEDED WITH FOOD FOR PAIN), Disp: 90 tablet, Rfl:  2 .  lamoTRIgine (LAMICTAL) 200 MG tablet, TAKE 1 AND 1/2 TABLETS BY  MOUTH AT BEDTIME, Disp: 135 tablet, Rfl: 1 .  levonorgestrel (MIRENA, 52 MG,) 20 MCG/24HR IUD, Mirena 20 mcg/24 hr (5 years) intrauterine device  Take by intrauterine route., Disp: , Rfl:  .  levothyroxine (SYNTHROID, LEVOTHROID) 50 MCG tablet, TAKE 1 TABLET BY MOUTH AT  BEDTIME, Disp: 90 tablet, Rfl: 3 .  phentermine 30 MG capsule, TAKE 1 CAPSULE EVERY MORNING, Disp: 30 capsule, Rfl: 1 .  tiZANidine (ZANAFLEX) 4 MG tablet, Take 4 mg by mouth every 6 (six) hours as needed for muscle spasms., Disp: , Rfl:  .  hyoscyamine (LEVSIN/SL) 0.125 MG SL tablet, Place 1 tablet (0.125 mg total) under the tongue every 4 (four) hours as needed for cramping. (Patient not taking: Reported on 08/31/2016), Disp: 30 tablet, Rfl: 2 .  meloxicam (MOBIC) 15 MG tablet, Take 1 tablet (15 mg total) by mouth daily., Disp: 30 tablet, Rfl: 0  Allergies  Allergen Reactions  . Latex Rash  . Acetaminophen Nausea And Vomiting  . Dilaudid [Hydromorphone Hcl] Nausea And Vomiting  . Erythromycin Rash  . Hydrocodone Nausea And Vomiting  . Oxycodone Nausea And Vomiting    Review of systems all systems reviewed and negative except as noted in the HPI  Objective:   Vitals:  03/07/17 1003  BP: 126/81  Pulse: 70   General AA&O x3. Normal mood and affect.  Vascular Dorsalis pedis and posterior tibial pulses  present 2+ bilaterally  Capillary refill normal to all digits. Pedal hair growth normal.  Neurologic Epicritic sensation grossly present bilaterally.  Dermatologic No open lesions. Interspaces clear of maceration. Nails well groomed and normal in appearance.  Orthopedic: MMT 5/5 in dorsiflexion, plantarflexion, inversion, and eversion bilaterally. Tender to palpation at the calcaneal tuber bilaterally, posterior calcaneus No pain with calcaneal squeeze bilaterally. Ankle ROM diminished range of motion bilaterally. Silfverskiold Test: positive  bilaterally.   Radiographs: Taken and reviewed. No acute fractures. No evidence of calcaneal stress fracture.  Assessment & Plan:  Patient was evaluated and treated and all questions answered.  Plantar Fasciitis, bilaterally - XR reviewed as above.  - Educated on icing and stretching. Instructions given.  - Injection delivered to the plantar fascia as below. - Night splint dispensed. - Rx meloxicam  Procedure: Injection Tendon/Ligament Location: Bilateral plantar fascia at the glabrous junction; medial approach. Skin Prep: Alcohol. Injectate: 1 cc 0.5% marcaine plain, 1 cc dexamethasone phosphate, 0.5 cc kenalog 10. Disposition: Patient tolerated procedure well. Injection site dressed with a band-aid.  Return in about 3 weeks (around 03/28/2017) for Plantar fasciitis.

## 2017-03-10 ENCOUNTER — Other Ambulatory Visit: Payer: Self-pay | Admitting: Student

## 2017-03-10 DIAGNOSIS — M542 Cervicalgia: Secondary | ICD-10-CM

## 2017-03-13 ENCOUNTER — Ambulatory Visit (INDEPENDENT_AMBULATORY_CARE_PROVIDER_SITE_OTHER): Payer: 59

## 2017-03-13 DIAGNOSIS — M50122 Cervical disc disorder at C5-C6 level with radiculopathy: Secondary | ICD-10-CM | POA: Diagnosis not present

## 2017-03-13 DIAGNOSIS — M4802 Spinal stenosis, cervical region: Secondary | ICD-10-CM

## 2017-03-13 DIAGNOSIS — M542 Cervicalgia: Secondary | ICD-10-CM

## 2017-03-13 MED ORDER — GADOBENATE DIMEGLUMINE 529 MG/ML IV SOLN
17.0000 mL | Freq: Once | INTRAVENOUS | Status: AC | PRN
  Administered 2017-03-13: 17 mL via INTRAVENOUS

## 2017-03-28 ENCOUNTER — Ambulatory Visit: Payer: 59 | Admitting: Podiatry

## 2017-03-31 ENCOUNTER — Other Ambulatory Visit: Payer: Self-pay | Admitting: Family Medicine

## 2017-03-31 DIAGNOSIS — F3170 Bipolar disorder, currently in remission, most recent episode unspecified: Secondary | ICD-10-CM

## 2017-04-03 ENCOUNTER — Ambulatory Visit (INDEPENDENT_AMBULATORY_CARE_PROVIDER_SITE_OTHER): Payer: 59 | Admitting: Podiatry

## 2017-04-03 DIAGNOSIS — M722 Plantar fascial fibromatosis: Secondary | ICD-10-CM

## 2017-04-03 DIAGNOSIS — M216X9 Other acquired deformities of unspecified foot: Secondary | ICD-10-CM

## 2017-04-03 NOTE — Telephone Encounter (Signed)
Is this okay to refill? 

## 2017-04-03 NOTE — Progress Notes (Signed)
  Subjective:  Patient ID: Yolanda Weaver, female    DOB: 09-15-1970,  MRN: [id number]  Chief Complaint  Patient presents with  . Plantar Fasciitis    3 wk fu bl PF not able to get any relief shots did not help but has not been able to get any down time    47 y.o. female returns for the above complaint. Does not believe the shots helped much. Has been using the night splint which she states helps. Requests additional night splint today.  Objective:  There were no vitals filed for this visit. General AA&O x3. Normal mood and affect.  Vascular Pedal pulses palpable.  Neurologic Epicritic sensation grossly intact.  Dermatologic No open lesions. Skin normal texture and turgor.  Orthopedic: Pain to palpation medial calcaneal tuber bilat.   Assessment & Plan:  Patient was evaluated and treated and all questions answered.  Plantar Fasciitis -Repeat injections as below. -Additional night splint dispensed per patient request.  Procedure: Injection Tendon/Ligament Consent: Verbal consent obtained. Location: Bilateral plantar fascia at the glabrous junction; medial approach. Skin Prep: Alcohol. Injectate: 1 cc 0.5% marcaine plain, 1 cc dexamethasone phosphate, 0.5 cc kenalog 40. Disposition: Patient tolerated procedure well. Injection site dressed with a band-aid.  Return in about 3 weeks (around 04/24/2017) for Plantar Fasciitis.

## 2017-04-06 ENCOUNTER — Other Ambulatory Visit: Payer: Self-pay | Admitting: Podiatry

## 2017-04-06 DIAGNOSIS — M7661 Achilles tendinitis, right leg: Secondary | ICD-10-CM

## 2017-04-06 DIAGNOSIS — M722 Plantar fascial fibromatosis: Secondary | ICD-10-CM

## 2017-04-06 DIAGNOSIS — M216X1 Other acquired deformities of right foot: Secondary | ICD-10-CM

## 2017-04-06 DIAGNOSIS — M216X2 Other acquired deformities of left foot: Secondary | ICD-10-CM

## 2017-04-06 DIAGNOSIS — M7662 Achilles tendinitis, left leg: Secondary | ICD-10-CM

## 2017-04-06 DIAGNOSIS — M216X9 Other acquired deformities of unspecified foot: Secondary | ICD-10-CM

## 2017-04-24 ENCOUNTER — Ambulatory Visit (INDEPENDENT_AMBULATORY_CARE_PROVIDER_SITE_OTHER): Payer: 59 | Admitting: Podiatry

## 2017-04-24 DIAGNOSIS — M722 Plantar fascial fibromatosis: Secondary | ICD-10-CM

## 2017-04-24 DIAGNOSIS — M79672 Pain in left foot: Secondary | ICD-10-CM | POA: Diagnosis not present

## 2017-05-06 NOTE — Progress Notes (Signed)
  Subjective:  Patient ID: Yolanda Weaver, female    DOB: 08-Sep-1970,  MRN: [id number]  Chief Complaint  Patient presents with  . Plantar Fasciitis    fu PF Right is really bothering me Left is managable    47 y.o. female returns for the above complaint. States that she still having pain in the right foot, states in the left foot pain is manageable.  Objective:  There were no vitals filed for this visit. General AA&O x3. Normal mood and affect.  Vascular Pedal pulses palpable.  Neurologic Epicritic sensation grossly intact.  Dermatologic No open lesions. Skin normal texture and turgor.  Orthopedic: Pain to palpation medial calcaneal tuber right.  No pain palpation about the left medial calcaneal tuber   Assessment & Plan:  Patient was evaluated and treated and all questions answered.  Plantar Fasciitis -Repeat injection as below to the R foot. -Left foot improved no injection today  Procedure: Injection Tendon/Ligament Consent: Verbal consent obrained. Location: Right plantar fascia at the glabrous junction; medial approach. Skin Prep: Alcohol. Injectate: 1 cc 0.5% marcaine plain, 1 cc dexamethasone phosphate, 0.5 cc kenalog 10. Disposition: Patient tolerated procedure well. Injection site dressed with a band-aid.     Return in about 3 weeks (around 05/15/2017) for Plantar Fasciitis.  Right

## 2017-05-22 ENCOUNTER — Ambulatory Visit (INDEPENDENT_AMBULATORY_CARE_PROVIDER_SITE_OTHER): Payer: BLUE CROSS/BLUE SHIELD

## 2017-05-22 ENCOUNTER — Ambulatory Visit (INDEPENDENT_AMBULATORY_CARE_PROVIDER_SITE_OTHER): Payer: BLUE CROSS/BLUE SHIELD | Admitting: Podiatry

## 2017-05-22 DIAGNOSIS — M722 Plantar fascial fibromatosis: Secondary | ICD-10-CM

## 2017-05-22 DIAGNOSIS — S90112A Contusion of left great toe without damage to nail, initial encounter: Secondary | ICD-10-CM | POA: Diagnosis not present

## 2017-05-23 ENCOUNTER — Other Ambulatory Visit: Payer: Self-pay | Admitting: Podiatry

## 2017-05-23 DIAGNOSIS — M722 Plantar fascial fibromatosis: Secondary | ICD-10-CM

## 2017-05-23 DIAGNOSIS — M79672 Pain in left foot: Secondary | ICD-10-CM

## 2017-05-31 DIAGNOSIS — F4325 Adjustment disorder with mixed disturbance of emotions and conduct: Secondary | ICD-10-CM | POA: Diagnosis not present

## 2017-06-05 NOTE — Progress Notes (Signed)
  Subjective:  Patient ID: Yolanda Weaver, female    DOB: 1970-02-22,  MRN: [id number]  Chief Complaint  Patient presents with  . Plantar Fasciitis    F/U B/L fasciitis Pt. stated,"Lt foot is doing well. The Rt foot has not improved at all; 10/10 sharp pain.: Tx: Ibuprofen  . Foot Injury    L great toe joing injury (hit against the door) x 05/21/17; 6/10 shooting pain Tx: Ibuprofen   47 y.o. female returns for the above complaint.  States that the right foot has not improved at all.  Still having sharp pain.  Is been trying ibuprofen.  States that the left has been doing very well.  Reports left great toe injury from hitting it against the door 05/11/2017 reports 6/10 shooting pain. Has been using ibuprofen.   Objective:  There were no vitals filed for this visit. General AA&O x3. Normal mood and affect.  Vascular Pedal pulses palpable.  Neurologic Epicritic sensation grossly intact.  Dermatologic No open lesions. Skin normal texture and turgor.  Orthopedic: Pain to palpation medial calcaneal tuber right.  No pain palpation about the left medial calcaneal tuber Patient about the left great toe distal toe   Assessment & Plan:  Patient was evaluated and treated and all questions answered.  Plantar Fasciitis -Injection #3 R Foot  Procedure: Injection Tendon/Ligament Consent: Verbal consent obtained. Location: Right plantar fascia at the glabrous junction; medial approach. Skin Prep: Alcohol. Injectate: 1 cc 0.5% marcaine plain, 1 cc dexamethasone phosphate, 0.5 cc kenalog 10. Disposition: Patient tolerated procedure well. Injection site dressed with a band-aid.  Contusion Toe L - XR taken and reviewed no acute fracture dislocation  -Educated on buddy taping until pain ceases  Return in about 3 weeks (around 06/12/2017) for Plantar Fasciitis.  Right

## 2017-06-12 ENCOUNTER — Ambulatory Visit: Payer: BLUE CROSS/BLUE SHIELD | Admitting: Podiatry

## 2017-06-12 DIAGNOSIS — M2142 Flat foot [pes planus] (acquired), left foot: Secondary | ICD-10-CM

## 2017-06-12 DIAGNOSIS — M2141 Flat foot [pes planus] (acquired), right foot: Secondary | ICD-10-CM

## 2017-06-12 DIAGNOSIS — M722 Plantar fascial fibromatosis: Secondary | ICD-10-CM

## 2017-06-12 DIAGNOSIS — M7750 Other enthesopathy of unspecified foot: Secondary | ICD-10-CM

## 2017-06-12 DIAGNOSIS — M779 Enthesopathy, unspecified: Secondary | ICD-10-CM

## 2017-06-12 MED ORDER — METHYLPREDNISOLONE 4 MG PO TBPK: ORAL_TABLET | ORAL | 0 refills | Status: DC

## 2017-06-12 NOTE — Progress Notes (Signed)
  Subjective:  Patient ID: Yolanda Weaver, female    DOB: 1970-05-01,  MRN: [id number]  Chief Complaint  Patient presents with  . Plantar Fasciitis    F/U B/L plantar fasciitis Pt. stated," Rt bottom heel is still bothering a lot, but the Lt is much better." Tx: OTC heel gels   47 y.o. female returns for the above complaint.  States that the heel pain is doing a little better but she is still having 10 out of 10 pain in the right side.  Left side is not hurting her.  Interested in orthotics  Objective:  There were no vitals filed for this visit. General AA&O x3. Normal mood and affect.  Vascular Pedal pulses palpable.  Neurologic Epicritic sensation grossly intact.  Dermatologic No open lesions. Skin normal texture and turgor.  Orthopedic: POP medial calc tuber bilat, R>L   Assessment & Plan:  Patient was evaluated and treated and all questions answered.  Plantar Fasciitis -No repeat injection today. -Will fabricate CMOs for patient. -Rx Medrol pak for pain and inflammation.   No follow-ups on file.

## 2017-06-14 DIAGNOSIS — F4325 Adjustment disorder with mixed disturbance of emotions and conduct: Secondary | ICD-10-CM | POA: Diagnosis not present

## 2017-07-05 DIAGNOSIS — F4325 Adjustment disorder with mixed disturbance of emotions and conduct: Secondary | ICD-10-CM | POA: Diagnosis not present

## 2017-07-07 ENCOUNTER — Ambulatory Visit: Payer: BLUE CROSS/BLUE SHIELD | Admitting: Family Medicine

## 2017-07-07 VITALS — BP 138/88 | HR 89 | Temp 98.0°F | Ht 66.0 in | Wt 181.4 lb

## 2017-07-07 DIAGNOSIS — F3170 Bipolar disorder, currently in remission, most recent episode unspecified: Secondary | ICD-10-CM

## 2017-07-07 DIAGNOSIS — F439 Reaction to severe stress, unspecified: Secondary | ICD-10-CM | POA: Diagnosis not present

## 2017-07-07 DIAGNOSIS — E039 Hypothyroidism, unspecified: Secondary | ICD-10-CM

## 2017-07-07 DIAGNOSIS — R4702 Dysphasia: Secondary | ICD-10-CM

## 2017-07-07 NOTE — Progress Notes (Signed)
   Subjective:    Patient ID: Yolanda Weaver, female    DOB: [birth year], 47 y.o.   MRN: [id number]  HPI She is here for evaluation of multiple concerns.  She does need her thyroid blood work done again.  She does have a mood disorder and has been relatively stable on that however recently there has been a lot of turmoil in the house dealing with sharing the home with her brother-in-law.  She apparently has talked to her husband concerning the turmoil in the house revolving around the brother-in-law and his behavior.  He apparently has not been very helpful.  She is also been having difficulty with her son and anger management issues.  She is now getting help with this with [facility] and making progress there.  She also had her Mirena replaced.  She is wondering whether that is because difficulty with weight gain and hot flashes.  She also has had some difficulty with dysphasia and is wondering whether it is related to her thyroid surgery.   Review of Systems     Objective:   Physical Exam Alert and in no distress otherwise not examined       Assessment & Plan:  Bipolar affective disorder in remission (HCC) - Plan: Lamotrigine level  Dysphasia - Plan: DG Esophagus  Stress at home  Hypothyroidism, unspecified type - Plan: TSH  I will get a swallow study to make sure her throat is doing fine. Over 30 minutes spent discussing possible options concerning her present situation.  Again encouraged her to try and get her husband to go to counseling to help with the son and with the home situation.  Discussed various options including possibly her moving back to where they used to live apparently they do own a house as well.  Also discussed possible legal separation and getting a lawyer involved in this.  I strongly encouraged her to consider all options.  This also includes the children and whether he might possibly live. I do not think her mood disorder is causing any difficulty.

## 2017-07-09 LAB — TSH: TSH: 0.876 u[IU]/mL (ref 0.450–4.500)

## 2017-07-09 LAB — LAMOTRIGINE LEVEL: Lamotrigine Lvl: 5.6 ug/mL (ref 2.0–20.0)

## 2017-07-09 MED ORDER — LEVOTHYROXINE SODIUM 50 MCG PO TABS: 50.0000 ug | ORAL_TABLET | Freq: Every day | ORAL | 3 refills | Status: DC

## 2017-07-09 NOTE — Addendum Note (Signed)
Addended by: Sharlot GowdaLALONDE, Neyland Pettengill C on: 07/09/2017 09:00 PM   Modules accepted: Orders

## 2017-07-10 ENCOUNTER — Ambulatory Visit: Payer: BLUE CROSS/BLUE SHIELD | Admitting: Podiatry

## 2017-07-10 ENCOUNTER — Encounter: Payer: Self-pay | Admitting: Podiatry

## 2017-07-10 DIAGNOSIS — M722 Plantar fascial fibromatosis: Secondary | ICD-10-CM | POA: Diagnosis not present

## 2017-07-10 NOTE — Progress Notes (Signed)
  Subjective:  Patient ID: Tiburcio PeaLaurie T Belt, female    DOB: [birth year],  MRN: [id number]  Chief Complaint  Patient presents with  . Foot Orthotics    PUO today  . Plantar Fasciitis    B/ l feet are the same   47 y.o. female returns for the above complaint.  Reports that plantar fasciitis is the same not improved.   Objective:  There were no vitals filed for this visit. General AA&O x3. Normal mood and affect.  Vascular Pedal pulses palpable.  Neurologic Epicritic sensation grossly intact.  Dermatologic No open lesions. Skin normal texture and turgor.  Orthopedic:  Pain palpation medial calcaneal tuber bilateral.   Assessment & Plan:  Patient was evaluated and treated and all questions answered.  Plantar fasciitis -Orthotics dispensed.  Educated on proper break-in.  No injection today.  Discussed that should her pain persist would consider possible plantar fascial release.  Return in about 6 weeks (around 08/21/2017) for Plantar fasciitis.

## 2017-07-13 ENCOUNTER — Telehealth: Payer: Self-pay | Admitting: Family Medicine

## 2017-07-13 NOTE — Telephone Encounter (Signed)
Pt called regarding her swallowing study, if you could check on this please pt can be reached at [phone]

## 2017-07-13 NOTE — Telephone Encounter (Signed)
Pt is having the swalowling study done 07-18-17 at [facility] . Pt needs to arrive to their radiology dept by 9:15 and be fasting at least 3 hr before. No answer lvm of appt. KH

## 2017-07-18 ENCOUNTER — Ambulatory Visit (HOSPITAL_COMMUNITY): Payer: 59

## 2017-07-24 DIAGNOSIS — F4325 Adjustment disorder with mixed disturbance of emotions and conduct: Secondary | ICD-10-CM | POA: Diagnosis not present

## 2017-07-26 ENCOUNTER — Ambulatory Visit (HOSPITAL_COMMUNITY): Payer: Self-pay

## 2017-08-07 DIAGNOSIS — F4325 Adjustment disorder with mixed disturbance of emotions and conduct: Secondary | ICD-10-CM | POA: Diagnosis not present

## 2017-08-21 ENCOUNTER — Ambulatory Visit: Payer: BLUE CROSS/BLUE SHIELD | Admitting: Podiatry

## 2017-08-23 DIAGNOSIS — F4323 Adjustment disorder with mixed anxiety and depressed mood: Secondary | ICD-10-CM | POA: Diagnosis not present

## 2017-08-24 ENCOUNTER — Emergency Department (HOSPITAL_BASED_OUTPATIENT_CLINIC_OR_DEPARTMENT_OTHER): Payer: BLUE CROSS/BLUE SHIELD

## 2017-08-24 ENCOUNTER — Other Ambulatory Visit: Payer: Self-pay

## 2017-08-24 ENCOUNTER — Emergency Department (HOSPITAL_BASED_OUTPATIENT_CLINIC_OR_DEPARTMENT_OTHER)
Admission: EM | Admit: 2017-08-24 | Discharge: 2017-08-24 | Disposition: A | Discharge status: Home / Self Care | Payer: BLUE CROSS/BLUE SHIELD | Attending: Emergency Medicine | Admitting: Emergency Medicine

## 2017-08-24 ENCOUNTER — Encounter (HOSPITAL_BASED_OUTPATIENT_CLINIC_OR_DEPARTMENT_OTHER): Payer: Self-pay | Admitting: Emergency Medicine

## 2017-08-24 DIAGNOSIS — Z87891 Personal history of nicotine dependence: Secondary | ICD-10-CM | POA: Insufficient documentation

## 2017-08-24 DIAGNOSIS — Z981 Arthrodesis status: Secondary | ICD-10-CM | POA: Diagnosis not present

## 2017-08-24 DIAGNOSIS — E039 Hypothyroidism, unspecified: Secondary | ICD-10-CM | POA: Insufficient documentation

## 2017-08-24 DIAGNOSIS — M5441 Lumbago with sciatica, right side: Secondary | ICD-10-CM | POA: Insufficient documentation

## 2017-08-24 DIAGNOSIS — I10 Essential (primary) hypertension: Secondary | ICD-10-CM | POA: Diagnosis not present

## 2017-08-24 DIAGNOSIS — Z79899 Other long term (current) drug therapy: Secondary | ICD-10-CM | POA: Insufficient documentation

## 2017-08-24 DIAGNOSIS — R52 Pain, unspecified: Secondary | ICD-10-CM

## 2017-08-24 DIAGNOSIS — Z91040 Latex allergy status: Secondary | ICD-10-CM | POA: Diagnosis not present

## 2017-08-24 DIAGNOSIS — M545 Low back pain: Secondary | ICD-10-CM | POA: Diagnosis not present

## 2017-08-24 MED ORDER — PREDNISONE 10 MG PO TABS: 20.0000 mg | ORAL_TABLET | Freq: Two times a day (BID) | ORAL | 0 refills | Status: DC

## 2017-08-24 MED ORDER — METHYLPREDNISOLONE SODIUM SUCC 125 MG IJ SOLR
125.0000 mg | Freq: Once | INTRAMUSCULAR | Status: AC
  Administered 2017-08-24: 125 mg via INTRAMUSCULAR
  Filled 2017-08-24: qty 2

## 2017-08-24 MED FILL — predniSONE 10 MG TABS: 10 | 5 days supply | Qty: 20 | Fill #0

## 2017-08-24 NOTE — Discharge Instructions (Addendum)
Prednisone as prescribed.  Tizanidine as previously prescribed.  You may take 8 mg at a time up to 3 times daily, however you should not exceed 24 mg in a 24-hour period.  Follow-up with your neurosurgeon as scheduled on Tuesday.

## 2017-08-24 NOTE — ED Notes (Signed)
Patient transported to X-ray 

## 2017-08-24 NOTE — ED Provider Notes (Signed)
[facility] EMERGENCY DEPARTMENT Provider Note   CSN: [id number] Arrival date & time: 08/24/17  1424     History   Chief Complaint Chief Complaint  Patient presents with  . Back Pain    HPI Yolanda MARKERT is a 47 y.o. female.  Patient is a 47 year old female with history of Degenerative Disc Disease with lumbar fusion in the past by Dr. Yetta Barre.  Yolanda Weaver presents today with complaints of low back pain for the past 3 weeks and worsening.  Yolanda Weaver reports radiation of her pain into the right leg.  Yolanda Weaver reports subjective numbness, but no weakness or bowel or bladder complaint.s  No specific injury or trauma.  The history is provided by the patient.  Back Pain   This is a recurrent problem. The problem occurs constantly. The problem has been rapidly worsening. The pain is associated with no known injury. The pain is present in the lumbar spine. The quality of the pain is described as stabbing. The pain radiates to the right thigh. The pain is severe. The symptoms are aggravated by bending, certain positions and twisting. Pertinent negatives include no bowel incontinence, no perianal numbness, no bladder incontinence and no paresthesias. Yolanda Weaver has tried nothing for the symptoms.    Past Medical History:  Diagnosis Date  . Acne   . Allergy   . Anxiety   . Bipolar 1 disorder (HCC)   . Blood transfusion    h/o tx for rh incompatibility during pregnancy. see note re significant antibody  . Contraception   . Depression   . Hypertension   . Hypothyroid   . Mental disorder    bipolar  . Migraine   . Obesity   . PONV (postoperative nausea and vomiting)   . Ptosis, right eyelid   . Recurrent upper respiratory infection (URI)    reports Yolanda Weaver had bronchitis, treated /w antibiotics in past month    Patient Active Problem List   Diagnosis Date Noted  . S/P cervical spinal fusion 08/31/2016  . S/P lumbar spinal fusion 01/15/2015  . Hypothyroid 07/01/2010  . Migraine headache 07/01/2010    . Bipolar disorder (HCC) 07/01/2010  . Allergic rhinitis 07/01/2010    Past Surgical History:  Procedure Laterality Date  . DIAGNOSTIC LAPAROSCOPY     evaluation for endometriosis  . FOOT FOREIGN BODY REMOVAL    . KNEE ARTHROSCOPY     x3 r  . LUMBAR FUSION  01/15/2015  . LUMBAR LAMINECTOMY  04/08/2011   Procedure: MICRODISCECTOMY LUMBAR LAMINECTOMY;  Surgeon: Eldred Manges, MD;  Location: [facility] OR;  Service: Orthopedics;  Laterality: Right;  Right L5-S1 Microdiscectomy  . MAXIMUM ACCESS (MAS)POSTERIOR LUMBAR INTERBODY FUSION (PLIF) 1 LEVEL N/A 01/15/2015   Procedure: LUMBAR FIVE-SACRA L-ONE MAXIMUM ACCESS SURGERY POSTERIOR LUMBAR INTERBODY FUSION ;  Surgeon: Tia Alert, MD;  Location: [facility];  Service: Neurosurgery;  Laterality: N/A;  . THYROIDECTOMY, PARTIAL       OB History   None      Home Medications    Prior to Admission medications   Medication Sig Start Date End Date Taking? Authorizing Provider  hyoscyamine (LEVSIN/SL) 0.125 MG SL tablet Place 1 tablet (0.125 mg total) under the tongue every 4 (four) hours as needed for cramping. Patient not taking: Reported on 07/07/2017 07/09/13   Ronnald Nian, MD  ibuprofen (ADVIL,MOTRIN) 800 MG tablet TAKE 1 TABLET BY MOUTH EVERY 8 HOURS AS NEEDED WITH FOOD Patient taking differently: TAKE 1 TABLET BY MOUTH  EVERY 8 HOURS AS NEEDED WITH FOOD FOR PAIN 02/13/13   Delories HeinzEgerton, Kathryn P, DPM  lamoTRIgine (LAMICTAL) 200 MG tablet TAKE 1 AND 1/2 TABLETS BY  MOUTH AT BEDTIME 12/26/16   Ronnald NianLalonde, John C, MD  levonorgestrel (MIRENA, 52 MG,) 20 MCG/24HR IUD Mirena 20 mcg/24 hr (5 years) intrauterine device  Take by intrauterine route.    [provider]  levothyroxine (SYNTHROID, LEVOTHROID) 50 MCG tablet Take 1 tablet (50 mcg total) by mouth at bedtime. 07/09/17   Ronnald NianLalonde, John C, MD  meloxicam (MOBIC) 15 MG tablet Take 1 tablet (15 mg total) by mouth daily. Patient not taking: Reported on 07/07/2017 03/07/17   Park LiterPrice, Michael J, DPM   methylPREDNISolone (MEDROL DOSEPAK) 4 MG TBPK tablet 6 Day Taper Pack. Take as Directed. Patient not taking: Reported on 07/07/2017 06/12/17   Park LiterPrice, Michael J, DPM  phentermine 30 MG capsule TAKE 1 CAPSULE EVERY MORNING Patient not taking: Reported on 07/07/2017 09/27/16   Ronnald NianLalonde, John C, MD  tiZANidine (ZANAFLEX) 4 MG tablet Take 4 mg by mouth every 6 (six) hours as needed for muscle spasms.    [provider]    Family History Family History  Problem Relation Age of Onset  . COPD Father   . Cancer Other   . Hypertension Other   . Diabetes Other   . Anesthesia problems Neg Hx   . Hypotension Neg Hx   . Malignant hyperthermia Neg Hx   . Pseudochol deficiency Neg Hx     Social History Social History   Tobacco Use  . Smoking status: Former Smoker    Last attempt to quit: 02/07/2001    Years since quitting: 16.5  . Smokeless tobacco: Never Used  Substance Use Topics  . Alcohol use: Yes    Alcohol/week: 0.6 oz    Types: 1 Glasses of wine per week    Comment: couple times a week  . Drug use: No     Allergies   Latex; Acetaminophen; Dilaudid [hydromorphone hcl]; Erythromycin; Hydrocodone; and Oxycodone   Review of Systems Review of Systems  Gastrointestinal: Negative for bowel incontinence.  Genitourinary: Negative for bladder incontinence.  Musculoskeletal: Positive for back pain.  Neurological: Negative for paresthesias.  All other systems reviewed and are negative.    Physical Exam Updated Vital Signs BP (!) 167/95 (BP Location: Left Arm)   Pulse 88   Temp 98.7 F (37.1 C) (Oral)   Resp 18   Ht 5\' 6"  (1.676 m)   Wt 83.9 kg (185 lb)   SpO2 99%   BMI 29.86 kg/m   Physical Exam  Constitutional: Yolanda Weaver is oriented to person, place, and time. Yolanda Weaver appears well-developed and well-nourished. No distress.  HENT:  Head: Normocephalic and atraumatic.  Neck: Normal range of motion. Neck supple.  Pulmonary/Chest: Effort normal.  Musculoskeletal:  There is ttp  in the soft tissues of the lumbar spine.    Neurological: Yolanda Weaver is alert and oriented to person, place, and time.  DTR's are 2+ in the patellar and Achilles tendons bilaterally.  Strength is 5/5 in both lower extremities.  Yolanda Weaver is able to walk but with an antalgic gait.  Skin: Skin is warm and dry. Yolanda Weaver is not diaphoretic.  Nursing note and vitals reviewed.    ED Treatments / Results  Labs (all labs ordered are listed, but only abnormal results are displayed) Labs Reviewed - No data to display  EKG None  Radiology No results found.  Procedures Procedures (including critical care time)  Medications Ordered in ED Medications  methylPREDNISolone sodium succinate (SOLU-MEDROL) 125 mg/2 mL injection 125 mg (has no administration in time range)     Initial Impression / Assessment and Plan / ED Course  I have reviewed the triage vital signs and the nursing notes.  Pertinent labs & imaging results that were available during my care of the patient were reviewed by me and considered in my medical decision making (see chart for details).  Patient with history of prior lumbar fusion presenting with back pain in the absence of any injury or trauma.  Her neurologic examination is nonfocal and Yolanda Weaver has no bowel or bladder complaints.  I see no findings that are suggestive of an emergent situation.  Yolanda Weaver did have x-rays of the lumbar spine that show adequate placement without breakage or slippage of the hardware.  Yolanda Weaver was given IM Solu-Medrol and will be discharged with prednisone.  Yolanda Weaver has tizanidine at home that Yolanda Weaver takes.  Yolanda Weaver is refusing other medications as Yolanda Weaver has multiple allergies.  Yolanda Weaver has an appointment for this upcoming Tuesday with her neurosurgeon and I have advised her to keep this appointment.  Final Clinical Impressions(s) / ED Diagnoses   Final diagnoses:  None    ED Discharge Orders    None       Geoffery Lyons, MD 08/24/17 [phone]

## 2017-08-24 NOTE — ED Triage Notes (Signed)
Low back pain for 3 weeks. Starting hurting after wearing orthotics for plantar fasciitis. Hx of back problems.

## 2017-08-28 ENCOUNTER — Ambulatory Visit: Payer: BLUE CROSS/BLUE SHIELD | Admitting: Podiatry

## 2017-08-28 DIAGNOSIS — M722 Plantar fascial fibromatosis: Secondary | ICD-10-CM | POA: Diagnosis not present

## 2017-08-28 NOTE — Progress Notes (Signed)
  Subjective:  Patient ID: Yolanda Weaver, female    DOB: 1970/08/17,  MRN: [id number]  Chief Complaint  Patient presents with  . Plantar Fasciitis    F/U Bl PF pt. stated," I have screws in my back and the orthotics made my back pain worse. Also, I've been having numbness and tingling."   47 y.o. female returns for the above complaint.  States the orthotics are making her back hurt.  Having numbness and tingling in her legs from low back issues.  Objective:  There were no vitals filed for this visit. General AA&O x3. Normal mood and affect.  Vascular Pedal pulses palpable.  Neurologic Epicritic sensation grossly intact.  Dermatologic No open lesions. Skin normal texture and turgor.  Orthopedic: Pain palpation medial calcaneal tuber right.   Assessment & Plan:  Patient was evaluated and treated and all questions answered.  Plantar fasciitis -Hold off orthotic use due to low back pain -Refer to PT -Discussed possible shockwave in the future as other noninvasive option  Return in about 6 weeks (around 10/09/2017) for Plantar fasciitis, Right.

## 2017-09-06 DIAGNOSIS — Z1231 Encounter for screening mammogram for malignant neoplasm of breast: Secondary | ICD-10-CM | POA: Diagnosis not present

## 2017-09-06 DIAGNOSIS — Z124 Encounter for screening for malignant neoplasm of cervix: Secondary | ICD-10-CM | POA: Diagnosis not present

## 2017-09-06 DIAGNOSIS — Z01419 Encounter for gynecological examination (general) (routine) without abnormal findings: Secondary | ICD-10-CM | POA: Diagnosis not present

## 2017-09-06 DIAGNOSIS — Z6829 Body mass index (BMI) 29.0-29.9, adult: Secondary | ICD-10-CM | POA: Diagnosis not present

## 2017-09-06 LAB — HM MAMMOGRAPHY

## 2017-09-06 LAB — HM PAP SMEAR

## 2017-09-13 LAB — RESULTS CONSOLE HPV: CHL HPV: NEGATIVE

## 2017-09-13 LAB — HM PAP SMEAR

## 2017-09-26 DIAGNOSIS — R030 Elevated blood-pressure reading, without diagnosis of hypertension: Secondary | ICD-10-CM | POA: Diagnosis not present

## 2017-09-26 DIAGNOSIS — M545 Low back pain: Secondary | ICD-10-CM | POA: Diagnosis not present

## 2017-09-26 DIAGNOSIS — Z6829 Body mass index (BMI) 29.0-29.9, adult: Secondary | ICD-10-CM | POA: Diagnosis not present

## 2017-10-03 DIAGNOSIS — M545 Low back pain: Secondary | ICD-10-CM | POA: Diagnosis not present

## 2017-10-10 ENCOUNTER — Ambulatory Visit: Payer: BLUE CROSS/BLUE SHIELD | Admitting: Podiatry

## 2017-10-12 DIAGNOSIS — M545 Low back pain: Secondary | ICD-10-CM | POA: Diagnosis not present

## 2017-10-26 DIAGNOSIS — M545 Low back pain: Secondary | ICD-10-CM | POA: Diagnosis not present

## 2017-11-03 DIAGNOSIS — Z6830 Body mass index (BMI) 30.0-30.9, adult: Secondary | ICD-10-CM | POA: Diagnosis not present

## 2017-11-03 DIAGNOSIS — N958 Other specified menopausal and perimenopausal disorders: Secondary | ICD-10-CM | POA: Diagnosis not present

## 2017-11-13 DIAGNOSIS — M545 Low back pain: Secondary | ICD-10-CM | POA: Diagnosis not present

## 2017-11-14 ENCOUNTER — Other Ambulatory Visit (INDEPENDENT_AMBULATORY_CARE_PROVIDER_SITE_OTHER): Payer: BLUE CROSS/BLUE SHIELD

## 2017-11-14 DIAGNOSIS — Z23 Encounter for immunization: Secondary | ICD-10-CM

## 2017-11-23 DIAGNOSIS — M545 Low back pain: Secondary | ICD-10-CM | POA: Diagnosis not present

## 2017-11-23 DIAGNOSIS — M542 Cervicalgia: Secondary | ICD-10-CM | POA: Diagnosis not present

## 2017-11-24 ENCOUNTER — Other Ambulatory Visit: Payer: Self-pay | Admitting: Family Medicine

## 2017-11-24 DIAGNOSIS — E039 Hypothyroidism, unspecified: Secondary | ICD-10-CM

## 2017-11-28 DIAGNOSIS — M542 Cervicalgia: Secondary | ICD-10-CM | POA: Diagnosis not present

## 2017-12-28 ENCOUNTER — Telehealth: Payer: Self-pay | Admitting: Family Medicine

## 2017-12-28 NOTE — Telephone Encounter (Signed)
Yolanda ConesLaurie called and wants you to call her and tell her if there is a way to test her children for asbestos and mold exposure.  She states their ceiling is asbestos and had water damage.  [phone]

## 2018-01-15 DIAGNOSIS — F310 Bipolar disorder, current episode hypomanic: Secondary | ICD-10-CM | POA: Diagnosis not present

## 2018-01-24 DIAGNOSIS — F310 Bipolar disorder, current episode hypomanic: Secondary | ICD-10-CM | POA: Diagnosis not present

## 2018-02-01 DIAGNOSIS — F310 Bipolar disorder, current episode hypomanic: Secondary | ICD-10-CM | POA: Diagnosis not present

## 2018-02-13 DIAGNOSIS — F411 Generalized anxiety disorder: Secondary | ICD-10-CM | POA: Diagnosis not present

## 2018-02-21 DIAGNOSIS — F411 Generalized anxiety disorder: Secondary | ICD-10-CM | POA: Diagnosis not present

## 2018-02-23 ENCOUNTER — Other Ambulatory Visit: Payer: Self-pay | Admitting: Family Medicine

## 2018-02-23 DIAGNOSIS — F3170 Bipolar disorder, currently in remission, most recent episode unspecified: Secondary | ICD-10-CM

## 2018-02-23 NOTE — Telephone Encounter (Signed)
Is this okay to refill? Last seen on 06/2017 for this

## 2018-03-02 DIAGNOSIS — F411 Generalized anxiety disorder: Secondary | ICD-10-CM | POA: Diagnosis not present

## 2018-03-07 DIAGNOSIS — M5412 Radiculopathy, cervical region: Secondary | ICD-10-CM | POA: Diagnosis not present

## 2018-03-07 DIAGNOSIS — M961 Postlaminectomy syndrome, not elsewhere classified: Secondary | ICD-10-CM | POA: Diagnosis not present

## 2018-03-16 DIAGNOSIS — F411 Generalized anxiety disorder: Secondary | ICD-10-CM | POA: Diagnosis not present

## 2018-03-17 DIAGNOSIS — F411 Generalized anxiety disorder: Secondary | ICD-10-CM | POA: Diagnosis not present

## 2018-03-20 DIAGNOSIS — R1031 Right lower quadrant pain: Secondary | ICD-10-CM | POA: Diagnosis not present

## 2018-03-22 DIAGNOSIS — F411 Generalized anxiety disorder: Secondary | ICD-10-CM | POA: Diagnosis not present

## 2018-03-28 DIAGNOSIS — Z6829 Body mass index (BMI) 29.0-29.9, adult: Secondary | ICD-10-CM | POA: Diagnosis not present

## 2018-03-28 DIAGNOSIS — Z30431 Encounter for routine checking of intrauterine contraceptive device: Secondary | ICD-10-CM | POA: Diagnosis not present

## 2018-04-02 DIAGNOSIS — M5412 Radiculopathy, cervical region: Secondary | ICD-10-CM | POA: Diagnosis not present

## 2018-04-02 DIAGNOSIS — M961 Postlaminectomy syndrome, not elsewhere classified: Secondary | ICD-10-CM | POA: Diagnosis not present

## 2018-04-06 DIAGNOSIS — F411 Generalized anxiety disorder: Secondary | ICD-10-CM | POA: Diagnosis not present

## 2018-04-10 DIAGNOSIS — F411 Generalized anxiety disorder: Secondary | ICD-10-CM | POA: Diagnosis not present

## 2018-04-17 DIAGNOSIS — F411 Generalized anxiety disorder: Secondary | ICD-10-CM | POA: Diagnosis not present

## 2018-04-24 DIAGNOSIS — F411 Generalized anxiety disorder: Secondary | ICD-10-CM | POA: Diagnosis not present

## 2018-05-11 DIAGNOSIS — F411 Generalized anxiety disorder: Secondary | ICD-10-CM | POA: Diagnosis not present

## 2018-05-16 DIAGNOSIS — F411 Generalized anxiety disorder: Secondary | ICD-10-CM | POA: Diagnosis not present

## 2018-05-18 DIAGNOSIS — F411 Generalized anxiety disorder: Secondary | ICD-10-CM | POA: Diagnosis not present

## 2018-05-22 DIAGNOSIS — F411 Generalized anxiety disorder: Secondary | ICD-10-CM | POA: Diagnosis not present

## 2018-05-25 ENCOUNTER — Encounter: Payer: Self-pay | Admitting: Medical

## 2018-05-25 ENCOUNTER — Ambulatory Visit (INDEPENDENT_AMBULATORY_CARE_PROVIDER_SITE_OTHER): Payer: BLUE CROSS/BLUE SHIELD | Admitting: Medical

## 2018-05-25 ENCOUNTER — Other Ambulatory Visit: Payer: Self-pay

## 2018-05-25 VITALS — Temp 97.1°F | Wt 185.0 lb

## 2018-05-25 DIAGNOSIS — J329 Chronic sinusitis, unspecified: Secondary | ICD-10-CM | POA: Diagnosis not present

## 2018-05-25 DIAGNOSIS — J40 Bronchitis, not specified as acute or chronic: Secondary | ICD-10-CM

## 2018-05-25 MED ORDER — AMOXICILLIN-POT CLAVULANATE 875-125 MG PO TABS: 1.0000 | ORAL_TABLET | Freq: Two times a day (BID) | ORAL | 0 refills | Status: DC

## 2018-05-25 MED ORDER — ALBUTEROL SULFATE HFA 108 (90 BASE) MCG/ACT IN AERS: 2.0000 | INHALATION_SPRAY | Freq: Four times a day (QID) | RESPIRATORY_TRACT | 0 refills | Status: DC | PRN

## 2018-05-25 NOTE — Progress Notes (Signed)
Subjective:     Patient ID: Yolanda Weaver, female   DOB: Dec 12, 1970, 48 y.o.   MRN: [id number]  This visit type was conducted due to national recommendations for restrictions regarding the COVID-19 Pandemic (e.g. social distancing) in an effort to limit this patient's exposure and mitigate transmission in our community.  Due to their co-morbid illnesses, this patient is at least at moderate risk for complications without adequate follow up.  This format is felt to be most appropriate for this patient at this time.    Documentation for virtual audio and video telecommunications through Zoom encounter:  The patient was located at home. The provider was located in the office. The patient did consent to this visit and is aware of possible charges through their insurance for this visit.  The other persons participating in this telemedicine service were none. Time spent on call was 15 minutes and in review of previous records >25 minutes total.  This virtual service is not related to other E/M service within previous 7 days.   HPI Chief Complaint  Patient presents with  . sinus infection    sinus infection/ bronchitis- symptoms- green mucous and red mucous, pressure, congestion, headaches. cough. been going on about a week and half.    She notes sinus pressure, thinks she has sinutisi and bronchitis, gets this 2-3 times per year.   She notes 1.5 week hx/o sinus pressure, congestion in head and chest, colored mucous, headache, cough.   No fever, no body aches, no chills, no NVD, no belly pain.   Has chronic back pain, not new.    No wheezing, but some dyspnea.  No recent travel.   No known contact with covid 19.   Nonsmoker.  Quit smoking 2003.   Using zyrtec D.  Having flare up with pollen as well.  No other aggravating or relieving factors. No other complaint.  Past Medical History:  Diagnosis Date  . Acne   . Allergy   . Anxiety   . Bipolar 1 disorder (HCC)   . Blood transfusion    h/o  tx for rh incompatibility during pregnancy. see note re significant antibody  . Contraception   . Depression   . Hypertension   . Hypothyroid   . Mental disorder    bipolar  . Migraine   . Obesity   . PONV (postoperative nausea and vomiting)   . Ptosis, right eyelid   . Recurrent upper respiratory infection (URI)    reports she had bronchitis, treated /w antibiotics in past month    Review of Systems As in subjective    Objective:   Physical Exam  Temp (!) 97.1 F (36.2 C)   Wt 185 lb (83.9 kg)   BMI 29.86 kg/m   Due to coronavirus pandemic stay at home measures, patient visit was virtual and they were not examined in person.   Gen: nad, not dyspneic on phone, not much cough while on phone     Assessment:     Encounter Diagnosis  Name Primary?  . Sinobronchitis Yes       Plan:     We discussed her symptoms and concerns.  We discussed limitations of virtual visit.  She has a pattern of sinobronchitis in the past she says she gets 3 times a year.  But she feels like this is her usual pattern so we will begin medication as below which is worked for her in the past.  She is a former smoker.  Advise rest,  hydration, can continue the over-the-counter remedies she is using and if not much improved within the next 4 to 5 days call back.  Discussed self quarantine in the event this could be related to coated but I doubt it based on her current symptoms and pattern.  I told her there was no way to rule out COVID right now since we do not have a rapid test available here in the community, so for that reason the self quarantine until at least 7 days ago by along with 72-hour symptom-free period.   F/u prn.  Yolanda Weaver was seen today for sinus infection.  Diagnoses and all orders for this visit:  Sinobronchitis  Other orders -     amoxicillin-clavulanate (AUGMENTIN) 875-125 MG tablet; Take 1 tablet by mouth 2 (two) times daily. -     albuterol (VENTOLIN HFA) 108 (90 Base) MCG/ACT  inhaler; Inhale 2 puffs into the lungs every 6 (six) hours as needed for wheezing or shortness of breath.

## 2018-05-30 DIAGNOSIS — F411 Generalized anxiety disorder: Secondary | ICD-10-CM | POA: Diagnosis not present

## 2018-06-14 DIAGNOSIS — M5416 Radiculopathy, lumbar region: Secondary | ICD-10-CM | POA: Diagnosis not present

## 2018-06-14 DIAGNOSIS — M961 Postlaminectomy syndrome, not elsewhere classified: Secondary | ICD-10-CM | POA: Diagnosis not present

## 2018-06-19 ENCOUNTER — Other Ambulatory Visit: Payer: Self-pay | Admitting: Medical

## 2018-06-19 NOTE — Telephone Encounter (Signed)
Is this ok to refill?  

## 2018-06-27 DIAGNOSIS — F411 Generalized anxiety disorder: Secondary | ICD-10-CM | POA: Diagnosis not present

## 2018-07-06 DIAGNOSIS — F411 Generalized anxiety disorder: Secondary | ICD-10-CM | POA: Diagnosis not present

## 2018-07-12 DIAGNOSIS — F411 Generalized anxiety disorder: Secondary | ICD-10-CM | POA: Diagnosis not present

## 2018-07-19 DIAGNOSIS — M961 Postlaminectomy syndrome, not elsewhere classified: Secondary | ICD-10-CM | POA: Diagnosis not present

## 2018-07-23 ENCOUNTER — Encounter: Payer: Self-pay | Admitting: Family Medicine

## 2018-07-25 DIAGNOSIS — Z20828 Contact with and (suspected) exposure to other viral communicable diseases: Secondary | ICD-10-CM | POA: Diagnosis not present

## 2018-07-25 DIAGNOSIS — F411 Generalized anxiety disorder: Secondary | ICD-10-CM | POA: Diagnosis not present

## 2018-07-31 ENCOUNTER — Other Ambulatory Visit: Payer: Self-pay

## 2018-07-31 DIAGNOSIS — F3170 Bipolar disorder, currently in remission, most recent episode unspecified: Secondary | ICD-10-CM

## 2018-07-31 MED ORDER — LAMOTRIGINE 200 MG PO TABS: ORAL_TABLET | ORAL | 1 refills | Status: DC

## 2018-07-31 NOTE — Telephone Encounter (Signed)
[facility] called and stated that patient has requested a refill on Lamictal. Please advise.

## 2018-08-21 DIAGNOSIS — D3131 Benign neoplasm of right choroid: Secondary | ICD-10-CM | POA: Diagnosis not present

## 2018-08-23 DIAGNOSIS — M542 Cervicalgia: Secondary | ICD-10-CM | POA: Diagnosis not present

## 2018-08-23 DIAGNOSIS — M961 Postlaminectomy syndrome, not elsewhere classified: Secondary | ICD-10-CM | POA: Diagnosis not present

## 2018-08-31 DIAGNOSIS — F411 Generalized anxiety disorder: Secondary | ICD-10-CM | POA: Diagnosis not present

## 2018-09-06 DIAGNOSIS — F411 Generalized anxiety disorder: Secondary | ICD-10-CM | POA: Diagnosis not present

## 2018-09-07 DIAGNOSIS — F411 Generalized anxiety disorder: Secondary | ICD-10-CM | POA: Diagnosis not present

## 2018-09-07 DIAGNOSIS — M545 Low back pain: Secondary | ICD-10-CM | POA: Diagnosis not present

## 2018-09-07 DIAGNOSIS — M542 Cervicalgia: Secondary | ICD-10-CM | POA: Diagnosis not present

## 2018-09-07 DIAGNOSIS — S161XXA Strain of muscle, fascia and tendon at neck level, initial encounter: Secondary | ICD-10-CM | POA: Diagnosis not present

## 2018-09-10 ENCOUNTER — Ambulatory Visit (INDEPENDENT_AMBULATORY_CARE_PROVIDER_SITE_OTHER): Payer: BC Managed Care – PPO | Admitting: Family Medicine

## 2018-09-10 ENCOUNTER — Other Ambulatory Visit: Payer: Self-pay

## 2018-09-10 ENCOUNTER — Telehealth: Payer: Self-pay | Admitting: Family Medicine

## 2018-09-10 ENCOUNTER — Encounter: Payer: Self-pay | Admitting: Family Medicine

## 2018-09-10 VITALS — BP 136/88 | HR 88 | Temp 98.0°F | Wt 186.0 lb

## 2018-09-10 DIAGNOSIS — Z79899 Other long term (current) drug therapy: Secondary | ICD-10-CM | POA: Diagnosis not present

## 2018-09-10 DIAGNOSIS — Z981 Arthrodesis status: Secondary | ICD-10-CM

## 2018-09-10 DIAGNOSIS — E039 Hypothyroidism, unspecified: Secondary | ICD-10-CM | POA: Diagnosis not present

## 2018-09-10 DIAGNOSIS — F439 Reaction to severe stress, unspecified: Secondary | ICD-10-CM | POA: Diagnosis not present

## 2018-09-10 DIAGNOSIS — E6609 Other obesity due to excess calories: Secondary | ICD-10-CM

## 2018-09-10 DIAGNOSIS — F3170 Bipolar disorder, currently in remission, most recent episode unspecified: Secondary | ICD-10-CM | POA: Diagnosis not present

## 2018-09-10 MED ORDER — LAMOTRIGINE 200 MG PO TABS: ORAL_TABLET | ORAL | 1 refills | Status: DC

## 2018-09-10 MED ORDER — LEVOTHYROXINE SODIUM 50 MCG PO TABS: 50.0000 ug | ORAL_TABLET | Freq: Every day | ORAL | 3 refills | Status: DC

## 2018-09-10 NOTE — Telephone Encounter (Signed)
Advised pt that Rx sent to mail order, 1 box Synthroid samples given to hold pt til receives mail order, ok per Dr. Redmond School
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
## 2018-09-11 ENCOUNTER — Telehealth: Payer: Self-pay

## 2018-09-11 LAB — COMPREHENSIVE METABOLIC PANEL
ALT: 8 IU/L (ref 0–32)
AST: 16 IU/L (ref 0–40)
Albumin/Globulin Ratio: 2 (ref 1.2–2.2)
Albumin: 4.6 g/dL (ref 3.8–4.8)
Alkaline Phosphatase: 95 IU/L (ref 39–117)
BUN/Creatinine Ratio: 17 (ref 9–23)
BUN: 13 mg/dL (ref 6–24)
Bilirubin Total: 0.3 mg/dL (ref 0.0–1.2)
CO2: 24 mmol/L (ref 20–29)
Calcium: 9.6 mg/dL (ref 8.7–10.2)
Chloride: 102 mmol/L (ref 96–106)
Creatinine, Ser: 0.76 mg/dL (ref 0.57–1.00)
GFR calc Af Amer: 107 mL/min/{1.73_m2} (ref >59)
GFR calc non Af Amer: 93 mL/min/{1.73_m2} (ref >59)
Globulin, Total: 2.3 g/dL (ref 1.5–4.5)
Glucose: 99 mg/dL (ref 65–99)
Potassium: 4 mmol/L (ref 3.5–5.2)
Sodium: 142 mmol/L (ref 134–144)
Total Protein: 6.9 g/dL (ref 6.0–8.5)

## 2018-09-11 LAB — CBC WITH DIFFERENTIAL/PLATELET
Basophils Absolute: 0 10*3/uL (ref 0.0–0.2)
Basos: 0 %
EOS (ABSOLUTE): 0.1 10*3/uL (ref 0.0–0.4)
Eos: 0 %
Hematocrit: 41.8 % (ref 34.0–46.6)
Hemoglobin: 14.2 g/dL (ref 11.1–15.9)
Immature Grans (Abs): 0.1 10*3/uL (ref 0.0–0.1)
Immature Granulocytes: 1 %
Lymphocytes Absolute: 0.9 10*3/uL (ref 0.7–3.1)
Lymphs: 8 %
MCH: 32.3 pg (ref 26.6–33.0)
MCHC: 34 g/dL (ref 31.5–35.7)
MCV: 95 fL (ref 79–97)
Monocytes Absolute: 0.5 10*3/uL (ref 0.1–0.9)
Monocytes: 4 %
Neutrophils Absolute: 10.4 10*3/uL — ABNORMAL HIGH (ref 1.4–7.0)
Neutrophils: 87 %
Platelets: 327 10*3/uL (ref 150–450)
RBC: 4.4 x10E6/uL (ref 3.77–5.28)
RDW: 11.9 % (ref 11.7–15.4)
WBC: 12 10*3/uL — ABNORMAL HIGH (ref 3.4–10.8)

## 2018-09-11 LAB — LIPID PANEL
Chol/HDL Ratio: 3.3 ratio (ref 0.0–4.4)
Cholesterol, Total: 171 mg/dL (ref 100–199)
HDL: 52 mg/dL (ref >39)
LDL Calculated: 100 mg/dL — ABNORMAL HIGH (ref 0–99)
Triglycerides: 93 mg/dL (ref 0–149)
VLDL Cholesterol Cal: 19 mg/dL (ref 5–40)

## 2018-09-11 LAB — LAMOTRIGINE LEVEL: Lamotrigine Lvl: 5.5 ug/mL (ref 2.0–20.0)

## 2018-09-11 LAB — TSH: TSH: 1.04 u[IU]/mL (ref 0.450–4.500)

## 2018-09-11 NOTE — Telephone Encounter (Signed)
Pt was advised labs . Per [employer] look ok. [place]

## 2018-09-14 ENCOUNTER — Telehealth: Payer: Self-pay | Admitting: Family Medicine

## 2018-09-14 DIAGNOSIS — E039 Hypothyroidism, unspecified: Secondary | ICD-10-CM

## 2018-09-14 DIAGNOSIS — F411 Generalized anxiety disorder: Secondary | ICD-10-CM | POA: Diagnosis not present

## 2018-09-14 DIAGNOSIS — F3170 Bipolar disorder, currently in remission, most recent episode unspecified: Secondary | ICD-10-CM

## 2018-09-14 MED ORDER — LEVOTHYROXINE SODIUM 50 MCG PO TABS: 50.0000 ug | ORAL_TABLET | Freq: Every day | ORAL | 3 refills | Status: DC

## 2018-09-14 MED ORDER — LAMOTRIGINE 200 MG PO TABS: ORAL_TABLET | ORAL | 1 refills | Status: DC

## 2018-09-14 NOTE — Telephone Encounter (Signed)
Pt called and states she went to have a massage and it did not help at all. She is requesting prednisone be sent in for her to help with pain. Pt can be reached at [phone]. [facility] [place].
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
## 2018-09-14 NOTE — Telephone Encounter (Signed)
LVM for pt . KH 

## 2018-09-17 ENCOUNTER — Telehealth: Payer: Self-pay | Admitting: Family Medicine

## 2018-09-17 DIAGNOSIS — F411 Generalized anxiety disorder: Secondary | ICD-10-CM | POA: Diagnosis not present

## 2018-09-24 DIAGNOSIS — F411 Generalized anxiety disorder: Secondary | ICD-10-CM | POA: Diagnosis not present

## 2018-09-26 ENCOUNTER — Encounter: Payer: Self-pay | Admitting: Family Medicine

## 2018-10-01 ENCOUNTER — Encounter: Payer: Self-pay | Admitting: Family Medicine

## 2018-10-01 DIAGNOSIS — F411 Generalized anxiety disorder: Secondary | ICD-10-CM | POA: Diagnosis not present

## 2018-10-23 DIAGNOSIS — F411 Generalized anxiety disorder: Secondary | ICD-10-CM | POA: Diagnosis not present

## 2018-10-24 ENCOUNTER — Encounter: Payer: Self-pay | Admitting: Family Medicine

## 2018-10-29 DIAGNOSIS — F411 Generalized anxiety disorder: Secondary | ICD-10-CM | POA: Diagnosis not present

## 2018-11-05 DIAGNOSIS — F411 Generalized anxiety disorder: Secondary | ICD-10-CM | POA: Diagnosis not present

## 2018-11-12 DIAGNOSIS — F411 Generalized anxiety disorder: Secondary | ICD-10-CM | POA: Diagnosis not present

## 2018-11-19 DIAGNOSIS — M5416 Radiculopathy, lumbar region: Secondary | ICD-10-CM | POA: Diagnosis not present

## 2018-11-19 DIAGNOSIS — M542 Cervicalgia: Secondary | ICD-10-CM | POA: Diagnosis not present

## 2018-11-19 DIAGNOSIS — M961 Postlaminectomy syndrome, not elsewhere classified: Secondary | ICD-10-CM | POA: Diagnosis not present

## 2018-11-19 DIAGNOSIS — I10 Essential (primary) hypertension: Secondary | ICD-10-CM | POA: Diagnosis not present

## 2018-11-26 DIAGNOSIS — F411 Generalized anxiety disorder: Secondary | ICD-10-CM | POA: Diagnosis not present

## 2018-11-29 DIAGNOSIS — M542 Cervicalgia: Secondary | ICD-10-CM | POA: Diagnosis not present

## 2018-12-04 DIAGNOSIS — M5126 Other intervertebral disc displacement, lumbar region: Secondary | ICD-10-CM | POA: Diagnosis not present

## 2018-12-04 DIAGNOSIS — M47816 Spondylosis without myelopathy or radiculopathy, lumbar region: Secondary | ICD-10-CM | POA: Diagnosis not present

## 2018-12-04 DIAGNOSIS — M4802 Spinal stenosis, cervical region: Secondary | ICD-10-CM | POA: Diagnosis not present

## 2018-12-04 DIAGNOSIS — M47812 Spondylosis without myelopathy or radiculopathy, cervical region: Secondary | ICD-10-CM | POA: Diagnosis not present

## 2018-12-04 DIAGNOSIS — M545 Low back pain: Secondary | ICD-10-CM | POA: Diagnosis not present

## 2018-12-04 DIAGNOSIS — M542 Cervicalgia: Secondary | ICD-10-CM | POA: Diagnosis not present

## 2018-12-11 DIAGNOSIS — M542 Cervicalgia: Secondary | ICD-10-CM | POA: Diagnosis not present

## 2018-12-13 DIAGNOSIS — R531 Weakness: Secondary | ICD-10-CM | POA: Diagnosis not present

## 2018-12-13 DIAGNOSIS — M2560 Stiffness of unspecified joint, not elsewhere classified: Secondary | ICD-10-CM | POA: Diagnosis not present

## 2018-12-13 DIAGNOSIS — M62830 Muscle spasm of back: Secondary | ICD-10-CM | POA: Diagnosis not present

## 2018-12-13 DIAGNOSIS — M542 Cervicalgia: Secondary | ICD-10-CM | POA: Diagnosis not present

## 2018-12-14 DIAGNOSIS — F411 Generalized anxiety disorder: Secondary | ICD-10-CM | POA: Diagnosis not present

## 2018-12-19 DIAGNOSIS — R531 Weakness: Secondary | ICD-10-CM | POA: Diagnosis not present

## 2018-12-19 DIAGNOSIS — M62830 Muscle spasm of back: Secondary | ICD-10-CM | POA: Diagnosis not present

## 2018-12-19 DIAGNOSIS — M2560 Stiffness of unspecified joint, not elsewhere classified: Secondary | ICD-10-CM | POA: Diagnosis not present

## 2018-12-19 DIAGNOSIS — M542 Cervicalgia: Secondary | ICD-10-CM | POA: Diagnosis not present

## 2018-12-23 ENCOUNTER — Emergency Department (INDEPENDENT_AMBULATORY_CARE_PROVIDER_SITE_OTHER): Payer: BC Managed Care – PPO

## 2018-12-23 ENCOUNTER — Emergency Department
Admission: EM | Admit: 2018-12-23 | Discharge: 2018-12-23 | Disposition: A | Discharge status: Home / Self Care | Payer: BC Managed Care – PPO | Source: Home / Self Care

## 2018-12-23 ENCOUNTER — Encounter: Payer: Self-pay | Admitting: Emergency Medicine

## 2018-12-23 ENCOUNTER — Other Ambulatory Visit: Payer: Self-pay

## 2018-12-23 DIAGNOSIS — S63501A Unspecified sprain of right wrist, initial encounter: Secondary | ICD-10-CM

## 2018-12-23 DIAGNOSIS — M25531 Pain in right wrist: Secondary | ICD-10-CM | POA: Diagnosis not present

## 2018-12-23 NOTE — Discharge Instructions (Signed)
°  You may continue to take your previously prescribed ibuprofen 3 times daily to help with pain and swelling. It is also recommended you apply a cool compress to your wrist 3-4 times daily for the next 3-4 days. You may try a warm compress and alternate cool and warm depending on which feels more comfortable to you.  It is recommended you call to schedule a follow up appointment with Sports Medicine or a previously established orthopedist if not improving in 1-2 weeks as they may decide to perform more imaging such as ultrasound or an MRI.

## 2018-12-23 NOTE — ED Triage Notes (Signed)
Here with right wrist swelling and pain after getting hand caught in shopping cart yesterday evening. Unable to bend or make fist. Swelling noted at wrist area; achy constant pain 8/10. Tried ice, brace and Ibuprofen.

## 2018-12-23 NOTE — ED Provider Notes (Signed)
[facility]    CSN: [id number] Arrival date & time: 12/23/18  1318      History   Chief Complaint Chief Complaint  Patient presents with  . Wrist Pain    HPI Yolanda Weaver is a [age] y.o. female.   HPI Yolanda Weaver is a 48 y.o. female presenting to UC with c/o Right wrist pain, swelling, decreased ROM that started yesterday afternoon after lifting a shopping cart while trying to get around items in a narrow isle.  She applied ice and her son's wrist splint last night and took 800mg  ibuprofen, last dose was at 3AM this morning. She is Right hand dominant. Pain is 9/10.  No prior hx of wrist problems.    Past Medical History:  Diagnosis Date  . Acne   . Allergy   . Anxiety   . Bipolar 1 disorder (HCC)   . Blood transfusion    h/o tx for rh incompatibility during pregnancy. see note re significant antibody  . Contraception   . Depression   . Hypertension   . Hypothyroid   . Mental disorder    bipolar  . Migraine   . Obesity   . PONV (postoperative nausea and vomiting)   . Ptosis, right eyelid   . Recurrent upper respiratory infection (URI)    reports she had bronchitis, treated /w antibiotics in past month    Patient Active Problem List   Diagnosis Date Noted  . S/P cervical spinal fusion 08/31/2016  . S/P lumbar spinal fusion 01/15/2015  . Hypothyroid 07/01/2010  . Migraine headache 07/01/2010  . Bipolar disorder (HCC) 07/01/2010  . Allergic rhinitis 07/01/2010    Past Surgical History:  Procedure Laterality Date  . DIAGNOSTIC LAPAROSCOPY     evaluation for endometriosis  . FOOT FOREIGN BODY REMOVAL    . KNEE ARTHROSCOPY     x3 r  . LUMBAR FUSION  01/15/2015  . LUMBAR LAMINECTOMY  04/08/2011   Procedure: MICRODISCECTOMY LUMBAR LAMINECTOMY;  Surgeon: Eldred MangesMark C Yates, MD;  Location[facility] OR;  Service: Orthopedics;  Laterality: Right;  Right L5-S1 Microdiscectomy  . MAXIMUM ACCESS (MAS)POSTERIOR LUMBAR INTERBODY FUSION (PLIF) 1 LEVEL N/A 01/15/2015   Procedure: LUMBAR FIVE-SACRA L-ONE MAXIMUM ACCESS SURGERY POSTERIOR LUMBAR INTERBODY FUSION ;  Surgeon: Tia Alertavid S Jones, MD;  Location: [facility];  Service: Neurosurgery;  Laterality: N/A;  . THYROIDECTOMY, PARTIAL      OB History   No obstetric history on file.      Home Medications    Prior to Admission medications   Medication Sig Start Date End Date Taking? Authorizing Provider  amoxicillin-clavulanate (AUGMENTIN) 875-125 MG tablet Take 1 tablet by mouth 2 (two) times daily. 05/25/18   Tysinger, Kermit Baloavid S, PA-C  ibuprofen (ADVIL,MOTRIN) 800 MG tablet TAKE 1 TABLET BY MOUTH EVERY 8 HOURS AS NEEDED WITH FOOD Patient taking differently: TAKE 1 TABLET BY MOUTH EVERY 8 HOURS AS NEEDED WITH FOOD FOR PAIN 02/13/13   Delories HeinzEgerton, Kathryn P, DPM  lamoTRIgine (LAMICTAL) 200 MG tablet TAKE 1 AND 1/2 TABLETS BY MOUTH AT BEDTIME 09/14/18   Ronnald NianLalonde, John C, MD  levonorgestrel (MIRENA, 52 MG,) 20 MCG/24HR IUD Mirena 20 mcg/24 hr (5 years) intrauterine device  Take by intrauterine route.    [provider]  levothyroxine (SYNTHROID) 50 MCG tablet Take 1 tablet (50 mcg total) by mouth at bedtime. 09/14/18   Ronnald NianLalonde, John C, MD  tiZANidine (ZANAFLEX) 4 MG tablet Take 4 mg by mouth every 6 (six) hours as  needed for muscle spasms.    [provider]  VENTOLIN HFA 108 (90 Base) MCG/ACT inhaler TAKE 2 PUFFS BY MOUTH EVERY 6 HOURS AS NEEDED FOR WHEEZE OR SHORTNESS OF BREATH 06/19/18   Tysinger, Camelia Eng, PA-C    Family History Family History  Problem Relation Age of Onset  . COPD Father   . Cancer Other   . Hypertension Other   . Diabetes Other   . Anesthesia problems Neg Hx   . Hypotension Neg Hx   . Malignant hyperthermia Neg Hx   . Pseudochol deficiency Neg Hx     Social History Social History   Tobacco Use  . Smoking status: Former Smoker    Quit date: 02/07/2001    Years since quitting: 17.8  . Smokeless tobacco: Never Used  Substance Use Topics  . Alcohol use: Yes     Alcohol/week: 1.0 standard drinks    Types: 1 Glasses of wine per week    Comment: couple times a week  . Drug use: No     Allergies   Latex, Acetaminophen, Dilaudid [hydromorphone hcl], Erythromycin, Hydrocodone, Meloxicam, and Oxycodone   Review of Systems Review of Systems  Musculoskeletal: Positive for arthralgias, joint swelling and myalgias.  Skin: Negative for color change and wound.  Neurological: Positive for weakness and numbness.       Right hand and wrist due to pain     Physical Exam Triage Vital Signs ED Triage Vitals  Enc Vitals Group     BP 12/23/18 1346 137/84     Pulse Rate 12/23/18 1346 74     Resp --      Temp 12/23/18 1346 98.1 F (36.7 C)     Temp Source 12/23/18 1346 Oral     SpO2 12/23/18 1346 97 %     Weight 12/23/18 1348 190 lb (86.2 kg)     Height 12/23/18 1348 5\' 6"  (1.676 m)     Head Circumference --      Peak Flow --      Pain Score 12/23/18 1347 9     Pain Loc --      Pain Edu? --      Excl. in [place]? --    No data found.  Updated Vital Signs BP 137/84 (BP Location: Right Arm)   Pulse 74   Temp 98.1 F (36.7 C) (Oral)   Ht 5\' 6"  (1.676 m)   Wt 190 lb (86.2 kg)   SpO2 97%   BMI 30.67 kg/m   Visual Acuity Right Eye Distance:   Left Eye Distance:   Bilateral Distance:    Right Eye Near:   Left Eye Near:    Bilateral Near:     Physical Exam Vitals signs and nursing note reviewed.  Constitutional:      Appearance: Normal appearance. She is Weaver-developed.  HENT:     Head: Normocephalic and atraumatic.  Neck:     Musculoskeletal: Normal range of motion.  Cardiovascular:     Rate and Rhythm: Normal rate and regular rhythm.     Pulses:          Radial pulses are 2+ on the right side.  Pulmonary:     Effort: Pulmonary effort is normal.  Musculoskeletal:        General: Swelling and tenderness present.     Right wrist: She exhibits decreased range of motion, tenderness and swelling (mild). She exhibits no crepitus and no  deformity.  Skin:    General: Skin  is warm and dry.     Capillary Refill: Capillary refill takes less than 2 seconds.     Findings: No bruising or erythema.  Neurological:     Mental Status: She is alert and oriented to person, place, and time.  Psychiatric:        Behavior: Behavior normal.      UC Treatments / Results  Labs (all labs ordered are listed, but only abnormal results are displayed) Labs Reviewed - No data to display  EKG   Radiology Dg Wrist Complete Right  Result Date: 12/23/2018 CLINICAL DATA:  Twisted right wrist while pushing a shopping cart with persistent posterior wrist pain. EXAM: RIGHT WRIST - COMPLETE 3+ VIEW COMPARISON:  None. FINDINGS: No fracture or dislocation. Joint spaces are preserved. No erosions. No evidence of chondrocalcinosis. Regional soft tissues appear normal. No radiopaque foreign body. IMPRESSION: No explanation for patient's right wrist pain Electronically Signed   By: Simonne Come M.D.   On: 12/23/2018 14:12    Procedures Procedures (including critical care time)  Medications Ordered in UC Medications - No data to display  Initial Impression / Assessment and Plan / UC Course  I have reviewed the triage vital signs and the nursing notes.  Pertinent labs & imaging results that were available during my care of the patient were reviewed by me and considered in my medical decision making (see chart for details).     Reviewed imaging with pt Will tx as sprain Due to wrist splint causing more pain last night, ace wrap applied today for comfort but less rigidity.  Encouraged conservative treatment Encouraged f/u with Sports Medicine later this week if not improving. AVS provided  Final Clinical Impressions(s) / UC Diagnoses   Final diagnoses:  Sprain of right wrist, initial encounter     Discharge Instructions      You may continue to take your previously prescribed ibuprofen 3 times daily to help with pain and swelling. It  is also recommended you apply a cool compress to your wrist 3-4 times daily for the next 3-4 days. You may try a warm compress and alternate cool and warm depending on which feels more comfortable to you.  It is recommended you call to schedule a follow up appointment with Sports Medicine or a previously established orthopedist if not improving in 1-2 weeks as they may decide to perform more imaging such as ultrasound or an MRI.     ED Prescriptions    None     I have reviewed the PDMP during this encounter.   Lurene Shadow, PA-C 12/23/18 1452

## 2018-12-24 DIAGNOSIS — M5416 Radiculopathy, lumbar region: Secondary | ICD-10-CM | POA: Diagnosis not present

## 2018-12-26 DIAGNOSIS — M62830 Muscle spasm of back: Secondary | ICD-10-CM | POA: Diagnosis not present

## 2018-12-26 DIAGNOSIS — R531 Weakness: Secondary | ICD-10-CM | POA: Diagnosis not present

## 2018-12-26 DIAGNOSIS — M2560 Stiffness of unspecified joint, not elsewhere classified: Secondary | ICD-10-CM | POA: Diagnosis not present

## 2018-12-26 DIAGNOSIS — M542 Cervicalgia: Secondary | ICD-10-CM | POA: Diagnosis not present

## 2019-01-01 DIAGNOSIS — M62830 Muscle spasm of back: Secondary | ICD-10-CM | POA: Diagnosis not present

## 2019-01-01 DIAGNOSIS — M542 Cervicalgia: Secondary | ICD-10-CM | POA: Diagnosis not present

## 2019-01-01 DIAGNOSIS — M2560 Stiffness of unspecified joint, not elsewhere classified: Secondary | ICD-10-CM | POA: Diagnosis not present

## 2019-01-01 DIAGNOSIS — R531 Weakness: Secondary | ICD-10-CM | POA: Diagnosis not present

## 2019-01-02 ENCOUNTER — Other Ambulatory Visit: Payer: Self-pay

## 2019-01-02 ENCOUNTER — Other Ambulatory Visit (INDEPENDENT_AMBULATORY_CARE_PROVIDER_SITE_OTHER): Payer: BC Managed Care – PPO

## 2019-01-02 DIAGNOSIS — Z23 Encounter for immunization: Secondary | ICD-10-CM

## 2019-01-07 DIAGNOSIS — R531 Weakness: Secondary | ICD-10-CM | POA: Diagnosis not present

## 2019-01-07 DIAGNOSIS — M2560 Stiffness of unspecified joint, not elsewhere classified: Secondary | ICD-10-CM | POA: Diagnosis not present

## 2019-01-07 DIAGNOSIS — M542 Cervicalgia: Secondary | ICD-10-CM | POA: Diagnosis not present

## 2019-01-07 DIAGNOSIS — M62830 Muscle spasm of back: Secondary | ICD-10-CM | POA: Diagnosis not present

## 2019-01-09 DIAGNOSIS — M2560 Stiffness of unspecified joint, not elsewhere classified: Secondary | ICD-10-CM | POA: Diagnosis not present

## 2019-01-09 DIAGNOSIS — M62830 Muscle spasm of back: Secondary | ICD-10-CM | POA: Diagnosis not present

## 2019-01-09 DIAGNOSIS — M542 Cervicalgia: Secondary | ICD-10-CM | POA: Diagnosis not present

## 2019-01-09 DIAGNOSIS — R531 Weakness: Secondary | ICD-10-CM | POA: Diagnosis not present

## 2019-01-10 ENCOUNTER — Encounter: Payer: Self-pay | Admitting: Family Medicine

## 2019-01-10 ENCOUNTER — Telehealth: Payer: Self-pay | Admitting: Family Medicine

## 2019-01-10 NOTE — Telephone Encounter (Signed)
Pt called and said she thinks shes having an issue with her Bronchitis. She has sinus pressure,runny nose, cough and slight sore throat. I offered pt a virtual visit and advised that she should probably get tested for covid and she declined. She want to see if you could call her sometime today if possible

## 2019-01-10 NOTE — Telephone Encounter (Signed)
Pt says she only has a question and would like a call. [place]

## 2019-01-10 NOTE — Telephone Encounter (Signed)
Not comfortable calling Yolanda Weaver.  Set her up for virtual visit

## 2019-01-13 DIAGNOSIS — J019 Acute sinusitis, unspecified: Secondary | ICD-10-CM | POA: Diagnosis not present

## 2019-01-13 DIAGNOSIS — Z20828 Contact with and (suspected) exposure to other viral communicable diseases: Secondary | ICD-10-CM | POA: Diagnosis not present

## 2019-01-13 DIAGNOSIS — J209 Acute bronchitis, unspecified: Secondary | ICD-10-CM | POA: Diagnosis not present

## 2019-01-13 DIAGNOSIS — R05 Cough: Secondary | ICD-10-CM | POA: Diagnosis not present

## 2019-01-13 DIAGNOSIS — R0602 Shortness of breath: Secondary | ICD-10-CM | POA: Diagnosis not present

## 2019-01-14 DIAGNOSIS — F411 Generalized anxiety disorder: Secondary | ICD-10-CM | POA: Diagnosis not present

## 2019-01-22 DIAGNOSIS — F411 Generalized anxiety disorder: Secondary | ICD-10-CM | POA: Diagnosis not present

## 2019-01-24 DIAGNOSIS — M542 Cervicalgia: Secondary | ICD-10-CM | POA: Diagnosis not present

## 2019-01-24 DIAGNOSIS — M961 Postlaminectomy syndrome, not elsewhere classified: Secondary | ICD-10-CM | POA: Diagnosis not present

## 2019-02-04 DIAGNOSIS — F411 Generalized anxiety disorder: Secondary | ICD-10-CM | POA: Diagnosis not present

## 2019-02-08 ENCOUNTER — Other Ambulatory Visit: Payer: Self-pay | Admitting: Family Medicine

## 2019-02-08 MED ORDER — SULFAMETHOXAZOLE-TRIMETHOPRIM 800-160 MG PO TABS: 1.0000 | ORAL_TABLET | Freq: Two times a day (BID) | ORAL | 0 refills | Status: DC

## 2019-02-08 NOTE — Progress Notes (Signed)
Several day history of dysuria,frequency,urgency. I will give Septra

## 2019-02-12 DIAGNOSIS — F411 Generalized anxiety disorder: Secondary | ICD-10-CM | POA: Diagnosis not present

## 2019-02-20 ENCOUNTER — Telehealth: Payer: Self-pay | Admitting: Family Medicine

## 2019-02-20 DIAGNOSIS — N3000 Acute cystitis without hematuria: Secondary | ICD-10-CM

## 2019-02-20 MED ORDER — NITROFURANTOIN MONOHYD MACRO 100 MG PO CAPS: 100.0000 mg | ORAL_CAPSULE | Freq: Two times a day (BID) | ORAL | 0 refills | Status: DC

## 2019-02-20 NOTE — Telephone Encounter (Signed)
She still having frequency and urgency.  I will switch her to Macrobid.

## 2019-02-20 NOTE — Telephone Encounter (Signed)
UTI not better per pt. Need second round, maybe stronger.  She has finished abx.  Still having frequency, lower abd pain, burning.  [facility] [place]

## 2019-03-06 ENCOUNTER — Telehealth: Payer: Self-pay | Admitting: Family Medicine

## 2019-03-06 ENCOUNTER — Other Ambulatory Visit: Payer: Self-pay

## 2019-03-06 ENCOUNTER — Ambulatory Visit: Payer: BC Managed Care – PPO | Admitting: Family Medicine

## 2019-03-06 ENCOUNTER — Encounter: Payer: Self-pay | Admitting: Family Medicine

## 2019-03-06 VITALS — BP 142/88 | HR 88 | Temp 97.8°F | Wt 198.8 lb

## 2019-03-06 DIAGNOSIS — I10 Essential (primary) hypertension: Secondary | ICD-10-CM

## 2019-03-06 DIAGNOSIS — N3000 Acute cystitis without hematuria: Secondary | ICD-10-CM

## 2019-03-06 LAB — POCT URINALYSIS DIP (PROADVANTAGE DEVICE)
Bilirubin, UA: NEGATIVE
Blood, UA: NEGATIVE
Glucose, UA: NEGATIVE mg/dL
Ketones, POC UA: NEGATIVE mg/dL
Leukocytes, UA: NEGATIVE
Nitrite, UA: NEGATIVE
Protein Ur, POC: NEGATIVE mg/dL
Specific Gravity, Urine: 1.025
Urobilinogen, Ur: 0.2
pH, UA: 6 (ref 5.0–8.0)

## 2019-03-06 MED ORDER — HYDROCHLOROTHIAZIDE 12.5 MG PO CAPS: 12.5000 mg | ORAL_CAPSULE | Freq: Every day | ORAL | 1 refills | Status: DC

## 2019-03-06 NOTE — Telephone Encounter (Signed)
Pt called and said her Uti hasnt gone away and she is trying to see if she may need a stronger antibiotic.she said she can schedule a visit with you next week if she needs to.

## 2019-03-06 NOTE — Progress Notes (Signed)
   Subjective:    Patient ID: Yolanda Weaver, female    DOB: 1970/09/14, 49 y.o.   MRN: [id number]  HPI She was treated for UTI on January 1 with Septra and apparently did not respond to that.  She then was placed on Macrobid on January 13 and states that her UTI symptoms have gone away until the last several days when she complains of urgency and dysuria.  She notes no palpable tenderness in the vaginal area.  Also she thinks her blood pressure is starting to go up as she is noting weight gain and some swelling.  She was on blood pressure meds in the past but after surgery it was stopped.   Review of Systems     Objective:   Physical Exam Urine microscopic is negative.  Review of blood pressure indicates she is now elevated again.       Assessment & Plan:  Acute cystitis without hematuria - Plan: Urine Culture  Essential hypertension - Plan: hydrochlorothiazide (MICROZIDE) 12.5 MG capsule Since she is having symptoms, culture is not unreasonable in spite of a negative dipstick.  Also recommend she use Azo-Standard. I will place her on HCTZ and have her return here in 1 month.  She is to buy a blood pressure cuff and bring it in with her.  She was comfortable with that.

## 2019-03-06 NOTE — Telephone Encounter (Signed)
Schedule a visit

## 2019-03-06 NOTE — Addendum Note (Signed)
Addended by: Renelda Loma on: 03/06/2019 04:45 PM   Modules accepted: Orders

## 2019-03-07 LAB — URINE CULTURE: Organism ID, Bacteria: NO GROWTH

## 2019-03-07 NOTE — Telephone Encounter (Signed)
Pt had an appt and will follow up 04-08-19. KH

## 2019-03-27 DIAGNOSIS — F411 Generalized anxiety disorder: Secondary | ICD-10-CM | POA: Diagnosis not present

## 2019-03-29 ENCOUNTER — Other Ambulatory Visit: Payer: Self-pay | Admitting: Family Medicine

## 2019-03-29 DIAGNOSIS — I10 Essential (primary) hypertension: Secondary | ICD-10-CM

## 2019-04-02 DIAGNOSIS — F411 Generalized anxiety disorder: Secondary | ICD-10-CM | POA: Diagnosis not present

## 2019-04-08 ENCOUNTER — Ambulatory Visit: Payer: BC Managed Care – PPO | Admitting: Family Medicine

## 2019-04-08 ENCOUNTER — Other Ambulatory Visit: Payer: Self-pay

## 2019-04-08 VITALS — BP 132/88 | HR 82 | Temp 98.6°F | Wt 197.8 lb

## 2019-04-08 DIAGNOSIS — N3000 Acute cystitis without hematuria: Secondary | ICD-10-CM | POA: Diagnosis not present

## 2019-04-08 DIAGNOSIS — Z638 Other specified problems related to primary support group: Secondary | ICD-10-CM | POA: Diagnosis not present

## 2019-04-08 DIAGNOSIS — I10 Essential (primary) hypertension: Secondary | ICD-10-CM

## 2019-04-08 DIAGNOSIS — F3170 Bipolar disorder, currently in remission, most recent episode unspecified: Secondary | ICD-10-CM | POA: Diagnosis not present

## 2019-04-08 LAB — POCT URINALYSIS DIP (PROADVANTAGE DEVICE)
Bilirubin, UA: NEGATIVE
Glucose, UA: NEGATIVE mg/dL
Ketones, POC UA: NEGATIVE mg/dL
Leukocytes, UA: NEGATIVE
Nitrite, UA: NEGATIVE
Protein Ur, POC: NEGATIVE mg/dL
Specific Gravity, Urine: 1.025
Urobilinogen, Ur: 0.2
pH, UA: 6 (ref 5.0–8.0)

## 2019-04-08 MED ORDER — LOSARTAN POTASSIUM-HCTZ 50-12.5 MG PO TABS: 1.0000 | ORAL_TABLET | Freq: Every day | ORAL | 3 refills | Status: DC
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
## 2019-04-08 NOTE — Patient Instructions (Addendum)
throw away the hydrochlorothiazide.  Send a message on my chart in about a month and let me know what your average blood pressure is doing

## 2019-04-08 NOTE — Addendum Note (Signed)
Addended by: Renelda Loma on: 04/08/2019 05:09 PM   Modules accepted: Orders

## 2019-04-09 ENCOUNTER — Other Ambulatory Visit: Payer: Self-pay | Admitting: Family Medicine

## 2019-04-09 DIAGNOSIS — I10 Essential (primary) hypertension: Secondary | ICD-10-CM

## 2019-04-23 ENCOUNTER — Telehealth: Payer: Self-pay

## 2019-04-23 NOTE — Telephone Encounter (Signed)
Pt. Called and asked me to get a message to you. She would like for you to call her at your earliest convince, I tried to get more info from her and pt. Refused to give any other info.

## 2019-04-29 DIAGNOSIS — Z20828 Contact with and (suspected) exposure to other viral communicable diseases: Secondary | ICD-10-CM | POA: Diagnosis not present

## 2019-05-27 DIAGNOSIS — F411 Generalized anxiety disorder: Secondary | ICD-10-CM | POA: Diagnosis not present

## 2019-06-14 DIAGNOSIS — F411 Generalized anxiety disorder: Secondary | ICD-10-CM | POA: Diagnosis not present

## 2019-06-28 DIAGNOSIS — F411 Generalized anxiety disorder: Secondary | ICD-10-CM | POA: Diagnosis not present

## 2019-08-26 DIAGNOSIS — H40013 Open angle with borderline findings, low risk, bilateral: Secondary | ICD-10-CM | POA: Diagnosis not present

## 2019-08-26 DIAGNOSIS — R42 Dizziness and giddiness: Secondary | ICD-10-CM | POA: Diagnosis not present

## 2019-08-27 ENCOUNTER — Emergency Department (HOSPITAL_BASED_OUTPATIENT_CLINIC_OR_DEPARTMENT_OTHER)
Admission: EM | Admit: 2019-08-27 | Discharge: 2019-08-28 | Disposition: A | Discharge status: Home / Self Care | Payer: BC Managed Care – PPO | Attending: Emergency Medicine | Admitting: Emergency Medicine

## 2019-08-27 ENCOUNTER — Emergency Department (HOSPITAL_BASED_OUTPATIENT_CLINIC_OR_DEPARTMENT_OTHER): Payer: BC Managed Care – PPO

## 2019-08-27 ENCOUNTER — Other Ambulatory Visit: Payer: Self-pay

## 2019-08-27 ENCOUNTER — Encounter (HOSPITAL_BASED_OUTPATIENT_CLINIC_OR_DEPARTMENT_OTHER): Payer: Self-pay

## 2019-08-27 DIAGNOSIS — Z91040 Latex allergy status: Secondary | ICD-10-CM | POA: Diagnosis not present

## 2019-08-27 DIAGNOSIS — F17200 Nicotine dependence, unspecified, uncomplicated: Secondary | ICD-10-CM | POA: Insufficient documentation

## 2019-08-27 DIAGNOSIS — M79602 Pain in left arm: Secondary | ICD-10-CM | POA: Diagnosis not present

## 2019-08-27 DIAGNOSIS — E039 Hypothyroidism, unspecified: Secondary | ICD-10-CM | POA: Diagnosis not present

## 2019-08-27 DIAGNOSIS — M542 Cervicalgia: Secondary | ICD-10-CM | POA: Diagnosis not present

## 2019-08-27 DIAGNOSIS — R0789 Other chest pain: Secondary | ICD-10-CM | POA: Diagnosis not present

## 2019-08-27 DIAGNOSIS — Z79890 Hormone replacement therapy (postmenopausal): Secondary | ICD-10-CM | POA: Diagnosis not present

## 2019-08-27 DIAGNOSIS — Z79899 Other long term (current) drug therapy: Secondary | ICD-10-CM | POA: Diagnosis not present

## 2019-08-27 DIAGNOSIS — I10 Essential (primary) hypertension: Secondary | ICD-10-CM | POA: Diagnosis not present

## 2019-08-27 DIAGNOSIS — M961 Postlaminectomy syndrome, not elsewhere classified: Secondary | ICD-10-CM | POA: Diagnosis not present

## 2019-08-27 DIAGNOSIS — R079 Chest pain, unspecified: Secondary | ICD-10-CM | POA: Diagnosis not present

## 2019-08-27 LAB — CBC WITH DIFFERENTIAL/PLATELET
Abs Immature Granulocytes: 0.05 10*3/uL (ref 0.00–0.07)
Basophils Absolute: 0 10*3/uL (ref 0.0–0.1)
Basophils Relative: 0 %
Eosinophils Absolute: 0 10*3/uL (ref 0.0–0.5)
Eosinophils Relative: 0 %
HCT: 41.1 % (ref 36.0–46.0)
Hemoglobin: 14 g/dL (ref 12.0–15.0)
Immature Granulocytes: 0 %
Lymphocytes Relative: 6 %
Lymphs Abs: 0.8 10*3/uL (ref 0.7–4.0)
MCH: 31.9 pg (ref 26.0–34.0)
MCHC: 34.1 g/dL (ref 30.0–36.0)
MCV: 93.6 fL (ref 80.0–100.0)
Monocytes Absolute: 0.4 10*3/uL (ref 0.1–1.0)
Monocytes Relative: 3 %
Neutro Abs: 10.8 10*3/uL — ABNORMAL HIGH (ref 1.7–7.7)
Neutrophils Relative %: 91 %
Platelets: 324 10*3/uL (ref 150–400)
RBC: 4.39 MIL/uL (ref 3.87–5.11)
RDW: 11.9 % (ref 11.5–15.5)
WBC: 12 10*3/uL — ABNORMAL HIGH (ref 4.0–10.5)
nRBC: 0 % (ref 0.0–0.2)

## 2019-08-27 LAB — BASIC METABOLIC PANEL
Anion gap: 11 (ref 5–15)
BUN: 12 mg/dL (ref 6–20)
CO2: 25 mmol/L (ref 22–32)
Calcium: 9.4 mg/dL (ref 8.9–10.3)
Chloride: 103 mmol/L (ref 98–111)
Creatinine, Ser: 0.7 mg/dL (ref 0.44–1.00)
GFR calc Af Amer: >60 mL/min (ref >60)
GFR calc non Af Amer: >60 mL/min (ref >60)
Glucose, Bld: 120 mg/dL — ABNORMAL HIGH (ref 70–99)
Potassium: 3.9 mmol/L (ref 3.5–5.1)
Sodium: 139 mmol/L (ref 135–145)

## 2019-08-27 LAB — TROPONIN I (HIGH SENSITIVITY)
Troponin I (High Sensitivity): <2 ng/L (ref <18)
Troponin I (High Sensitivity): <2 ng/L (ref <18)

## 2019-08-27 NOTE — ED Provider Notes (Signed)
[facility] EMERGENCY DEPARTMENT Provider Note   CSN: [id number] Arrival date & time: 08/27/19  2006    History Chief Complaint  Patient presents with  . Chest Pain   Ms. Doebler is 49yo F w/ essential hypertension who presents with CP x2hrs. Patient reports L-sided burning CP that radiates to neck and L arm. Pain is not new and says this sometimes happens when her BP gets too high. She says her BP has been high recently due to stressful life events. Reports SBP 200's and subsequent trip to [facility] ED yesterday. Patient says she took her losartan after she eloped from ED and she felt better. She does not regularly take her losartan because "it makes her feel evil." She takes her medication at night therefore has not taken losartan today. Otherwise, patient reports recent hx of dyspnea on exertion and bilateral lower extremity edema while at the beach. Denies fevers, abdominal pain.       Past Medical History:  Diagnosis Date  . Acne   . Allergy   . Anxiety   . Bipolar 1 disorder (HCC)   . Blood transfusion    h/o tx for rh incompatibility during pregnancy. see note re significant antibody  . Contraception   . Depression   . Hypertension   . Hypothyroid   . Mental disorder    bipolar  . Migraine   . Obesity   . PONV (postoperative nausea and vomiting)   . Ptosis, right eyelid   . Recurrent upper respiratory infection (URI)    reports she had bronchitis, treated /w antibiotics in past month   Patient Active Problem List   Diagnosis Date Noted  . S/P cervical spinal fusion 08/31/2016  . S/P lumbar spinal fusion 01/15/2015  . Hypothyroid 07/01/2010  . Migraine headache 07/01/2010  . Bipolar disorder (HCC) 07/01/2010  . Allergic rhinitis 07/01/2010   Past Surgical History:  Procedure Laterality Date  . DIAGNOSTIC LAPAROSCOPY     evaluation for endometriosis  . FOOT FOREIGN BODY REMOVAL    . KNEE ARTHROSCOPY     x3 r  . LUMBAR FUSION  01/15/2015  . LUMBAR  LAMINECTOMY  04/08/2011   Procedure: MICRODISCECTOMY LUMBAR LAMINECTOMY;  Surgeon: Eldred Manges, MD;  Location: [facility] OR;  Service: Orthopedics;  Laterality: Right;  Right L5-S1 Microdiscectomy  . MAXIMUM ACCESS (MAS)POSTERIOR LUMBAR INTERBODY FUSION (PLIF) 1 LEVEL N/A 01/15/2015   Procedure: LUMBAR FIVE-SACRA L-ONE MAXIMUM ACCESS SURGERY POSTERIOR LUMBAR INTERBODY FUSION ;  Surgeon: Tia Alert, MD;  Location: [facility] NEURO ORS;  Service: Neurosurgery;  Laterality: N/A;  . THYROIDECTOMY, PARTIAL      OB History   No obstetric history on file.    Family History  Problem Relation Age of Onset  . COPD Father   . Cancer Other   . Hypertension Other   . Diabetes Other   . Anesthesia problems Neg Hx   . Hypotension Neg Hx   . Malignant hyperthermia Neg Hx   . Pseudochol deficiency Neg Hx    Social History   Tobacco Use  . Smoking status: Current Every Day Smoker  . Smokeless tobacco: Never Used  Vaping Use  . Vaping Use: Never used  Substance Use Topics  . Alcohol use: Yes    Comment: occ  . Drug use: No   Home Medications Prior to Admission medications   Medication Sig Start Date End Date Taking? Authorizing Provider  hydrochlorothiazide (MICROZIDE) 12.5 MG capsule TAKE 1 CAPSULE BY MOUTH EVERY DAY 04/10/19  Ronnald Nian, MD  ibuprofen (ADVIL,MOTRIN) 800 MG tablet TAKE 1 TABLET BY MOUTH EVERY 8 HOURS AS NEEDED WITH FOOD 02/13/13   Delories Heinz, DPM  lamoTRIgine (LAMICTAL) 200 MG tablet TAKE 1 AND 1/2 TABLETS BY MOUTH AT BEDTIME 09/14/18   Ronnald Nian, MD  levonorgestrel (MIRENA, 52 MG,) 20 MCG/24HR IUD Mirena 20 mcg/24 hr (5 years) intrauterine device  Take by intrauterine route.    [provider]  levothyroxine (SYNTHROID) 50 MCG tablet Take 1 tablet (50 mcg total) by mouth at bedtime. 09/14/18   Ronnald Nian, MD  losartan-hydrochlorothiazide (HYZAAR) 50-12.5 MG tablet Take 1 tablet by mouth daily. 04/08/19   Ronnald Nian, MD  nitrofurantoin,  macrocrystal-monohydrate, (MACROBID) 100 MG capsule Take 1 capsule (100 mg total) by mouth 2 (two) times daily. Patient not taking: Reported on 03/06/2019 02/20/19   Ronnald Nian, MD  sulfamethoxazole-trimethoprim (BACTRIM DS) 800-160 MG tablet Take 1 tablet by mouth 2 (two) times daily. Patient not taking: Reported on 03/06/2019 02/08/19   Ronnald Nian, MD  tiZANidine (ZANAFLEX) 4 MG tablet Take 4 mg by mouth every 6 (six) hours as needed for muscle spasms.    [provider]  VENTOLIN HFA 108 (90 Base) MCG/ACT inhaler TAKE 2 PUFFS BY MOUTH EVERY 6 HOURS AS NEEDED FOR WHEEZE OR SHORTNESS OF BREATH 06/19/18   Tysinger, Kermit Balo, PA-C   Allergies    Latex, Acetaminophen, Dilaudid [hydromorphone hcl], Erythromycin, Hydrocodone, Meloxicam, and Oxycodone  Review of Systems   Review of Systems  Physical Exam Updated Vital Signs BP (!) 155/97 (BP Location: Left Arm)   Pulse 97   Temp 98.5 F (36.9 C) (Oral)   Resp 18   Ht 5\' 2"  (1.575 m)   Wt 88.9 kg   SpO2 100%   BMI 35.85 kg/m   Physical Exam Constitutional:      General: She is awake. She is not in acute distress.    Appearance: Normal appearance. She is not ill-appearing or diaphoretic.  HENT:     Head: Normocephalic and atraumatic.  Eyes:     General: Lids are normal.     Conjunctiva/sclera: Conjunctivae normal.  Cardiovascular:     Rate and Rhythm: Normal rate.     Pulses: Normal pulses.     Heart sounds: Normal heart sounds.  Pulmonary:     Effort: Pulmonary effort is normal.     Breath sounds: Normal breath sounds.  Abdominal:     General: Bowel sounds are normal. There is no distension.     Palpations: Abdomen is soft.     Tenderness: There is no abdominal tenderness.  Musculoskeletal:     Right lower leg: No edema.     Left lower leg: No edema.  Neurological:     General: No focal deficit present.     Mental Status: She is alert and oriented to person, place, and time.  Psychiatric:        Behavior:  Behavior is cooperative.        Cognition and Memory: Cognition normal.    ED Results / Procedures / Treatments   Labs (all labs ordered are listed, but only abnormal results are displayed) Labs Reviewed  CBC WITH DIFFERENTIAL/PLATELET - Abnormal; Notable for the following components:      Result Value   WBC 12.0 (*)    Neutro Abs 10.8 (*)    All other components within normal limits  BASIC METABOLIC PANEL - Abnormal; Notable for the following  components:   Glucose, Bld 120 (*)    All other components within normal limits  TROPONIN I (HIGH SENSITIVITY)  TROPONIN I (HIGH SENSITIVITY)    EKG EKG Interpretation  Date/Time:  Tuesday August 27 2019 20:08:49 EDT Ventricular Rate:  93 PR Interval:  130 QRS Duration: 92 QT Interval:  376 QTC Calculation: 467 R Axis:   22 Text Interpretation: Normal sinus rhythm Cannot rule out Anterior infarct , age undetermined Abnormal ECG Confirmed by Tilden Fossa [phone]) on 08/27/2019 8:16:10 PM   Radiology DG Chest 2 View  Result Date: 08/27/2019 CLINICAL DATA:  Chest pain EXAM: CHEST - 2 VIEW COMPARISON:  01/08/2015 FINDINGS: The heart size and mediastinal contours are within normal limits. Both lungs are clear. The visualized skeletal structures are unremarkable. IMPRESSION: No active cardiopulmonary disease. Electronically Signed   By: Helyn Numbers MD   On: 08/27/2019 21:21   Procedures Procedures (including critical care time)  Medications Ordered in ED Medications - No data to display  ED Course  I have reviewed the triage vital signs and the nursing notes.  Pertinent labs & imaging results that were available during my care of the patient were reviewed by me and considered in my medical decision making (see chart for details).   MDM Rules/Calculators/A&P  Patient is 49yo F with HTN who presents to ED with 2hr hx of CP radiating to neck/arm in setting of uncontrolled BP, non-compliant with medications. On exam, HDS, CP  re-producible w/ palpation, no murmurs or signs of acute HF. HEART score 3. Trop neg x1. CXR no active cardiopulmonary disease. SBP remaining 150's in ED. Will re-check trop in 2 hr.     Trop neg x2. Plan to d/c. Offered ibuprofen for pain, but patient has previous prescription and does not need new one. Discussed importance of medication compliance for BP control with patient. Also discussed decreasing life stressors as much as possible. Recommend f/u with PCP within the next week.                      Final Clinical Impression(s) / ED Diagnoses Final diagnoses:  Chest wall pain  Hypertension, essential    Rx / DC Orders ED Discharge Orders    None       Evlyn Kanner, MD 08/27/19 2350    Tilden Fossa, MD 08/28/19 [phone]

## 2019-08-27 NOTE — Discharge Instructions (Signed)
-  Take ibuprofen as needed. -If you develop shortness of breath, palpitations, or worsening chest pain, come to ED. -Important to continue taking hypertension medication (losartan). Follow-up with primary care physician for further management of blood pressure.

## 2019-08-27 NOTE — ED Triage Notes (Signed)
Pt c/o CP x 2 hours-states she presented to [facility] ED yesterday for elevated BP-noncompliant until starting med 2 days ago-no CP at that time yesterday and LWBS-NAD-steady gait

## 2019-08-30 DIAGNOSIS — F411 Generalized anxiety disorder: Secondary | ICD-10-CM | POA: Diagnosis not present

## 2019-09-11 DIAGNOSIS — F411 Generalized anxiety disorder: Secondary | ICD-10-CM | POA: Diagnosis not present

## 2019-09-11 DIAGNOSIS — Z20828 Contact with and (suspected) exposure to other viral communicable diseases: Secondary | ICD-10-CM | POA: Diagnosis not present

## 2019-09-11 DIAGNOSIS — J209 Acute bronchitis, unspecified: Secondary | ICD-10-CM | POA: Diagnosis not present

## 2019-09-11 DIAGNOSIS — J0100 Acute maxillary sinusitis, unspecified: Secondary | ICD-10-CM | POA: Diagnosis not present

## 2019-09-17 ENCOUNTER — Other Ambulatory Visit: Payer: Self-pay

## 2019-09-17 ENCOUNTER — Ambulatory Visit: Payer: BC Managed Care – PPO | Admitting: Family Medicine

## 2019-09-17 ENCOUNTER — Encounter: Payer: Self-pay | Admitting: Family Medicine

## 2019-09-17 VITALS — BP 130/82 | HR 79 | Temp 98.6°F | Wt 195.0 lb

## 2019-09-17 DIAGNOSIS — E039 Hypothyroidism, unspecified: Secondary | ICD-10-CM | POA: Diagnosis not present

## 2019-09-17 DIAGNOSIS — I10 Essential (primary) hypertension: Secondary | ICD-10-CM

## 2019-09-17 MED ORDER — LISINOPRIL-HYDROCHLOROTHIAZIDE 10-12.5 MG PO TABS: 1.0000 | ORAL_TABLET | Freq: Every day | ORAL | 3 refills | Status: DC

## 2019-09-17 NOTE — Progress Notes (Signed)
   Subjective:    Patient ID: Yolanda Weaver, female    DOB: 12/24/1970, 49 y.o.   MRN: [id number]  HPI She is here for consult concerning her blood pressure as well as needing her thyroid checked.  She continues on Synthroid and is having no difficulty with that.  She does state that since being placed on this blood pressure medication she has noted increased difficulty with mood swings and blames it on this medicine.  She has also had a couple of episodes of elevated blood pressure and subsequent emergency room visits.  The medications were not ever changed however.   Review of Systems     Objective:   Physical Exam Alert and in no distress.  Blood pressure is recorded.      Assessment & Plan:  Essential hypertension - Plan: lisinopril-hydrochlorothiazide (ZESTORETIC) 10-12.5 MG tablet  Hypothyroidism, unspecified type - Plan: TSH I will switch her to a different blood pressure medicine and see if that clears up the mood swing issues that she has.  Discussed possible side effects from the lisinopril.  If she has difficulty with that, one option would be to increase her Lamictal.  She will keep me informed.

## 2019-09-18 ENCOUNTER — Other Ambulatory Visit: Payer: Self-pay | Admitting: Family Medicine

## 2019-09-18 DIAGNOSIS — F3170 Bipolar disorder, currently in remission, most recent episode unspecified: Secondary | ICD-10-CM

## 2019-09-18 LAB — TSH: TSH: 1.36 u[IU]/mL (ref 0.450–4.500)

## 2019-09-18 MED ORDER — LEVOTHYROXINE SODIUM 50 MCG PO TABS: 50.0000 ug | ORAL_TABLET | Freq: Every day | ORAL | 3 refills | Status: DC

## 2019-09-18 NOTE — Telephone Encounter (Signed)
[facility] is requesting to fill pt lamictal. Please advise [facility]

## 2019-09-18 NOTE — Addendum Note (Signed)
Addended by: Ronnald Nian on: 09/18/2019 08:15 AM   Modules accepted: Orders

## 2019-09-21 DIAGNOSIS — Z20828 Contact with and (suspected) exposure to other viral communicable diseases: Secondary | ICD-10-CM | POA: Diagnosis not present

## 2019-09-21 DIAGNOSIS — J324 Chronic pansinusitis: Secondary | ICD-10-CM | POA: Diagnosis not present

## 2019-09-24 ENCOUNTER — Other Ambulatory Visit: Payer: Self-pay | Admitting: Family Medicine

## 2019-09-24 DIAGNOSIS — E039 Hypothyroidism, unspecified: Secondary | ICD-10-CM

## 2019-09-24 MED ORDER — LEVOTHYROXINE SODIUM 50 MCG PO TABS: 50.0000 ug | ORAL_TABLET | Freq: Every day | ORAL | 3 refills | Status: DC

## 2019-09-28 ENCOUNTER — Other Ambulatory Visit: Payer: Self-pay | Admitting: Family Medicine

## 2019-09-28 MED ORDER — AMOXICILLIN-POT CLAVULANATE 875-125 MG PO TABS: 1.0000 | ORAL_TABLET | Freq: Two times a day (BID) | ORAL | 0 refills | Status: DC

## 2019-09-28 MED ORDER — PREDNISONE 10 MG (48) PO TBPK: ORAL_TABLET | ORAL | 0 refills | Status: DC

## 2019-09-28 NOTE — Progress Notes (Signed)
She continues to have sinusitis symptoms so will treat with Augmenti and prednisone

## 2019-10-07 DIAGNOSIS — M961 Postlaminectomy syndrome, not elsewhere classified: Secondary | ICD-10-CM | POA: Diagnosis not present

## 2019-10-07 DIAGNOSIS — M542 Cervicalgia: Secondary | ICD-10-CM | POA: Diagnosis not present

## 2019-10-17 ENCOUNTER — Telehealth: Payer: Self-pay | Admitting: Family Medicine

## 2019-10-17 NOTE — Telephone Encounter (Signed)
Pt called and is on the scheduled tomorrow for follow up on BP. She said she is still having symptoms from her bronchitis and sinus. Coughing,SOB and runny nose. She wants to see if she can still come in or need to change to virtual?

## 2019-10-17 NOTE — Telephone Encounter (Signed)
Okay to have her come in but he she has not been Covid tested, we can do that before she comes in the door

## 2019-10-17 NOTE — Telephone Encounter (Signed)
Pt advised.

## 2019-10-18 ENCOUNTER — Encounter: Payer: Self-pay | Admitting: Family Medicine

## 2019-10-18 ENCOUNTER — Other Ambulatory Visit (INDEPENDENT_AMBULATORY_CARE_PROVIDER_SITE_OTHER): Payer: BC Managed Care – PPO

## 2019-10-18 ENCOUNTER — Other Ambulatory Visit: Payer: Self-pay | Admitting: Family Medicine

## 2019-10-18 ENCOUNTER — Ambulatory Visit (INDEPENDENT_AMBULATORY_CARE_PROVIDER_SITE_OTHER): Payer: BC Managed Care – PPO | Admitting: Family Medicine

## 2019-10-18 ENCOUNTER — Other Ambulatory Visit: Payer: Self-pay

## 2019-10-18 VITALS — BP 108/74 | HR 88 | Temp 97.6°F | Wt 198.8 lb

## 2019-10-18 DIAGNOSIS — I10 Essential (primary) hypertension: Secondary | ICD-10-CM | POA: Diagnosis not present

## 2019-10-18 DIAGNOSIS — R0989 Other specified symptoms and signs involving the circulatory and respiratory systems: Secondary | ICD-10-CM | POA: Diagnosis not present

## 2019-10-18 DIAGNOSIS — R059 Cough, unspecified: Secondary | ICD-10-CM

## 2019-10-18 DIAGNOSIS — R05 Cough: Secondary | ICD-10-CM

## 2019-10-18 DIAGNOSIS — R0602 Shortness of breath: Secondary | ICD-10-CM

## 2019-10-18 DIAGNOSIS — J0100 Acute maxillary sinusitis, unspecified: Secondary | ICD-10-CM

## 2019-10-18 LAB — POCT INFLUENZA A/B
Influenza A, POC: NEGATIVE
Influenza B, POC: NEGATIVE

## 2019-10-18 LAB — POC COVID19 BINAXNOW: SARS Coronavirus 2 Ag: NEGATIVE

## 2019-10-18 NOTE — Progress Notes (Signed)
   Subjective:    Patient ID: Yolanda Weaver, female    DOB: [birth year], 49 y.o.   MRN: [id number]  HPI She is here for follow-up on her blood pressure and also underlying sinus disease. She is taking lisinopril/HCTZ and having no difficulty with that. She has had difficulty with the sinus infection was started in early August.  She was placed on Augmentin and then was reevaluated in an urgent care and switch to doxycycline.  They also gave her prednisone but she did not start on that.  On the 21st she called and I switched her back to Augmentin as well as give her a 12-day Dosepak.  She continues to complain of nasal congestion, rhinorrhea, sore throat, earache and cough.  She does have seasonal allergies but these are different than her normal pattern.  Review of Systems     Objective:   Physical Exam Alert and in no distress.  Nasal mucosa appears normal with some tenderness over maxillary sinuses.  Tympanic membranes and canals are normal. Pharyngeal area is normal. Neck is supple without adenopathy or thyromegaly. Cardiac exam shows a regular sinus rhythm without murmurs or gallops. Lungs are clear to auscultation. Covid test was negative      Assessment & Plan:  Essential hypertension  Acute non-recurrent maxillary sinusitis - Plan: CT MAXILLOFACIAL WO CONTRAST, CANCELED: CT MAXILLOFACIAL LTD WO CM May consider switching her to a quinolone to see if that will help but will depend on what the CT scan shows.

## 2019-10-20 LAB — NOVEL CORONAVIRUS, NAA: SARS-CoV-2, NAA: NOT DETECTED

## 2019-10-20 LAB — SARS-COV-2, NAA 2 DAY TAT

## 2019-10-21 ENCOUNTER — Other Ambulatory Visit: Payer: BC Managed Care – PPO

## 2019-10-22 ENCOUNTER — Telehealth: Payer: Self-pay | Admitting: Internal Medicine

## 2019-10-22 NOTE — Telephone Encounter (Signed)
Yes but it has to be a peer to peer done to get it approved. Info is in [facility] message. KH

## 2019-10-22 NOTE — Telephone Encounter (Signed)
It should be pretty straightforward as she has been on several antibiotics without good results as well as steroids.

## 2019-10-22 NOTE — Telephone Encounter (Signed)
Victorino Dike from [facility] said Yolanda Weaver has denied CT maxillfacial and needs peer to peer.   [facility]IM: phone# [phone]  ID: [id number]  CPT: [id number]

## 2019-10-27 ENCOUNTER — Other Ambulatory Visit: Payer: Self-pay | Admitting: Family Medicine

## 2019-10-27 DIAGNOSIS — E039 Hypothyroidism, unspecified: Secondary | ICD-10-CM

## 2019-10-30 ENCOUNTER — Other Ambulatory Visit: Payer: Self-pay

## 2019-10-30 ENCOUNTER — Ambulatory Visit
Admission: RE | Admit: 2019-10-30 | Discharge: 2019-10-30 | Disposition: A | Discharge status: Home / Self Care | Payer: BC Managed Care – PPO | Source: Ambulatory Visit | Attending: Family Medicine | Admitting: Family Medicine

## 2019-10-30 DIAGNOSIS — J342 Deviated nasal septum: Secondary | ICD-10-CM | POA: Diagnosis not present

## 2019-10-30 DIAGNOSIS — J320 Chronic maxillary sinusitis: Secondary | ICD-10-CM | POA: Diagnosis not present

## 2019-10-30 DIAGNOSIS — F411 Generalized anxiety disorder: Secondary | ICD-10-CM | POA: Diagnosis not present

## 2019-10-30 DIAGNOSIS — J3489 Other specified disorders of nose and nasal sinuses: Secondary | ICD-10-CM | POA: Diagnosis not present

## 2019-11-06 DIAGNOSIS — F411 Generalized anxiety disorder: Secondary | ICD-10-CM | POA: Diagnosis not present

## 2019-11-11 ENCOUNTER — Other Ambulatory Visit: Payer: Self-pay

## 2019-11-11 ENCOUNTER — Other Ambulatory Visit (INDEPENDENT_AMBULATORY_CARE_PROVIDER_SITE_OTHER): Payer: BC Managed Care – PPO

## 2019-11-11 ENCOUNTER — Telehealth: Payer: BC Managed Care – PPO | Admitting: Family Medicine

## 2019-11-11 DIAGNOSIS — Z01812 Encounter for preprocedural laboratory examination: Secondary | ICD-10-CM | POA: Diagnosis not present

## 2019-11-11 DIAGNOSIS — Z20822 Contact with and (suspected) exposure to covid-19: Secondary | ICD-10-CM | POA: Diagnosis not present

## 2019-11-11 LAB — POC COVID19 BINAXNOW: SARS Coronavirus 2 Ag: NEGATIVE

## 2019-11-12 DIAGNOSIS — F411 Generalized anxiety disorder: Secondary | ICD-10-CM | POA: Diagnosis not present

## 2019-11-12 LAB — NOVEL CORONAVIRUS, NAA: SARS-CoV-2, NAA: NOT DETECTED

## 2019-11-12 LAB — SARS-COV-2, NAA 2 DAY TAT

## 2019-11-27 DIAGNOSIS — F411 Generalized anxiety disorder: Secondary | ICD-10-CM | POA: Diagnosis not present

## 2019-12-04 DIAGNOSIS — F411 Generalized anxiety disorder: Secondary | ICD-10-CM | POA: Diagnosis not present

## 2019-12-19 ENCOUNTER — Telehealth: Payer: BC Managed Care – PPO | Admitting: Family Medicine

## 2019-12-19 ENCOUNTER — Encounter: Payer: Self-pay | Admitting: Family Medicine

## 2019-12-19 ENCOUNTER — Other Ambulatory Visit: Payer: Self-pay

## 2019-12-19 VITALS — Temp 98.5°F | Wt 196.0 lb

## 2019-12-19 DIAGNOSIS — J0140 Acute pansinusitis, unspecified: Secondary | ICD-10-CM

## 2019-12-19 DIAGNOSIS — R058 Other specified cough: Secondary | ICD-10-CM

## 2019-12-19 MED ORDER — AMOXICILLIN-POT CLAVULANATE 875-125 MG PO TABS: 1.0000 | ORAL_TABLET | Freq: Two times a day (BID) | ORAL | 0 refills | Status: DC

## 2019-12-19 NOTE — Progress Notes (Signed)
° °  Subjective:  Documentation for virtual audio and video telecommunications through Caregility encounter:  The patient was located at home. 2 patient identifiers used.  The provider was located in the office. The patient did consent to this visit and is aware of possible charges through their insurance for this visit.  The other persons participating in this telemedicine service were none. Time spent on call was 12 minutes and in review of previous records 15 minutes total.  This virtual service is not related to other E/M service within previous 7 days.   Patient ID: Yolanda Weaver, female    DOB: [birth year], 49 y.o.   MRN: [id number]  HPI Chief Complaint  Patient presents with   possible sinus infection    sinus infection 3 weeks- runny nose, cough, chest congestion, tried otc meds and no relieve, been treated for sinus since august.   Complains of a 3 week history of frontal headache, sinus pain, nasal congestion, green nasal drainage, productive cough, and scratchy throat. Denies fever, chills, body aches, chest pain, palpitations, shortness of breath, wheezing, N/V/D.  States she has a history of sinusitis with her last infection in August. States her symptoms completely resolved and she became sick again 3 weeks ago.   States she is taking Theraflu.   States she knows she does not have Covid.  Reviewed allergies, medications, past medical, surgical, family, and social history.   Review of Systems Pertinent positives and negatives in the history of present illness.     Objective:   Physical Exam Temp 98.5 F (36.9 C)    Wt 196 lb (88.9 kg)    BMI 35.85 kg/m   Alert and oriented in no acute distress.  Speaking in complete sense without difficulty.      Assessment & Plan:  Acute pansinusitis, recurrence not specified - Plan: amoxicillin-clavulanate (AUGMENTIN) 875-125 MG tablet  Productive cough - Plan: amoxicillin-clavulanate (AUGMENTIN) 875-125 MG  tablet  Reviewed notes regarding her previous sinusitis in August as well as CT maxillofacial. I will treat her with Augmentin since this has worked best for her in the past.  Discussed symptomatic treatment including hydration, Mucinex, Chloraseptic or salt water gargles.  She will follow-up if worsening or not back to baseline when she completes the antibiotic.

## 2020-01-09 DIAGNOSIS — F411 Generalized anxiety disorder: Secondary | ICD-10-CM | POA: Diagnosis not present

## 2020-01-15 ENCOUNTER — Telehealth: Payer: Self-pay | Admitting: Family Medicine

## 2020-01-15 DIAGNOSIS — I10 Essential (primary) hypertension: Secondary | ICD-10-CM

## 2020-01-15 DIAGNOSIS — F411 Generalized anxiety disorder: Secondary | ICD-10-CM | POA: Diagnosis not present

## 2020-01-15 DIAGNOSIS — R058 Other specified cough: Secondary | ICD-10-CM

## 2020-01-15 DIAGNOSIS — T464X5A Adverse effect of angiotensin-converting-enzyme inhibitors, initial encounter: Secondary | ICD-10-CM

## 2020-01-15 MED ORDER — AMLODIPINE BESYLATE 5 MG PO TABS: 5.0000 mg | ORAL_TABLET | Freq: Every day | ORAL | 3 refills | Status: DC

## 2020-01-15 NOTE — Telephone Encounter (Signed)
She has had difficulty with his cough as long as she has been on lisinopril.  She tried losartan before that and did have adverse reaction from that with irritability.  I will switch her to amlodipine.  She is to call me in 1 month and let me know what her blood pressure readings are.

## 2020-01-15 NOTE — Telephone Encounter (Signed)
Please call Re side effects of new BP med  She has had cough since starting bp med  And she is also Retaining fluid and having fatigue

## 2020-01-20 DIAGNOSIS — F411 Generalized anxiety disorder: Secondary | ICD-10-CM | POA: Diagnosis not present

## 2020-01-22 ENCOUNTER — Encounter: Payer: Self-pay | Admitting: Family Medicine

## 2020-01-24 ENCOUNTER — Other Ambulatory Visit: Payer: Self-pay

## 2020-01-24 ENCOUNTER — Ambulatory Visit: Payer: BC Managed Care – PPO | Admitting: Family Medicine

## 2020-01-24 ENCOUNTER — Encounter: Payer: Self-pay | Admitting: Family Medicine

## 2020-01-24 VITALS — BP 122/80 | HR 95 | Temp 98.6°F | Wt 196.0 lb

## 2020-01-24 DIAGNOSIS — Z23 Encounter for immunization: Secondary | ICD-10-CM

## 2020-01-24 DIAGNOSIS — J0140 Acute pansinusitis, unspecified: Secondary | ICD-10-CM | POA: Diagnosis not present

## 2020-01-24 DIAGNOSIS — F17200 Nicotine dependence, unspecified, uncomplicated: Secondary | ICD-10-CM

## 2020-01-24 DIAGNOSIS — J301 Allergic rhinitis due to pollen: Secondary | ICD-10-CM

## 2020-01-24 DIAGNOSIS — R058 Other specified cough: Secondary | ICD-10-CM

## 2020-01-24 MED ORDER — LEVOFLOXACIN 500 MG PO TABS: 500.0000 mg | ORAL_TABLET | Freq: Every day | ORAL | 0 refills | Status: DC

## 2020-01-24 MED ORDER — BENZONATATE 100 MG PO CAPS: 200.0000 mg | ORAL_CAPSULE | Freq: Two times a day (BID) | ORAL | 0 refills | Status: DC | PRN

## 2020-01-24 NOTE — Progress Notes (Signed)
   Subjective:    Patient ID: Yolanda Weaver, female    DOB: 02-16-1970, 49 y.o.   MRN: [id number]  HPI She is here for consult concerning continued difficulty with sinusitis as well as a productive cough.  She has had difficulty with sinusitis since early in the fall and treated with Augmentin.  CT scan was negative.  She has always responded to Augmentin.  She was again treated in November and states that her sinus symptoms cleared up on Augmentin but she still had a productive cough.  She does have underlying allergies and does smoke.  She now states that over the last 5 days she has had increased trouble with sinus congestion, PND, rhinorrhea, earache, productive cough, sneezing.   Review of Systems     Objective:   Physical Exam Alert and in no distress. Tympanic membranes and canals are normal. Pharyngeal area is normal. Neck is supple without adenopathy or thyromegaly. Cardiac exam shows a regular sinus rhythm without murmurs or gallops. Lungs are clear to auscultation.        Assessment & Plan:  Acute pansinusitis, recurrence not specified - Plan: levofloxacin (LEVAQUIN) 500 MG tablet  Productive cough - Plan: levofloxacin (LEVAQUIN) 500 MG tablet, benzonatate (TESSALON) 100 MG capsule  Need for influenza vaccination  Current smoker  Allergic rhinitis due to pollen, unspecified seasonality I discussed the treatment and have decided to use Levaquin.  Discussed possible side effects of the medication including risk for C. difficile as well as tendon issues.  She thinks is worthwhile trying this. Advised her to quit smoking.  Also briefly discussed Covid vaccination which she is opposed to. 35 minutes spent today reviewing her medical record, x-rays as well as Covid counseling and smoking cessation counseling.

## 2020-01-24 NOTE — Patient Instructions (Signed)
Use Claritin or Allegra for the sneezing itchy watery eyes symptoms

## 2020-02-05 ENCOUNTER — Encounter: Payer: Self-pay | Admitting: Family Medicine

## 2020-02-05 ENCOUNTER — Other Ambulatory Visit: Payer: Self-pay | Admitting: Family Medicine

## 2020-02-05 DIAGNOSIS — R058 Other specified cough: Secondary | ICD-10-CM

## 2020-02-05 DIAGNOSIS — J0140 Acute pansinusitis, unspecified: Secondary | ICD-10-CM

## 2020-02-05 MED ORDER — PREDNISONE 10 MG (48) PO TBPK: ORAL_TABLET | ORAL | 0 refills | Status: DC

## 2020-02-05 MED ORDER — LEVOFLOXACIN 500 MG PO TABS: 500.0000 mg | ORAL_TABLET | Freq: Every day | ORAL | 0 refills | Status: DC

## 2020-02-05 NOTE — Telephone Encounter (Signed)
Is this okay to refill? 

## 2020-02-12 ENCOUNTER — Encounter: Payer: Self-pay | Admitting: Family Medicine

## 2020-02-13 ENCOUNTER — Telehealth: Payer: BC Managed Care – PPO | Admitting: Family Medicine

## 2020-02-13 VITALS — BP 138/85 | HR 116 | Wt 196.0 lb

## 2020-02-13 DIAGNOSIS — J0140 Acute pansinusitis, unspecified: Secondary | ICD-10-CM

## 2020-02-13 DIAGNOSIS — J301 Allergic rhinitis due to pollen: Secondary | ICD-10-CM | POA: Diagnosis not present

## 2020-02-13 DIAGNOSIS — I10 Essential (primary) hypertension: Secondary | ICD-10-CM | POA: Diagnosis not present

## 2020-02-13 NOTE — Progress Notes (Signed)
   Subjective:    Patient ID: Yolanda Weaver, female    DOB: Aug 22, 1970, 50 y.o.   MRN: [id number]  HPI I connected with  Yolanda Weaver on 02/13/20 by a video enabled telemedicine application and verified that I am speaking with the correct person using two identifiers.  Caregility used.Me office patient at home I discussed the limitations of evaluation and management by telemedicine. The patient expressed understanding and agreed to proceed. Today's visit is for consult concerning 2 issues.  She has had difficulty with amlodipine causing tremendous pains and when she stopped the pain medication the pains went away.  In the past she had tried plain HCTZ as well as losartan but had difficulty with losartan causing increased irritability and stop that.  She then used lisinopril was concerned about cough but the cough has not changed since she stopped her lisinopril. She also continues have difficulty with sinusitis symptoms.  On her last bout I gave her Levaquin and had to repeat the dosing and also give her steroids.  She is now 5 days away from finishing that was still having difficulty.  I have also done a CT scan which showed minimal changes.  She continues on Flonase.   Review of Systems     Objective:   Physical Exam Alert and in no distress otherwise not examined       Assessment & Plan:  Acute pansinusitis, recurrence not specified - Plan: Ambulatory referral to ENT  Essential hypertension  Allergic rhinitis due to pollen, unspecified seasonality - Plan: Ambulatory referral to ENT She will start taking the lisinopril/HCTZ and send me her readings on my chart in about 1 month. She has not responded to steroids as well as antibiotics and has a scan that is essentially negative.  I will therefore refer to ENT. 32 minutes spent today reviewing her medical record history, documentation and counseling.

## 2020-02-14 DIAGNOSIS — F411 Generalized anxiety disorder: Secondary | ICD-10-CM | POA: Diagnosis not present

## 2020-02-26 ENCOUNTER — Encounter: Payer: Self-pay | Admitting: Family Medicine

## 2020-02-26 MED ORDER — ALBUTEROL SULFATE HFA 108 (90 BASE) MCG/ACT IN AERS: INHALATION_SPRAY | RESPIRATORY_TRACT | 0 refills | Status: DC

## 2020-02-28 DIAGNOSIS — S20211A Contusion of right front wall of thorax, initial encounter: Secondary | ICD-10-CM | POA: Diagnosis not present

## 2020-03-04 DIAGNOSIS — F411 Generalized anxiety disorder: Secondary | ICD-10-CM | POA: Diagnosis not present

## 2020-03-05 ENCOUNTER — Telehealth: Payer: Self-pay

## 2020-03-05 NOTE — Telephone Encounter (Signed)
Pt was called to advised that Dr. Ezzard Standing office is trying to reach her to schedule . Pt was given their office to call back Kh

## 2020-03-10 ENCOUNTER — Ambulatory Visit: Payer: BC Managed Care – PPO | Admitting: Orthopaedic Surgery

## 2020-03-10 ENCOUNTER — Encounter: Payer: Self-pay | Admitting: Orthopaedic Surgery

## 2020-03-10 ENCOUNTER — Other Ambulatory Visit: Payer: Self-pay

## 2020-03-10 ENCOUNTER — Ambulatory Visit: Payer: Self-pay

## 2020-03-10 VITALS — HR 88 | Ht 66.5 in | Wt 193.0 lb

## 2020-03-10 DIAGNOSIS — M25551 Pain in right hip: Secondary | ICD-10-CM

## 2020-03-10 DIAGNOSIS — G8929 Other chronic pain: Secondary | ICD-10-CM

## 2020-03-10 DIAGNOSIS — M25562 Pain in left knee: Secondary | ICD-10-CM

## 2020-03-10 DIAGNOSIS — M25561 Pain in right knee: Secondary | ICD-10-CM | POA: Diagnosis not present

## 2020-03-10 DIAGNOSIS — H40013 Open angle with borderline findings, low risk, bilateral: Secondary | ICD-10-CM | POA: Diagnosis not present

## 2020-03-10 NOTE — Progress Notes (Unsigned)
Office Visit Note   Patient: Yolanda Weaver           Date of Birth: 1970/05/30           MRN: [id number] Visit Date: 03/10/2020              Requested by: Ronnald Nian, MD [address] [place],  [place] [address] PCP: Ronnald Nian, MD   Assessment & Plan: Visit Diagnoses:  1. Pain in right hip   2. Chronic pain of both knees     Plan: Conservative treatment recommended.  We reviewed x-rays which showed minimal arthritic changes with maintained joint space.  No evidence of acute fracture with her fall.  She can follow-up if she develops progressive symptoms.  Follow-Up Instructions: No follow-ups on file.   Orders:  Orders Placed This Encounter  Procedures  . XR HIP UNILAT W OR W/O PELVIS 2-3 VIEWS RIGHT  . XR KNEE 3 VIEW LEFT  . XR Knee 1-2 Views Right   No orders of the defined types were placed in this encounter.     Procedures: No procedures performed   Clinical Data: No additional findings.   Subjective: Chief Complaint  Patient presents with  . Right Hip - Pain  . Left Knee - Pain  . Right Knee - Pain    HPI 50 year old female seen with right hip and bilateral knee pain.  Patient's had previous right knee arthroscopies in the distant past last 02/2001 by me 19 years ago.  She states she had fallen and has had persistent problems when she tries to lay on her right side.  She is taken ibuprofen as well as massage therapy and has had persistent symptoms.  More aching in her right knee than left knee.  Previous lumbar procedure x2.Last lumbar procedure 2006 L5-S1 fusion by Dr. Marikay Alar.  Review of Systems patient has had previous cervical lumbar fusions.  Possible bipolar disorder hypothyroidism.   Objective: Vital Signs: Pulse 88   Ht 5' 6.5" (1.689 m)   Wt 193 lb (87.5 kg)   BMI 30.68 kg/m   Physical Exam Constitutional:      Appearance: She is well-developed.  HENT:     Head: Normocephalic.     Right Ear: External ear normal.      Left Ear: External ear normal.  Eyes:     Pupils: Pupils are equal, round, and reactive to light.  Neck:     Thyroid: No thyromegaly.     Trachea: No tracheal deviation.  Cardiovascular:     Rate and Rhythm: Normal rate.  Pulmonary:     Effort: Pulmonary effort is normal.  Abdominal:     Palpations: Abdomen is soft.  Skin:    General: Skin is warm and dry.  Neurological:     Mental Status: She is alert and oriented to person, place, and time.  Psychiatric:        Mood and Affect: Mood and affect normal.        Behavior: Behavior normal.     Ortho Exam negative logroll right left hip no limitation internal rotation hip flexion contracture distal pulses are 2+.  Knee and ankle jerk are intact.  She has slight tenderness inferior pole the patella normal patellar tendon no subluxation.  Some crepitus with patellar pressure with knee extension.  Collateral ligaments cruciate ligament exam is normal.  Full extension full flexion of both knees.  She is amatory without significant limp.  Specialty Comments:  No specialty comments available.  Imaging: No results found.   PMFS History: Patient Active Problem List   Diagnosis Date Noted  . S/P cervical spinal fusion 08/31/2016  . S/P lumbar spinal fusion 01/15/2015  . Hypothyroid 07/01/2010  . Migraine headache 07/01/2010  . Bipolar disorder (HCC) 07/01/2010  . Allergic rhinitis 07/01/2010   Past Medical History:  Diagnosis Date  . Acne   . Allergy   . Anxiety   . Bipolar 1 disorder (HCC)   . Blood transfusion    h/o tx for rh incompatibility during pregnancy. see note re significant antibody  . Contraception   . Depression   . Hypertension   . Hypothyroid   . Mental disorder    bipolar  . Migraine   . Obesity   . PONV (postoperative nausea and vomiting)   . Ptosis, right eyelid   . Recurrent upper respiratory infection (URI)    reports she had bronchitis, treated /w antibiotics in past month    Family History   Problem Relation Age of Onset  . COPD Father   . Cancer Other   . Hypertension Other   . Diabetes Other   . Anesthesia problems Neg Hx   . Hypotension Neg Hx   . Malignant hyperthermia Neg Hx   . Pseudochol deficiency Neg Hx     Past Surgical History:  Procedure Laterality Date  . DIAGNOSTIC LAPAROSCOPY     evaluation for endometriosis  . FOOT FOREIGN BODY REMOVAL    . KNEE ARTHROSCOPY     x3 r  . LUMBAR FUSION  01/15/2015  . LUMBAR LAMINECTOMY  04/08/2011   Procedure: MICRODISCECTOMY LUMBAR LAMINECTOMY;  Surgeon: Eldred Manges, MD;  Location: [facility] OR;  Service: Orthopedics;  Laterality: Right;  Right L5-S1 Microdiscectomy  . MAXIMUM ACCESS (MAS)POSTERIOR LUMBAR INTERBODY FUSION (PLIF) 1 LEVEL N/A 01/15/2015   Procedure: LUMBAR FIVE-SACRA L-ONE MAXIMUM ACCESS SURGERY POSTERIOR LUMBAR INTERBODY FUSION ;  Surgeon: Tia Alert, MD;  Location: [facility];  Service: Neurosurgery;  Laterality: N/A;  . THYROIDECTOMY, PARTIAL     Social History   Occupational History  . Not on file  Tobacco Use  . Smoking status: Current Every Day Smoker  . Smokeless tobacco: Never Used  Vaping Use  . Vaping Use: Never used  Substance and Sexual Activity  . Alcohol use: Yes    Comment: occ  . Drug use: No  . Sexual activity: Not on file

## 2020-03-11 DIAGNOSIS — F411 Generalized anxiety disorder: Secondary | ICD-10-CM | POA: Diagnosis not present

## 2020-03-18 DIAGNOSIS — F411 Generalized anxiety disorder: Secondary | ICD-10-CM | POA: Diagnosis not present

## 2020-03-21 ENCOUNTER — Other Ambulatory Visit: Payer: Self-pay | Admitting: Family Medicine

## 2020-03-23 NOTE — Telephone Encounter (Signed)
[facility] is requesting to fill pt albuterol. Please advise KH 

## 2020-04-01 DIAGNOSIS — F411 Generalized anxiety disorder: Secondary | ICD-10-CM | POA: Diagnosis not present

## 2020-04-06 ENCOUNTER — Ambulatory Visit (INDEPENDENT_AMBULATORY_CARE_PROVIDER_SITE_OTHER): Payer: BC Managed Care – PPO | Admitting: Otolaryngology

## 2020-04-06 ENCOUNTER — Other Ambulatory Visit: Payer: Self-pay

## 2020-04-06 ENCOUNTER — Encounter (INDEPENDENT_AMBULATORY_CARE_PROVIDER_SITE_OTHER): Payer: Self-pay | Admitting: Otolaryngology

## 2020-04-06 VITALS — Temp 95.4°F

## 2020-04-06 DIAGNOSIS — K219 Gastro-esophageal reflux disease without esophagitis: Secondary | ICD-10-CM | POA: Diagnosis not present

## 2020-04-06 DIAGNOSIS — J310 Chronic rhinitis: Secondary | ICD-10-CM | POA: Diagnosis not present

## 2020-04-06 NOTE — Progress Notes (Signed)
HPI: Yolanda Weaver is a 50 y.o. female who presents is referred by her PCP for evaluation of "sinus issues" that she has had since last summer.  She has been on 2 different rounds of antibiotics as well as prednisone.  She complained of nasal congestion as well as some pressure between her eyes and in her cheek area.  She also feels like she may be having some fungus in her house that may be contributing to her symptoms.  She has been placed on a steroid nasal spray.  She underwent a CT scan of her sinuses in September of last year and review of the CT scan of her sinuses this showed basically clear paranasal sinuses with minimal thickening or small polyp within the right maxillary sinus but no air-fluid levels and no opacifications. She has also had a swallowing issue where she feels like she has difficulty swallowing at times.  She has had previous anterior cervical fusion as well as previous thyroid surgery.  She mentions that she has metal clips in her neck from her thyroid surgery..  Past Medical History:  Diagnosis Date  . Acne   . Allergy   . Anxiety   . Bipolar 1 disorder (HCC)   . Blood transfusion    h/o tx for rh incompatibility during pregnancy. see note re significant antibody  . Contraception   . Depression   . Hypertension   . Hypothyroid   . Mental disorder    bipolar  . Migraine   . Obesity   . PONV (postoperative nausea and vomiting)   . Ptosis, right eyelid   . Recurrent upper respiratory infection (URI)    reports she had bronchitis, treated /w antibiotics in past month   Past Surgical History:  Procedure Laterality Date  . DIAGNOSTIC LAPAROSCOPY     evaluation for endometriosis  . FOOT FOREIGN BODY REMOVAL    . KNEE ARTHROSCOPY     x3 r  . LUMBAR FUSION  01/15/2015  . LUMBAR LAMINECTOMY  04/08/2011   Procedure: MICRODISCECTOMY LUMBAR LAMINECTOMY;  Surgeon: Eldred Manges, MD;  Location: [facility] OR;  Service: Orthopedics;  Laterality: Right;  Right L5-S1 Microdiscectomy   . MAXIMUM ACCESS (MAS)POSTERIOR LUMBAR INTERBODY FUSION (PLIF) 1 LEVEL N/A 01/15/2015   Procedure: LUMBAR FIVE-SACRA L-ONE MAXIMUM ACCESS SURGERY POSTERIOR LUMBAR INTERBODY FUSION ;  Surgeon: Tia Alert, MD;  Location: [facility];  Service: Neurosurgery;  Laterality: N/A;  . THYROIDECTOMY, PARTIAL     Social History   Socioeconomic History  . Marital status: Married    Spouse name: Not on file  . Number of children: Not on file  . Years of education: Not on file  . Highest education level: Not on file  Occupational History  . Not on file  Tobacco Use  . Smoking status: Current Every Day Smoker    Years: 17.00    Start date: 2003  . Smokeless tobacco: Never Used  . Tobacco comment: stopped for a while  Vaping Use  . Vaping Use: Never used  Substance and Sexual Activity  . Alcohol use: Yes    Comment: occ  . Drug use: No  . Sexual activity: Not on file  Other Topics Concern  . Not on file  Social History Narrative  . Not on file   Social Determinants of Health   Financial Resource Strain: Not on file  Food Insecurity: Not on file  Transportation Needs: Not on file  Physical Activity: Not on file  Stress:  Not on file  Social Connections: Not on file   Family History  Problem Relation Age of Onset  . COPD Father   . Cancer Other   . Hypertension Other   . Diabetes Other   . Anesthesia problems Neg Hx   . Hypotension Neg Hx   . Malignant hyperthermia Neg Hx   . Pseudochol deficiency Neg Hx    Allergies  Allergen Reactions  . Latex Rash  . Acetaminophen Nausea And Vomiting  . Dilaudid [Hydromorphone Hcl] Nausea And Vomiting  . Erythromycin Rash  . Hydrocodone Nausea And Vomiting  . Meloxicam   . Oxycodone Nausea And Vomiting   Prior to Admission medications   Medication Sig Start Date End Date Taking? Authorizing Provider  albuterol (VENTOLIN HFA) 108 (90 Base) MCG/ACT inhaler TAKE 2 PUFFS BY MOUTH EVERY 6 HOURS AS NEEDED FOR WHEEZE OR SHORTNESS OF  BREATH 03/23/20   Ronnald Nian, MD  amLODipine (NORVASC) 5 MG tablet Take 1 tablet (5 mg total) by mouth daily. Patient not taking: Reported on 02/13/2020 01/15/20   Ronnald Nian, MD  benzonatate (TESSALON) 100 MG capsule TAKE 2 CAPSULES (200 MG TOTAL) BY MOUTH 2 (TWO) TIMES DAILY AS NEEDED FOR COUGH. 02/05/20   Ronnald Nian, MD  ibuprofen (ADVIL,MOTRIN) 800 MG tablet TAKE 1 TABLET BY MOUTH EVERY 8 HOURS AS NEEDED WITH FOOD 02/13/13   Delories Heinz, DPM  lamoTRIgine (LAMICTAL) 200 MG tablet TAKE 1 AND 1/2 TABLETS BY MOUTH AT BEDTIME 09/18/19   Ronnald Nian, MD  levofloxacin (LEVAQUIN) 500 MG tablet Take 1 tablet (500 mg total) by mouth daily. 02/05/20   Ronnald Nian, MD  levonorgestrel (MIRENA) 20 MCG/24HR IUD Mirena 20 mcg/24 hr (5 years) intrauterine device  Take by intrauterine route.    [provider]  levothyroxine (SYNTHROID) 50 MCG tablet TAKE 1 TABLET (50 MCG TOTAL) BY MOUTH AT BEDTIME. 10/28/19   Ronnald Nian, MD  predniSONE (STERAPRED UNI-PAK 48 TAB) 10 MG (48) TBPK tablet Take as per manufacturer's recommendations 02/05/20   Ronnald Nian, MD  tiZANidine (ZANAFLEX) 4 MG tablet Take 4 mg by mouth every 6 (six) hours as needed for muscle spasms.    [provider]     Positive ROS: Otherwise negative  All other systems have been reviewed and were otherwise negative with the exception of those mentioned in the HPI and as above.  Physical Exam: Constitutional: Alert, well-appearing, no acute distress Ears: External ears without lesions or tenderness. Ear canals are clear bilaterally with intact, clear TMs.  Nasal: External nose without lesions. Septum is deviated to the left anteriorly with a cartilaginous portion of the septum..  After decongesting the nose with Afrin nasal endoscopy revealed clear middle meatus bilaterally with no evidence of mucopurulent discharge.  The nasopharynx is clear.  She has mild mucosal edema but no signs of  infection. Oral: Lips and gums without lesions. Tongue and palate mucosa without lesions. Posterior oropharynx clear. Fiberoptic laryngoscopy was performed through the right nostril.  The nasopharynx was clear.  The base of tongue was clear.  The vallecula and epiglottis were normal.  Vocal cords were clear with normal vocal mobility bilaterally.  She had mild edema of the arytenoid mucosa.  I was able to pass the fiberoptic laryngoscope through the upper esophageal sphincter without any difficulty and the upper cervical esophagus was clear with no abnormal mucosal lesions noted.  However she did have mild arytenoid edema which is pale and consistent  with possible reflux symptoms.  But no mucosal abnormalities noted. Neck: No palpable adenopathy or masses.  No palpable thyroid masses. Respiratory: Breathing comfortably  Skin: No facial/neck lesions or rash noted.  Laryngoscopy  Date/Time: 04/06/2020 5:18 PM Performed by: Drema Halon, MD Authorized by: Drema Halon, MD   Consent:    Consent obtained:  Verbal   Consent given by:  Patient Procedure details:    Indications: direct visualization of the upper aerodigestive tract     Medication:  Afrin   Instrument: flexible fiberoptic laryngoscope     Scope location: bilateral nare   Sinus:    Right middle meatus: normal     Left middle meatus: normal     Right nasopharynx: normal     Left nasopharynx: normal   Mouth:    Oropharynx: normal     Vallecula: normal     Base of tongue: normal     Epiglottis: normal   Throat:    Pyriform sinus: normal     True vocal cords: normal   Comments:     On fiberoptic laryngoscopy the upper airway and larynx were normal to examination.  She had mild arytenoid edema which may be indicative of reflux type symptoms but otherwise no mucosal abnormalities noted.  The fiberoptic laryngoscope was passed through the upper esophageal sphincter without difficulty.    Assessment: Chronic  rhinitis with no clinical evidence of active sinus infection.  Review of the recent CT scan showed clear paranasal sinuses. Globus symptoms and dysphagia I suspect are probably more related to laryngeal pharyngeal reflux.  Plan: For the trouble breathing would recommend use of nasal steroid spray although she does not tolerate this well suggested trying Nasacort 1 spray each nostril daily at night as this should help some with nasal congestion.  Also discussed use of saline irrigation such as the Nettie pot or other saline rinses. I prescribed omeprazole 40 mg daily before dinner to see if this helps at all with her complaints of postnasal drainage and chronic throat clearing.  Reassured her of normal examination otherwise.   Narda Bonds, MD   CC:

## 2020-04-07 ENCOUNTER — Other Ambulatory Visit: Payer: Self-pay | Admitting: Family Medicine

## 2020-04-22 DIAGNOSIS — F411 Generalized anxiety disorder: Secondary | ICD-10-CM | POA: Diagnosis not present

## 2020-04-24 ENCOUNTER — Encounter: Payer: Self-pay | Admitting: Family Medicine

## 2020-04-30 ENCOUNTER — Other Ambulatory Visit (INDEPENDENT_AMBULATORY_CARE_PROVIDER_SITE_OTHER): Payer: Self-pay | Admitting: Otolaryngology

## 2020-05-05 DIAGNOSIS — F411 Generalized anxiety disorder: Secondary | ICD-10-CM | POA: Diagnosis not present

## 2020-05-20 DIAGNOSIS — F411 Generalized anxiety disorder: Secondary | ICD-10-CM | POA: Diagnosis not present

## 2020-05-26 ENCOUNTER — Other Ambulatory Visit (INDEPENDENT_AMBULATORY_CARE_PROVIDER_SITE_OTHER): Payer: Self-pay | Admitting: Otolaryngology

## 2020-05-26 DIAGNOSIS — M961 Postlaminectomy syndrome, not elsewhere classified: Secondary | ICD-10-CM | POA: Diagnosis not present

## 2020-05-26 DIAGNOSIS — M5416 Radiculopathy, lumbar region: Secondary | ICD-10-CM | POA: Diagnosis not present

## 2020-05-26 DIAGNOSIS — M542 Cervicalgia: Secondary | ICD-10-CM | POA: Diagnosis not present

## 2020-06-09 ENCOUNTER — Other Ambulatory Visit (INDEPENDENT_AMBULATORY_CARE_PROVIDER_SITE_OTHER): Payer: Self-pay | Admitting: Otolaryngology

## 2020-06-30 DIAGNOSIS — M961 Postlaminectomy syndrome, not elsewhere classified: Secondary | ICD-10-CM | POA: Diagnosis not present

## 2020-07-21 ENCOUNTER — Encounter: Payer: Self-pay | Admitting: Family Medicine

## 2020-07-23 ENCOUNTER — Encounter: Payer: Self-pay | Admitting: Family Medicine

## 2020-07-29 DIAGNOSIS — Z0000 Encounter for general adult medical examination without abnormal findings: Secondary | ICD-10-CM | POA: Diagnosis not present

## 2020-07-31 ENCOUNTER — Other Ambulatory Visit: Payer: Self-pay

## 2020-07-31 ENCOUNTER — Telehealth (INDEPENDENT_AMBULATORY_CARE_PROVIDER_SITE_OTHER): Payer: BC Managed Care – PPO | Admitting: Family Medicine

## 2020-07-31 ENCOUNTER — Encounter: Payer: Self-pay | Admitting: Family Medicine

## 2020-07-31 DIAGNOSIS — R058 Other specified cough: Secondary | ICD-10-CM | POA: Diagnosis not present

## 2020-07-31 DIAGNOSIS — J0140 Acute pansinusitis, unspecified: Secondary | ICD-10-CM | POA: Diagnosis not present

## 2020-07-31 MED ORDER — LEVOFLOXACIN 500 MG PO TABS: 500.0000 mg | ORAL_TABLET | Freq: Every day | ORAL | 0 refills | Status: DC

## 2020-07-31 NOTE — Progress Notes (Signed)
   Subjective:    Patient ID: Yolanda Weaver, female    DOB: 06/16/1970, 50 y.o.   MRN: [id number]  HPI Documentation for virtual audio and video telecommunications through Caregility encounter: The patient was located at home. 2 patient identifiers used.  The provider was located in the office. The patient did consent to this visit and is aware of possible charges through their insurance for this visit. The other persons participating in this telemedicine service were none. Time spent on call was 5 minutes and in review of previous records >20 minutes total for counseling and coordination of care. This virtual service is not related to other E/M service within previous 7 days.  She complains of a 1 week history of nasal congestion, slight dizziness, chest congestion and a productive cough with postnasal drainage but no fever or chills, sore throat or earache.  She has had this in the past and apparently Augmentin was not successful.  Review of Systems     Objective:   Physical Exam Alert and in no distress otherwise not examined       Assessment & Plan:  Acute pansinusitis, recurrence not specified - Plan: levofloxacin (LEVAQUIN) 500 MG tablet  Productive cough - Plan: levofloxacin (LEVAQUIN) 500 MG tablet She will call when she finishes the antibiotic if still not back to normal.

## 2020-09-20 ENCOUNTER — Other Ambulatory Visit: Payer: Self-pay | Admitting: Family Medicine

## 2020-09-20 DIAGNOSIS — F3170 Bipolar disorder, currently in remission, most recent episode unspecified: Secondary | ICD-10-CM

## 2020-09-20 MED ORDER — LAMOTRIGINE 200 MG PO TABS: ORAL_TABLET | ORAL | 1 refills | Status: DC

## 2020-09-21 DIAGNOSIS — M961 Postlaminectomy syndrome, not elsewhere classified: Secondary | ICD-10-CM | POA: Diagnosis not present

## 2020-09-22 DIAGNOSIS — M5416 Radiculopathy, lumbar region: Secondary | ICD-10-CM | POA: Diagnosis not present

## 2020-10-01 ENCOUNTER — Other Ambulatory Visit: Payer: Self-pay | Admitting: Family Medicine

## 2020-10-01 DIAGNOSIS — I10 Essential (primary) hypertension: Secondary | ICD-10-CM

## 2020-10-01 DIAGNOSIS — Z6830 Body mass index (BMI) 30.0-30.9, adult: Secondary | ICD-10-CM | POA: Diagnosis not present

## 2020-10-01 DIAGNOSIS — Z1231 Encounter for screening mammogram for malignant neoplasm of breast: Secondary | ICD-10-CM | POA: Diagnosis not present

## 2020-10-01 DIAGNOSIS — Z01419 Encounter for gynecological examination (general) (routine) without abnormal findings: Secondary | ICD-10-CM | POA: Diagnosis not present

## 2020-10-01 LAB — HM MAMMOGRAPHY

## 2020-10-06 ENCOUNTER — Other Ambulatory Visit: Payer: Self-pay | Admitting: Family Medicine

## 2020-10-06 DIAGNOSIS — I10 Essential (primary) hypertension: Secondary | ICD-10-CM

## 2020-10-06 DIAGNOSIS — E039 Hypothyroidism, unspecified: Secondary | ICD-10-CM | POA: Diagnosis not present

## 2020-10-08 DIAGNOSIS — H40013 Open angle with borderline findings, low risk, bilateral: Secondary | ICD-10-CM | POA: Diagnosis not present

## 2020-10-17 ENCOUNTER — Other Ambulatory Visit: Payer: Self-pay | Admitting: Family Medicine

## 2020-10-17 DIAGNOSIS — E039 Hypothyroidism, unspecified: Secondary | ICD-10-CM

## 2020-10-17 DIAGNOSIS — I10 Essential (primary) hypertension: Secondary | ICD-10-CM

## 2020-10-19 NOTE — Telephone Encounter (Signed)
Pt called and need these medications refilled. She stated that she had labs done at another office on 8/30 and can get you a copy of those if needed.

## 2020-10-20 ENCOUNTER — Telehealth: Payer: Self-pay

## 2020-10-20 ENCOUNTER — Encounter: Payer: Self-pay | Admitting: Family Medicine

## 2020-10-20 ENCOUNTER — Other Ambulatory Visit: Payer: Self-pay | Admitting: Family Medicine

## 2020-10-20 MED ORDER — LISINOPRIL-HYDROCHLOROTHIAZIDE 10-12.5 MG PO TABS: ORAL_TABLET | ORAL | 3 refills | Status: DC

## 2020-10-20 NOTE — Telephone Encounter (Signed)
Pt advised her B/P was   120/70     please advise if b/P med can be filled. KH

## 2020-10-20 NOTE — Addendum Note (Signed)
Addended by: Ronnald Nian on: 10/20/2020 10:01 AM   Modules accepted: Orders

## 2020-10-21 DIAGNOSIS — M961 Postlaminectomy syndrome, not elsewhere classified: Secondary | ICD-10-CM | POA: Diagnosis not present

## 2020-10-21 DIAGNOSIS — I10 Essential (primary) hypertension: Secondary | ICD-10-CM | POA: Diagnosis not present

## 2020-10-21 DIAGNOSIS — M542 Cervicalgia: Secondary | ICD-10-CM | POA: Diagnosis not present

## 2020-10-21 DIAGNOSIS — M5416 Radiculopathy, lumbar region: Secondary | ICD-10-CM | POA: Diagnosis not present

## 2020-10-22 ENCOUNTER — Other Ambulatory Visit: Payer: Self-pay

## 2020-10-22 DIAGNOSIS — I10 Essential (primary) hypertension: Secondary | ICD-10-CM

## 2020-10-22 MED ORDER — LISINOPRIL-HYDROCHLOROTHIAZIDE 10-12.5 MG PO TABS: ORAL_TABLET | ORAL | 1 refills | Status: DC

## 2020-10-22 MED ORDER — LISINOPRIL-HYDROCHLOROTHIAZIDE 10-12.5 MG PO TABS
ORAL_TABLET | ORAL | 3 refills | Status: DC
Start: 2020-10-22 — End: 2020-10-22

## 2020-10-22 NOTE — Telephone Encounter (Signed)
Error Kh 

## 2020-11-03 NOTE — Progress Notes (Signed)
   Subjective:  Documentation for virtual audio and video telecommunications through Caregility encounter:  The patient was located at home. 2 patient identifiers used.  The provider was located in the office. The patient did consent to this visit and is aware of possible charges through their insurance for this visit.  The other persons participating in this telemedicine service were none. Time spent on call was 16 minutes and in review of previous records >20 minutes total.  This virtual service is not related to other E/M service within previous 7 days.   Patient ID: Yolanda Weaver, female    DOB: 03-22-1970, 50 y.o.   MRN: [id number]  HPI Chief Complaint  Patient presents with   Sore Throat    And congestion continues from 10/20/20 with Dr.Lalonde. Took Covid test this past Sunday and a week ago both were negative. Fever, cold chills, body aches. Highest fever was 101.5 on Sunday   Complains of a 10-11 day history of sinus pressure, purulent nasal drainage with blood tinged mucus, chest congestion and cough.   Taking Theraflu.   States she feels like she had the flu.  Several family members with similar illness. Negative Covid tests.   No dizziness, chest pain, palpitations, shortness of breath, abdominal pain, nausea, vomiting or diarrhea.  Review of Systems Pertinent positives and negatives in the history of present illness.     Objective:   Physical Exam Temp (!) 97.5 F (36.4 C)   Wt 185 lb (83.9 kg)   BMI 29.41 kg/m   Alert and oriented in no acute distress.  Respirations unlabored.  Speaking in complete sentences without difficulty.      Assessment & Plan:  Acute sinusitis with symptoms > 10 days - Plan: amoxicillin-clavulanate (AUGMENTIN) 875-125 MG tablet  Fever, unspecified fever cause  Acute pharyngitis, unspecified etiology  Nasal congestion  Discussed symptomatic treatment.  Augmentin prescribed.  Discussed eating yogurt or taking a probiotic while  on the medication.  She will follow-up if worsening or not back to baseline when she completes the antibiotic.

## 2020-11-04 ENCOUNTER — Telehealth: Payer: BC Managed Care – PPO | Admitting: Family Medicine

## 2020-11-04 ENCOUNTER — Encounter: Payer: Self-pay | Admitting: Family Medicine

## 2020-11-04 ENCOUNTER — Other Ambulatory Visit: Payer: Self-pay

## 2020-11-04 VITALS — Temp 97.5°F | Wt 185.0 lb

## 2020-11-04 DIAGNOSIS — R509 Fever, unspecified: Secondary | ICD-10-CM

## 2020-11-04 DIAGNOSIS — R0981 Nasal congestion: Secondary | ICD-10-CM | POA: Diagnosis not present

## 2020-11-04 DIAGNOSIS — J029 Acute pharyngitis, unspecified: Secondary | ICD-10-CM

## 2020-11-04 DIAGNOSIS — J019 Acute sinusitis, unspecified: Secondary | ICD-10-CM

## 2020-11-04 MED ORDER — AMOXICILLIN-POT CLAVULANATE 875-125 MG PO TABS: 1.0000 | ORAL_TABLET | Freq: Two times a day (BID) | ORAL | 0 refills | Status: DC

## 2020-11-23 DIAGNOSIS — M5416 Radiculopathy, lumbar region: Secondary | ICD-10-CM | POA: Diagnosis not present

## 2021-01-09 ENCOUNTER — Other Ambulatory Visit: Payer: Self-pay | Admitting: Family Medicine

## 2021-01-09 DIAGNOSIS — E039 Hypothyroidism, unspecified: Secondary | ICD-10-CM

## 2021-02-09 ENCOUNTER — Other Ambulatory Visit: Payer: Self-pay | Admitting: Family Medicine

## 2021-02-09 DIAGNOSIS — E039 Hypothyroidism, unspecified: Secondary | ICD-10-CM

## 2021-03-21 IMAGING — CT CT MAXILLOFACIAL W/O CM
1 series · 9 of 30 positions shown, 12 images · non-contrast
Comparison: None.

CLINICAL DATA: Sinus infection 2 months with maxillofacial pain.

EXAM:
CT MAXILLOFACIAL WITHOUT CONTRAST
TECHNIQUE: Multidetector CT imaging of the maxillofacial structures was
performed. Multiplanar CT image reconstructions were also generated.

[Series 8: sag soft · sagittal · 0.22mm/px · 9 of 97 slices shown, 12 images]
[im 7/97  brain]
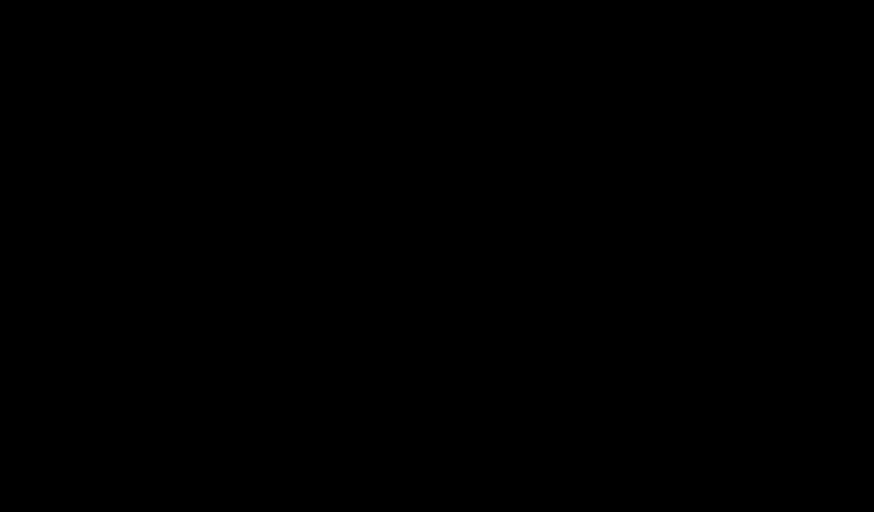
[im 7/97  bone]
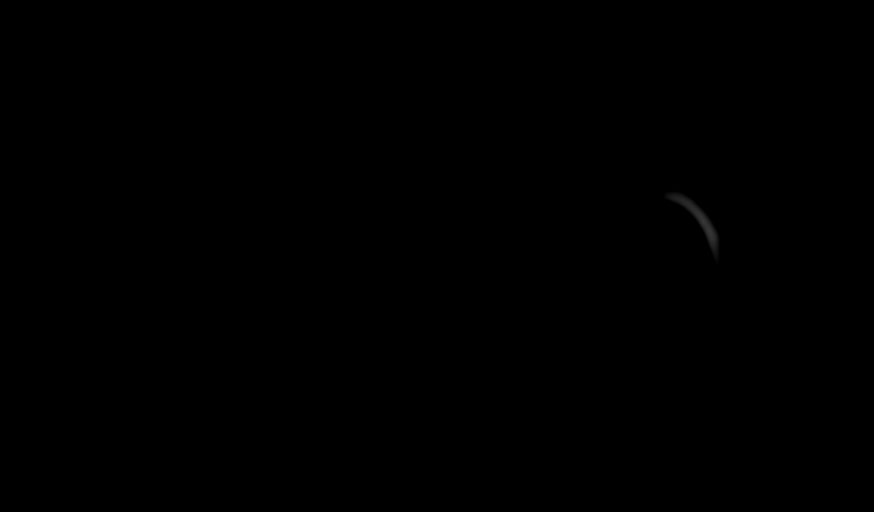
[im 17/97  bone]
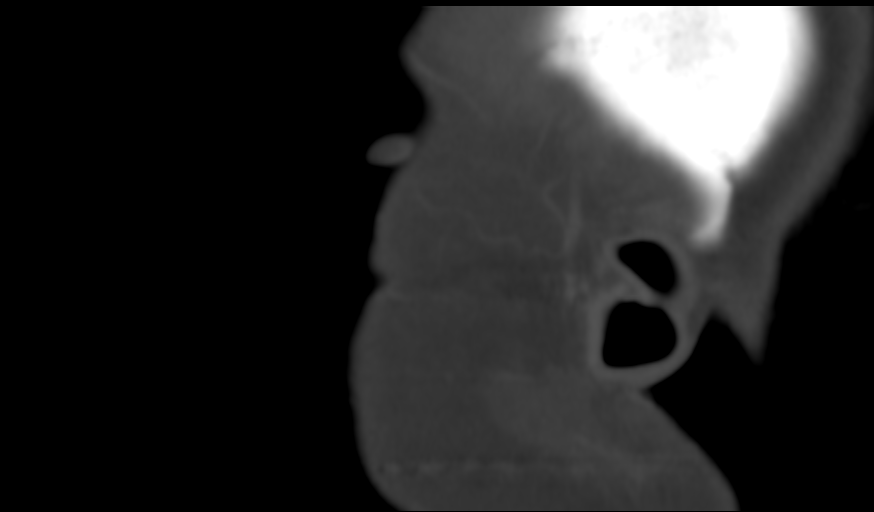
[im 27/97  bone]
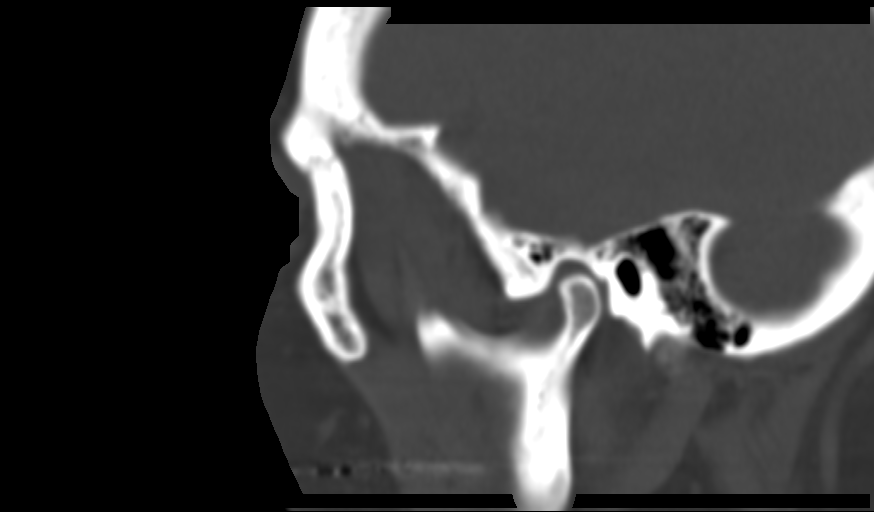
[im 37/97  bone]
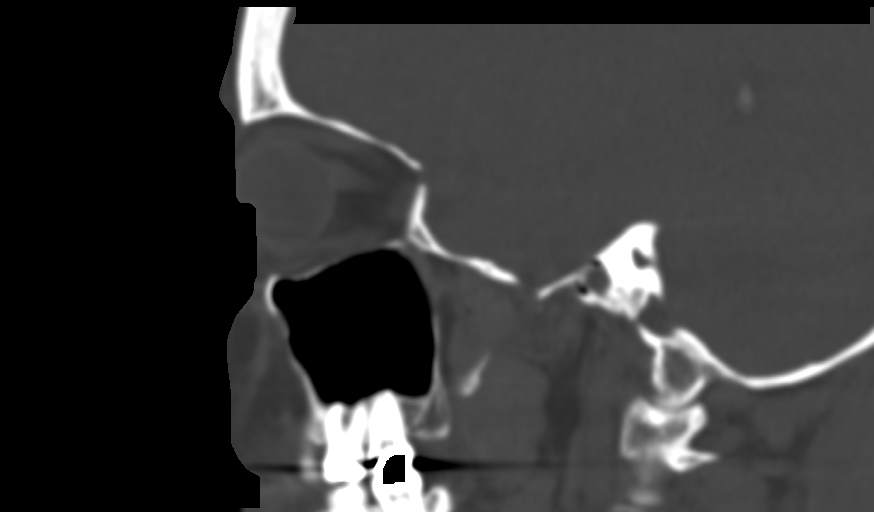
[im 50/97  brain]
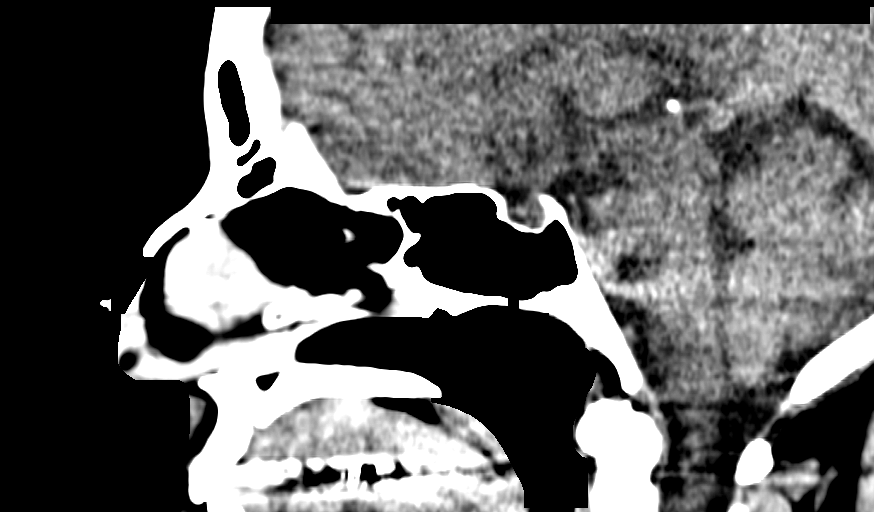
[im 50/97  bone]
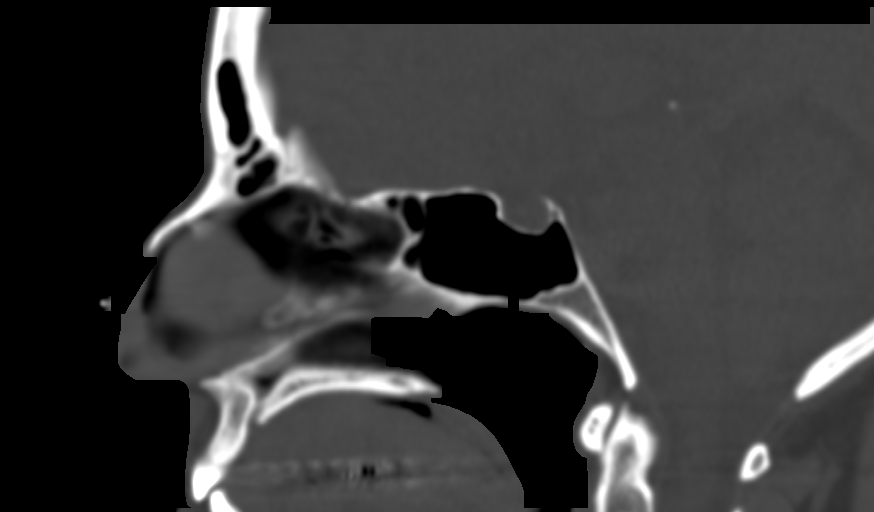
[im 60/97  bone]
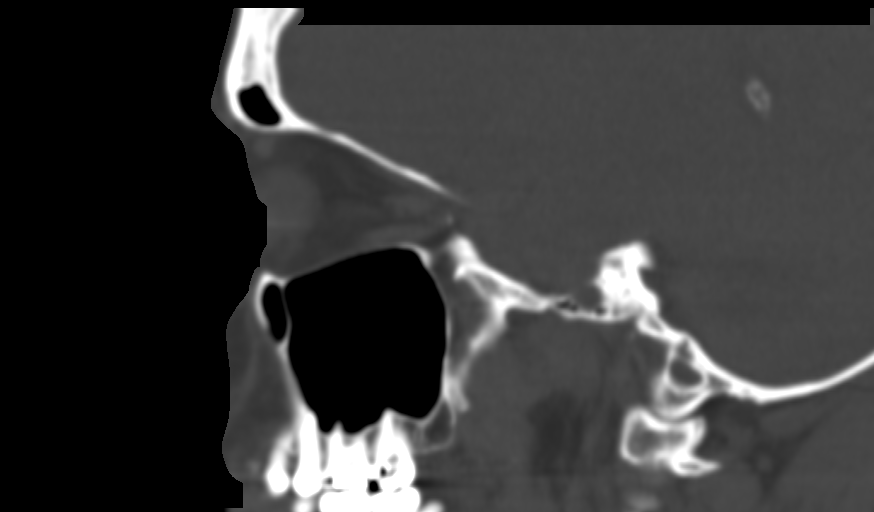
[im 70/97  bone]
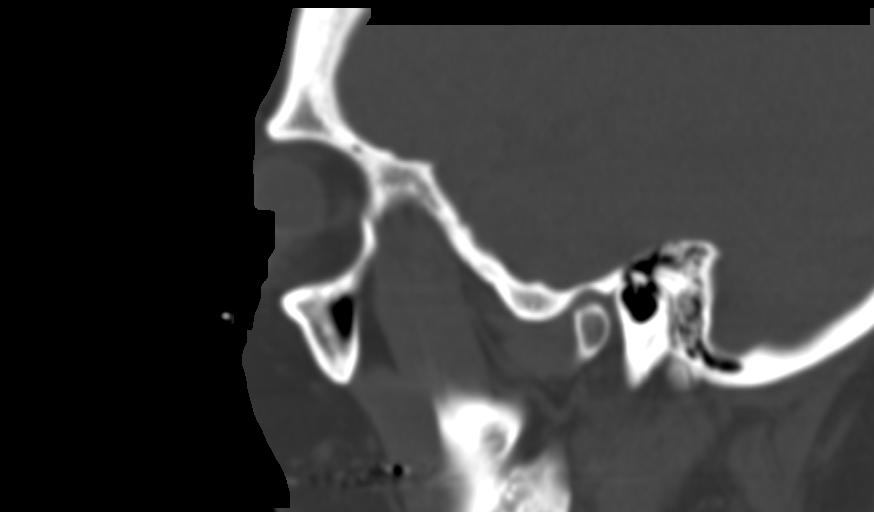
[im 80/97  bone]
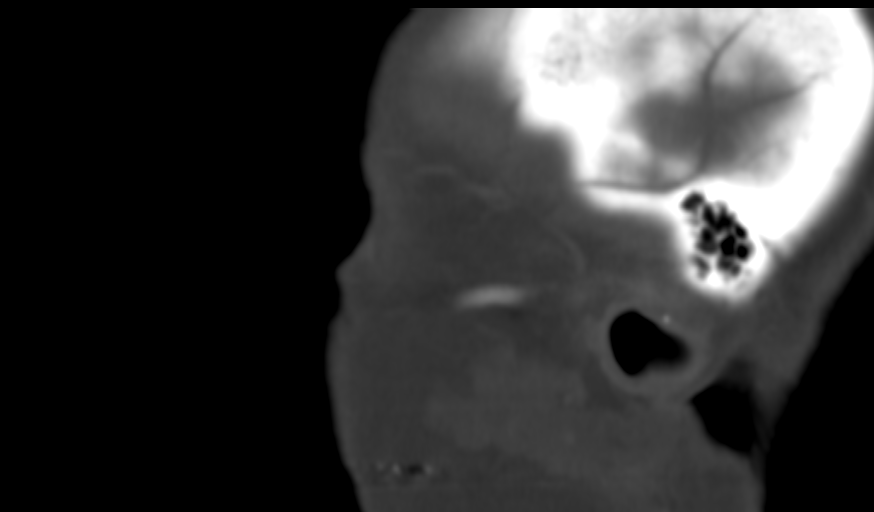
[im 90/97  brain]
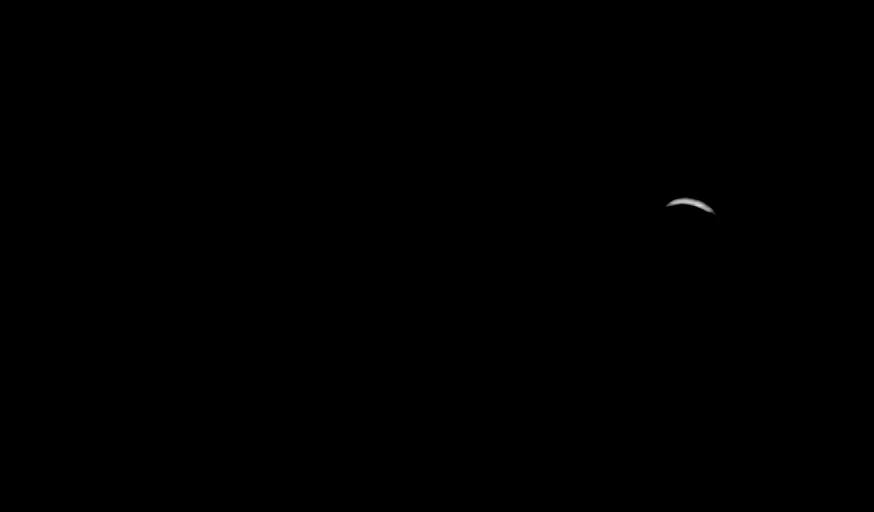
[im 90/97  bone]
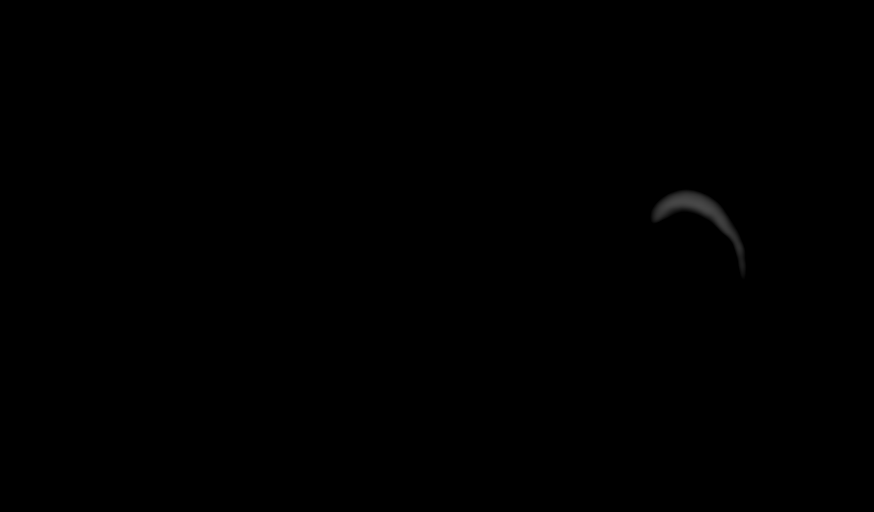

[9 of 30 positions shown; findings below may reference images not displayed]

FINDINGS: Osseous: No fracture or mandibular dislocation. No destructive
process.

Orbits: Negative. No traumatic or inflammatory finding.

Sinuses: Paranasal sinuses are well developed and well aerated
without air-fluid levels or mucoperiosteal thickening. There is
minimal focal mucosal thickening over the medial wall the right
maxillary sinus. Ostiomeatal complexes are patent. Minimal deviation
of the nasal septum to the right. Mastoid air cells are clear.

Soft tissues: Negative.

Limited intracranial: No significant or unexpected finding.
IMPRESSION: 1. No acute findings.
2. Minimal chronic inflammatory change of the right maxillary sinus.

## 2021-03-26 DIAGNOSIS — F411 Generalized anxiety disorder: Secondary | ICD-10-CM | POA: Diagnosis not present

## 2021-03-31 ENCOUNTER — Encounter: Payer: Self-pay | Admitting: Family Medicine

## 2021-03-31 DIAGNOSIS — F3170 Bipolar disorder, currently in remission, most recent episode unspecified: Secondary | ICD-10-CM

## 2021-03-31 DIAGNOSIS — I10 Essential (primary) hypertension: Secondary | ICD-10-CM

## 2021-03-31 MED ORDER — LAMOTRIGINE 200 MG PO TABS: ORAL_TABLET | ORAL | 1 refills | Status: DC

## 2021-03-31 MED ORDER — LISINOPRIL-HYDROCHLOROTHIAZIDE 10-12.5 MG PO TABS: ORAL_TABLET | ORAL | 1 refills | Status: DC

## 2021-04-02 DIAGNOSIS — F411 Generalized anxiety disorder: Secondary | ICD-10-CM | POA: Diagnosis not present

## 2021-04-09 ENCOUNTER — Other Ambulatory Visit: Payer: Self-pay | Admitting: Family Medicine

## 2021-04-09 DIAGNOSIS — E039 Hypothyroidism, unspecified: Secondary | ICD-10-CM

## 2021-05-13 DIAGNOSIS — R5383 Other fatigue: Secondary | ICD-10-CM | POA: Diagnosis not present

## 2021-07-16 ENCOUNTER — Other Ambulatory Visit: Payer: Self-pay | Admitting: Family Medicine

## 2021-07-16 DIAGNOSIS — E039 Hypothyroidism, unspecified: Secondary | ICD-10-CM

## 2021-07-16 NOTE — Telephone Encounter (Signed)
Sent pt mychart message. KH 

## 2021-08-12 ENCOUNTER — Ambulatory Visit: Payer: BC Managed Care – PPO | Admitting: Surgery

## 2021-08-12 ENCOUNTER — Encounter: Payer: Self-pay | Admitting: Surgery

## 2021-08-12 ENCOUNTER — Ambulatory Visit: Payer: Self-pay

## 2021-08-12 VITALS — BP 137/87 | HR 78 | Ht 66.5 in | Wt 185.0 lb

## 2021-08-12 DIAGNOSIS — M25561 Pain in right knee: Secondary | ICD-10-CM | POA: Diagnosis not present

## 2021-08-12 DIAGNOSIS — M1711 Unilateral primary osteoarthritis, right knee: Secondary | ICD-10-CM | POA: Diagnosis not present

## 2021-08-12 DIAGNOSIS — M25562 Pain in left knee: Secondary | ICD-10-CM

## 2021-08-12 DIAGNOSIS — G8929 Other chronic pain: Secondary | ICD-10-CM | POA: Diagnosis not present

## 2021-08-12 MED ORDER — BUPIVACAINE HCL 0.25 % IJ SOLN
6.0000 mL | INTRAMUSCULAR | Status: AC | PRN
  Administered 2021-08-12: 6 mL via INTRA_ARTICULAR

## 2021-08-12 MED ORDER — METHYLPREDNISOLONE ACETATE 40 MG/ML IJ SUSP
80.0000 mg | INTRAMUSCULAR | Status: AC | PRN
  Administered 2021-08-12: 80 mg via INTRA_ARTICULAR

## 2021-08-12 MED ORDER — LIDOCAINE HCL 1 % IJ SOLN
3.0000 mL | INTRAMUSCULAR | Status: AC | PRN
  Administered 2021-08-12: 3 mL

## 2021-08-12 NOTE — Progress Notes (Signed)
Office Visit Note   Patient: Yolanda Weaver           Date of Birth: 08-Dec-1970           MRN: [id number] Visit Date: 08/12/2021              Requested by: Ronnald Nian, MD [address] [place],  [place] [address] PCP: Ronnald Nian, MD   Assessment & Plan: Visit Diagnoses:  1. Chronic pain of both knees   2. Arthritis of knee, right     Plan: In hopes of giving patient improvement of her right knee pain offered injection.  The patient consent right knee prepped with Betadine and intra-articular Marcaine/Depo-Medrol injection performed.  Follow-up in 3 weeks with Dr. Ophelia Charter for recheck.  He will decide at that time if patient needs further imaging studies or discuss other treatment options.  Follow-Up Instructions: No follow-ups on file.   Orders:  Orders Placed This Encounter  Procedures   XR KNEE 3 VIEW RIGHT   No orders of the defined types were placed in this encounter.     Procedures: Large Joint Inj: R knee on 08/12/2021 1:56 PM Indications: pain Details: 25 G 1.5 in needle, anteromedial approach Medications: 3 mL lidocaine 1 %; 6 mL bupivacaine 0.25 %; 80 mg methylPREDNISolone acetate 40 MG/ML Outcome: tolerated well, no immediate complications Consent was given by the patient. Patient was prepped and draped in the usual sterile fashion.       Clinical Data: No additional findings.   Subjective: Chief Complaint  Patient presents with   Right Knee - Pain    HPI 51 year old white female comes in today with complaints of acute on chronic right knee pain.  Patient states that she had 3 right knee arthroscopies by Dr. Ophelia Charter.  I do not have those operative notes immediately available to me.  States that a couple weeks ago she was standing straight up and her knee popped.  She had pain and swelling since.  Aggravated with ambulation and bending. Review of Systems No current cardiopulmonary GI/GU issues  Objective: Vital Signs: BP 137/87   Pulse 78    Ht 5' 6.5" (1.689 m)   Wt 185 lb (83.9 kg)   BMI 29.41 kg/m   Physical Exam HENT:     Head: Normocephalic.     Nose: Nose normal.  Eyes:     Extraocular Movements: Extraocular movements intact.  Pulmonary:     Effort: No respiratory distress.  Musculoskeletal:     Comments: Gait is antalgic.  Right knee range of motion 0 to 110 degrees with some discomfort.  Cruciate collateral ligaments are stable.  She does have some knee swelling without large palpable effusion.  No signs of infection.  Mild patellofemoral crepitus.  Medial lateral joint line tenderness.  Tender medial plica.  Neurological:     Mental Status: She is alert and oriented to person, place, and time.     Ortho Exam  Specialty Comments:  No specialty comments available.  Imaging: No results found.   PMFS History: Patient Active Problem List   Diagnosis Date Noted   S/P cervical spinal fusion 08/31/2016   S/P lumbar spinal fusion 01/15/2015   Hypothyroid 07/01/2010   Migraine headache 07/01/2010   Bipolar disorder (HCC) 07/01/2010   Allergic rhinitis 07/01/2010   Past Medical History:  Diagnosis Date   Acne    Allergy    Anxiety    Bipolar 1 disorder (HCC)    Blood  transfusion    h/o tx for rh incompatibility during pregnancy. see note re significant antibody   Contraception    Depression    Hypertension    Hypothyroid    Mental disorder    bipolar   Migraine    Obesity    PONV (postoperative nausea and vomiting)    Ptosis, right eyelid    Recurrent upper respiratory infection (URI)    reports she had bronchitis, treated /w antibiotics in past month    Family History  Problem Relation Age of Onset   COPD Father    Cancer Other    Hypertension Other    Diabetes Other    Anesthesia problems Neg Hx    Hypotension Neg Hx    Malignant hyperthermia Neg Hx    Pseudochol deficiency Neg Hx     Past Surgical History:  Procedure Laterality Date   DIAGNOSTIC LAPAROSCOPY     evaluation for  endometriosis   FOOT FOREIGN BODY REMOVAL     KNEE ARTHROSCOPY     x3 r   LUMBAR FUSION  01/15/2015   LUMBAR LAMINECTOMY  04/08/2011   Procedure: MICRODISCECTOMY LUMBAR LAMINECTOMY;  Surgeon: Eldred Manges, MD;  Location: [facility] OR;  Service: Orthopedics;  Laterality: Right;  Right L5-S1 Microdiscectomy   MAXIMUM ACCESS (MAS)POSTERIOR LUMBAR INTERBODY FUSION (PLIF) 1 LEVEL N/A 01/15/2015   Procedure: LUMBAR FIVE-SACRA L-ONE MAXIMUM ACCESS SURGERY POSTERIOR LUMBAR INTERBODY FUSION ;  Surgeon: Tia Alert, MD;  Location: [facility];  Service: Neurosurgery;  Laterality: N/A;   THYROIDECTOMY, PARTIAL     right lobe hurthle cell adenoma   Social History   Occupational History   Not on file  Tobacco Use   Smoking status: Every Day    Years: 17.00    Types: Cigarettes    Start date: 2003   Smokeless tobacco: Never   Tobacco comments:    stopped for a while  Vaping Use   Vaping Use: Never used  Substance and Sexual Activity   Alcohol use: Yes    Comment: occ   Drug use: No   Sexual activity: Not on file

## 2021-09-07 ENCOUNTER — Encounter: Payer: Self-pay | Admitting: Medical

## 2021-09-07 ENCOUNTER — Telehealth: Payer: BC Managed Care – PPO | Admitting: Medical

## 2021-09-07 VITALS — BP 148/92 | HR 92 | Temp 101.0°F | Ht 66.5 in | Wt 178.2 lb

## 2021-09-07 DIAGNOSIS — R509 Fever, unspecified: Secondary | ICD-10-CM

## 2021-09-07 DIAGNOSIS — J0300 Acute streptococcal tonsillitis, unspecified: Secondary | ICD-10-CM

## 2021-09-07 MED ORDER — AMOXICILLIN 500 MG PO CAPS: 500.0000 mg | ORAL_CAPSULE | Freq: Three times a day (TID) | ORAL | 0 refills | Status: AC

## 2021-09-07 NOTE — Progress Notes (Signed)
Subjective:     Patient ID: Yolanda Weaver, female   DOB: 1970-09-30, 51 y.o.   MRN: [id number]  This visit type was conducted due to national recommendations for restrictions regarding the COVID-19 Pandemic (e.g. social distancing) in an effort to limit this patient's exposure and mitigate transmission in our community.  Due to their co-morbid illnesses, this patient is at least at moderate risk for complications without adequate follow up.  This format is felt to be most appropriate for this patient at this time.    Documentation for virtual audio and video telecommunications through [place] encounter:  The patient was located at home. The provider was located in the office. The patient did consent to this visit and is aware of possible charges through their insurance for this visit.  The other persons participating in this telemedicine service were none. Time spent on call was 20 minutes and in review of previous records 20 minutes total.  This virtual service is not related to other E/M service within previous 7 days.   HPI Chief Complaint  Patient presents with   other    ST, temperature 102-103 past few days, swollen throat, knives down thru throat, spit up nodule of hard crap with blood, back of throat bright red and has white waves in it as well.    Virtual for illness.  Has had few days of fever, headache, horrible sore throat.   No cough, no congestion.  Feels strongly its strep as she has had similar prior.   Ears hurt too.   No sick contacts  Father passed in 03/2021 unfortunately.   She notes having blood work with her health and wellness doctor, Dr. Lanice Schwab Reagan in 03/2021 and some lab abnormalities were present. She will try to get [place] copy of labs.    Past Medical History:  Diagnosis Date   Acne    Allergy    Anxiety    Bipolar 1 disorder (HCC)    Blood transfusion    h/o tx for rh incompatibility during pregnancy. see note re significant antibody   Contraception     Depression    Hypertension    Hypothyroid    Mental disorder    bipolar   Migraine    Obesity    PONV (postoperative nausea and vomiting)    Ptosis, right eyelid    Recurrent upper respiratory infection (URI)    reports she had bronchitis, treated /w antibiotics in past month   Current Outpatient Medications on File Prior to Visit  Medication Sig Dispense Refill   albuterol (VENTOLIN HFA) 108 (90 Base) MCG/ACT inhaler TAKE 2 PUFFS BY MOUTH EVERY 6 HOURS AS NEEDED FOR WHEEZE OR SHORTNESS OF BREATH 18 each 0   ibuprofen (ADVIL,MOTRIN) 800 MG tablet TAKE 1 TABLET BY MOUTH EVERY 8 HOURS AS NEEDED WITH FOOD 90 tablet 2   lamoTRIgine (LAMICTAL) 200 MG tablet Take 1 and 1/2 tablets at bedtime 135 tablet 1   levonorgestrel (MIRENA) 20 MCG/24HR IUD Mirena 20 mcg/24 hr (5 years) intrauterine device  Take by intrauterine route.     levothyroxine (SYNTHROID) 50 MCG tablet TAKE 1 TABLET (50 MCG TOTAL) BY MOUTH AT BEDTIME. 30 tablet 0   lisinopril-hydrochlorothiazide (ZESTORETIC) 10-12.5 MG tablet TAKE 1 TABLET BY MOUTH EVERY DAYlisinopril 10 mg-hydrochlorothiazide 12.5 mg tablet  TAKE 1 TABLET BY MOUTH EVERY DAY 90 tablet 1   naltrexone (DEPADE) 50 MG tablet Take by mouth daily.     tiZANidine (ZANAFLEX) 4 MG tablet Take 4 mg  by mouth every 6 (six) hours as needed for muscle spasms.     amoxicillin-clavulanate (AUGMENTIN) 875-125 MG tablet Take 1 tablet by mouth 2 (two) times daily. 20 tablet 0   benzonatate (TESSALON) 100 MG capsule TAKE 2 CAPSULES (200 MG TOTAL) BY MOUTH 2 (TWO) TIMES DAILY AS NEEDED FOR COUGH. (Patient not taking: Reported on 09/07/2021) 30 capsule 0   No current facility-administered medications on file prior to visit.   Review of Systems As in subjective    Objective:   Physical Exam Due to coronavirus pandemic stay at home measures, patient visit was virtual and they were not examined in person.   BP (!) 148/92   Pulse 92   Temp (!) 101 F (38.3 C)   Ht 5' 6.5" (1.689  m)   Wt 178 lb 3.2 oz (80.8 kg)   BMI 28.33 kg/m   Gen: wd, wn, nad Seems in pain with throat No muffled voice Tonsils appear swollen and erythematous on camera       Assessment:     Encounter Diagnoses  Name Primary?   Strep tonsillitis Yes   Fever, unspecified fever cause        Plan:     Begin amox empirically, salt water gargles, warm fluids, ibuprofen TID OTC, up to 800mg  TID the next few days, rest.  If not much improved in the next 72 hours, then recheck.  Get copy of your labs from other office from 03/2021.  Yolanda Weaver was seen today for other.  Diagnoses and all orders for this visit:  Strep tonsillitis  Fever, unspecified fever cause  Other orders -     amoxicillin (AMOXIL) 500 MG capsule; Take 1 capsule (500 mg total) by mouth 3 (three) times daily for 10 days.   F/u prn

## 2021-09-22 ENCOUNTER — Ambulatory Visit (INDEPENDENT_AMBULATORY_CARE_PROVIDER_SITE_OTHER): Payer: BC Managed Care – PPO | Admitting: Family Medicine

## 2021-09-22 ENCOUNTER — Encounter: Payer: Self-pay | Admitting: Family Medicine

## 2021-09-22 VITALS — BP 128/82 | HR 74 | Temp 98.6°F | Wt 181.4 lb

## 2021-09-22 DIAGNOSIS — E039 Hypothyroidism, unspecified: Secondary | ICD-10-CM

## 2021-09-22 DIAGNOSIS — Z981 Arthrodesis status: Secondary | ICD-10-CM

## 2021-09-22 DIAGNOSIS — J301 Allergic rhinitis due to pollen: Secondary | ICD-10-CM | POA: Diagnosis not present

## 2021-09-22 DIAGNOSIS — I10 Essential (primary) hypertension: Secondary | ICD-10-CM | POA: Diagnosis not present

## 2021-09-22 DIAGNOSIS — Z1159 Encounter for screening for other viral diseases: Secondary | ICD-10-CM

## 2021-09-22 DIAGNOSIS — Z23 Encounter for immunization: Secondary | ICD-10-CM | POA: Diagnosis not present

## 2021-09-22 DIAGNOSIS — Z1211 Encounter for screening for malignant neoplasm of colon: Secondary | ICD-10-CM

## 2021-09-22 DIAGNOSIS — F3170 Bipolar disorder, currently in remission, most recent episode unspecified: Secondary | ICD-10-CM

## 2021-09-22 DIAGNOSIS — E538 Deficiency of other specified B group vitamins: Secondary | ICD-10-CM

## 2021-09-22 NOTE — Addendum Note (Signed)
Addended by: Ronnald Nian on: 09/22/2021 04:07 PM   Modules accepted: Orders

## 2021-09-22 NOTE — Progress Notes (Signed)
   Subjective:    Patient ID: Yolanda Weaver, female    DOB: 1970-12-31, 51 y.o.   MRN: [id number]  HPI She is here for an interval evaluation.  Her father had died within the last year but she states that so far she has been unable to grieve.  She is in the process of losing weight and so far is lost 46 pounds using naltrexone through a company called [employer].  She did bring in some recent blood work which did show a B12 deficiency.  Also apparently the therapist did decrease her Synthroid down to 25 mcg.  We will need to follow-up on Synthroid.  She continues on Mirena for birth control.  She continues have difficulty with neck and back pain and is being followed by neurosurgery for this.  Apparently surgery is at least pending.  She has also had some right knee pain and is seeing orthopedics for this.  Apparently a steroid injection has not been very useful.  Her allergies are under good control.  She continues on Synthroid, Lamictal, lisinopril/HCTZ.  She rarely uses the Zanaflex.   Review of Systems     Objective:   Physical Exam Alert and in no distress. Tympanic membranes and canals are normal. Pharyngeal area is normal. Neck is supple without adenopathy or thyromegaly. Cardiac exam shows a regular sinus rhythm without murmurs or gallops. Lungs are clear to auscultation.  Exam of the right knee shows no tenderness over the joint line.  Negative McMurray's testing.  Negative anterior drawer. Review of the blood work does show a B12 level of 116.  Thyroid function testing showed a TSH of 1.21.  Vitamin D level was 34.       Assessment & Plan:  Essential hypertension  Allergic rhinitis due to pollen, unspecified seasonality  S/P cervical spinal fusion  Hypothyroidism, unspecified type - Plan: TSH  Bipolar affective disorder in remission (HCC)  S/P lumbar spinal fusion  Vitamin B12 deficiency She will continue with her weight loss.  Recommend she follow-up with blood work  concerning her vitamin B12 level.  I will check a TSH on her.  Continue on her bipolar medications.  Discussed the right knee pain and options of a different kind of an an injection to help.  Also the possibility of eventually needing a total knee replacement.  Encouraged her to continue with weight loss which will help delay the knee surgery.

## 2021-09-23 ENCOUNTER — Encounter: Payer: Self-pay | Admitting: Family Medicine

## 2021-09-23 LAB — TSH: TSH: 1.15 u[IU]/mL (ref 0.450–4.500)

## 2021-09-24 LAB — SPECIMEN STATUS REPORT

## 2021-09-24 LAB — HEPATITIS C ANTIBODY: Hep C Virus Ab: NONREACTIVE

## 2021-10-07 ENCOUNTER — Other Ambulatory Visit: Payer: Self-pay | Admitting: Family Medicine

## 2021-10-07 DIAGNOSIS — E039 Hypothyroidism, unspecified: Secondary | ICD-10-CM

## 2021-10-15 DIAGNOSIS — Z01419 Encounter for gynecological examination (general) (routine) without abnormal findings: Secondary | ICD-10-CM | POA: Diagnosis not present

## 2021-10-15 DIAGNOSIS — Z1231 Encounter for screening mammogram for malignant neoplasm of breast: Secondary | ICD-10-CM | POA: Diagnosis not present

## 2021-10-15 LAB — HM MAMMOGRAPHY

## 2021-10-19 DIAGNOSIS — Z1211 Encounter for screening for malignant neoplasm of colon: Secondary | ICD-10-CM | POA: Diagnosis not present

## 2021-10-23 LAB — COLOGUARD: COLOGUARD: NEGATIVE

## 2021-11-10 DIAGNOSIS — F411 Generalized anxiety disorder: Secondary | ICD-10-CM | POA: Diagnosis not present

## 2021-11-22 ENCOUNTER — Other Ambulatory Visit: Payer: Self-pay | Admitting: Family Medicine

## 2021-11-22 DIAGNOSIS — F3170 Bipolar disorder, currently in remission, most recent episode unspecified: Secondary | ICD-10-CM

## 2021-11-22 NOTE — Telephone Encounter (Signed)
Refill request last apt was 09/22/21

## 2021-11-29 ENCOUNTER — Encounter: Payer: Self-pay | Admitting: Internal Medicine

## 2021-11-30 DIAGNOSIS — F411 Generalized anxiety disorder: Secondary | ICD-10-CM | POA: Diagnosis not present

## 2022-02-14 ENCOUNTER — Telehealth: Payer: Self-pay | Admitting: Family Medicine

## 2022-02-14 NOTE — Telephone Encounter (Signed)
Yolanda Weaver called in and wants to come in for blood work to have all levels of her thyroid checked.

## 2022-02-22 ENCOUNTER — Ambulatory Visit: Payer: PRIVATE HEALTH INSURANCE | Admitting: Orthopaedic Surgery

## 2022-02-22 DIAGNOSIS — M2241 Chondromalacia patellae, right knee: Secondary | ICD-10-CM | POA: Insufficient documentation

## 2022-02-22 NOTE — Progress Notes (Signed)
Office Visit Note   Patient: Yolanda Weaver           Date of Birth: 04/30/1970           MRN: [id number] Visit Date: 02/22/2022              Requested by: Denita Lung, MD [address] [place],  [place] [address] PCP: Denita Lung, MD   Assessment & Plan: Visit Diagnoses:  1. Chondromalacia patellae, right knee     Plan: Will set up some physical therapy her symptoms are likely related to chondromalacia patella does not appear that she has recurrent plica medially.  I can recheck her in about 7 weeks.  Follow-Up Instructions: Return in about 7 weeks (around 04/12/2022).   Orders:  No orders of the defined types were placed in this encounter.  No orders of the defined types were placed in this encounter.     Procedures: No procedures performed   Clinical Data: No additional findings.   Subjective: Chief Complaint  Patient presents with   Right Knee - Pain    HPI 52 year old female returns with ongoing problems with right knee pain with popping and catching.  She has not had any true locking has not fallen.  She had a intra-articular injection of her knee by Benjiman Core, PA-C and states it did not help.  Previous arthroscopy last 1 in the 3295J for recurrent plica medial of the right knee.  She denies groin pain.  Previous history of lumbar microdiscectomy 2013 with later L5-S1 fusion by Dr. Ronnald Ramp..  She denies current radicular symptoms.  Review of Systems all systems noncontributory to HPI.   Objective: Vital Signs: There were no vitals taken for this visit.  Physical Exam Constitutional:      Appearance: She is well-developed.  HENT:     Head: Normocephalic.     Right Ear: External ear normal.     Left Ear: External ear normal. There is no impacted cerumen.  Eyes:     Pupils: Pupils are equal, round, and reactive to light.  Neck:     Thyroid: No thyromegaly.     Trachea: No tracheal deviation.  Cardiovascular:     Rate and Rhythm: Normal  rate.  Pulmonary:     Effort: Pulmonary effort is normal.  Abdominal:     Palpations: Abdomen is soft.  Musculoskeletal:     Cervical back: No rigidity.  Skin:    General: Skin is warm and dry.  Neurological:     Mental Status: She is alert and oriented to person, place, and time.  Psychiatric:        Behavior: Behavior normal.     Ortho Exam patient has crepitus with knee flexion extension full extension flexion past 130 degrees.  Negative logroll hips no palpable Baker's cyst.  Collateral ligaments are stable.  No palpable medial plica.  Positive patella grind test negative patellar subluxation apprehension.  ACL PCL Lachman test is normal.  No lower extremity edema.  Normal heel-toe gait.  Specialty Comments:  No specialty comments available.  Imaging: Previous radiographs 08/22/2021 show standing AP both knees lateral right knee sunrise patella x-ray demonstrated no marginal osteophytes.  Joint spaces maintained.  Slight flattening of medial femoral condyle symmetrical.  Normal patellar position on sunrise patella x-ray.   PMFS History: Patient Active Problem List   Diagnosis Date Noted   Chondromalacia patellae, right knee 02/22/2022   Essential hypertension 09/22/2021   Vitamin B12 deficiency 09/22/2021  S/P cervical spinal fusion 08/31/2016   S/P lumbar spinal fusion 01/15/2015   Hypothyroid 07/01/2010   Migraine headache 07/01/2010   Bipolar disorder ([place]) 07/01/2010   Allergic rhinitis 07/01/2010   Past Medical History:  Diagnosis Date   Acne    Allergy    Anxiety    Bipolar 1 disorder ([place])    Blood transfusion    h/o tx for rh incompatibility during pregnancy. see note re significant antibody   Contraception    Depression    Hypertension    Hypothyroid    Mental disorder    bipolar   Migraine    Obesity    PONV (postoperative nausea and vomiting)    Ptosis, right eyelid    Recurrent upper respiratory infection (URI)    reports she had bronchitis,  treated /w antibiotics in past month    Family History  Problem Relation Age of Onset   COPD Father    Cancer Other    Hypertension Other    Diabetes Other    Anesthesia problems Neg Hx    Hypotension Neg Hx    Malignant hyperthermia Neg Hx    Pseudochol deficiency Neg Hx     Past Surgical History:  Procedure Laterality Date   DIAGNOSTIC LAPAROSCOPY     evaluation for endometriosis   FOOT FOREIGN BODY REMOVAL     KNEE ARTHROSCOPY     x3 r   LUMBAR FUSION  01/15/2015   LUMBAR LAMINECTOMY  04/08/2011   Procedure: MICRODISCECTOMY LUMBAR LAMINECTOMY;  Surgeon: Marybelle Killings, MD;  Location: [facility];  Service: Orthopedics;  Laterality: Right;  Right L5-S1 Microdiscectomy   MAXIMUM ACCESS (MAS)POSTERIOR LUMBAR INTERBODY FUSION (PLIF) 1 LEVEL N/A 01/15/2015   Procedure: LUMBAR FIVE-SACRA L-ONE MAXIMUM ACCESS SURGERY POSTERIOR LUMBAR INTERBODY FUSION ;  Surgeon: Eustace Moore, MD;  Location: [facility];  Service: Neurosurgery;  Laterality: N/A;   THYROIDECTOMY, PARTIAL     right lobe hurthle cell adenoma   Social History   Occupational History   Not on file  Tobacco Use   Smoking status: Every Day    Years: 17.00    Types: Cigarettes    Start date: 2003   Smokeless tobacco: Never   Tobacco comments:    stopped for a while  Vaping Use   Vaping Use: Never used  Substance and Sexual Activity   Alcohol use: Yes    Comment: occ   Drug use: No   Sexual activity: Not on file

## 2022-02-23 ENCOUNTER — Other Ambulatory Visit: Payer: Self-pay

## 2022-02-23 DIAGNOSIS — M2241 Chondromalacia patellae, right knee: Secondary | ICD-10-CM

## 2022-02-23 NOTE — Progress Notes (Signed)
Faxed external PT order [phone]

## 2022-03-04 ENCOUNTER — Telehealth: Payer: Self-pay | Admitting: Orthopaedic Surgery

## 2022-03-04 NOTE — Telephone Encounter (Signed)
[facility] in [place] [facility] ([facility]) was sent a referral for P/t this is not where it is supposed to go it was supposed to go to [facility] in [place]. Just an [place]

## 2022-03-16 ENCOUNTER — Encounter: Payer: Self-pay | Admitting: Family Medicine

## 2022-03-16 ENCOUNTER — Ambulatory Visit (INDEPENDENT_AMBULATORY_CARE_PROVIDER_SITE_OTHER): Payer: PRIVATE HEALTH INSURANCE | Admitting: Family Medicine

## 2022-03-16 VITALS — BP 160/90 | HR 83 | Temp 98.1°F | Resp 16 | Wt 194.6 lb

## 2022-03-16 DIAGNOSIS — E039 Hypothyroidism, unspecified: Secondary | ICD-10-CM

## 2022-03-16 DIAGNOSIS — Z13220 Encounter for screening for lipoid disorders: Secondary | ICD-10-CM

## 2022-03-16 DIAGNOSIS — G43109 Migraine with aura, not intractable, without status migrainosus: Secondary | ICD-10-CM | POA: Diagnosis not present

## 2022-03-16 DIAGNOSIS — I10 Essential (primary) hypertension: Secondary | ICD-10-CM

## 2022-03-16 DIAGNOSIS — Z981 Arthrodesis status: Secondary | ICD-10-CM

## 2022-03-16 DIAGNOSIS — Z1211 Encounter for screening for malignant neoplasm of colon: Secondary | ICD-10-CM

## 2022-03-16 DIAGNOSIS — F3170 Bipolar disorder, currently in remission, most recent episode unspecified: Secondary | ICD-10-CM | POA: Diagnosis not present

## 2022-03-16 DIAGNOSIS — J301 Allergic rhinitis due to pollen: Secondary | ICD-10-CM | POA: Diagnosis not present

## 2022-03-16 DIAGNOSIS — R232 Flushing: Secondary | ICD-10-CM | POA: Insufficient documentation

## 2022-03-16 DIAGNOSIS — E538 Deficiency of other specified B group vitamins: Secondary | ICD-10-CM

## 2022-03-16 LAB — LIPID PANEL

## 2022-03-16 MED ORDER — LAMOTRIGINE 200 MG PO TABS: ORAL_TABLET | ORAL | 3 refills | Status: DC

## 2022-03-16 NOTE — Progress Notes (Signed)
   Subjective:    Patient ID: Yolanda Weaver, female    DOB: 26-Jan-1971, 52 y.o.   MRN: [id number]  HPI She is here for an interval evaluation.  She has been checking her blood pressure at home with a cuff that has been verified as accurate and has readings at home in the 120/80 range.  She has not been on any blood pressure medications in over a year.  She also has been holding her thyroid medication.  She did have a partial thyroidectomy and has held her thyroid medication with the idea of taking what her status is without medication.  She follows up regularly with gynecology and is having some menopausal symptoms.  She does have a Mirena present.  Of note is that she has lost approximately 3 pounds with dietary modification.  She continues have difficulty with neck and back pain and follows up regularly with surgeon.   Review of Systems     Objective:   Physical Exam Alert and in no distress. Tympanic membranes and canals are normal. Pharyngeal area is normal. Neck is supple without adenopathy or thyromegaly. Cardiac exam shows a regular sinus rhythm without murmurs or gallops. Lungs are clear to auscultation.        Assessment & Plan:  Bipolar affective disorder in remission ([place]) - Plan: lamoTRIgine (LAMICTAL) 200 MG tablet  Essential hypertension - Plan: CBC with Differential/Platelet, Comprehensive metabolic panel, Lipid panel  Migraine with aura and without status migrainosus, not intractable  Allergic rhinitis due to pollen, unspecified seasonality - Plan: CBC with Differential/Platelet, Comprehensive metabolic panel  Hypothyroidism, unspecified type - Plan: TSH  Vitamin B12 deficiency - Plan: Vitamin B12  S/P cervical spinal fusion  S/P lumbar spinal fusion  Screening for lipid disorders  Hot flashes I will follow-up on the need for thyroid medication at a later date and continue to monitor her blood pressure.

## 2022-03-16 NOTE — Addendum Note (Signed)
Addended by: Minette Headland A on: 03/16/2022 03:20 PM   Modules accepted: Orders

## 2022-03-17 ENCOUNTER — Encounter: Payer: Self-pay | Admitting: Family Medicine

## 2022-03-17 LAB — CBC WITH DIFFERENTIAL/PLATELET
Basophils Absolute: 0.1 10*3/uL (ref 0.0–0.2)
Basos: 1 %
EOS (ABSOLUTE): 0.2 10*3/uL (ref 0.0–0.4)
Eos: 3 %
Hematocrit: 40.7 % (ref 34.0–46.6)
Hemoglobin: 13.5 g/dL (ref 11.1–15.9)
Immature Grans (Abs): 0 10*3/uL (ref 0.0–0.1)
Immature Granulocytes: 1 %
Lymphocytes Absolute: 1.6 10*3/uL (ref 0.7–3.1)
Lymphs: 22 %
MCH: 30.5 pg (ref 26.6–33.0)
MCHC: 33.2 g/dL (ref 31.5–35.7)
MCV: 92 fL (ref 79–97)
Monocytes Absolute: 0.5 10*3/uL (ref 0.1–0.9)
Monocytes: 8 %
Neutrophils Absolute: 4.7 10*3/uL (ref 1.4–7.0)
Neutrophils: 65 %
Platelets: 326 10*3/uL (ref 150–450)
RBC: 4.42 x10E6/uL (ref 3.77–5.28)
RDW: 12.4 % (ref 11.7–15.4)
WBC: 7.1 10*3/uL (ref 3.4–10.8)

## 2022-03-17 LAB — COMPREHENSIVE METABOLIC PANEL
ALT: 12 IU/L (ref 0–32)
AST: 18 IU/L (ref 0–40)
Albumin/Globulin Ratio: 1.9 (ref 1.2–2.2)
Albumin: 4.6 g/dL (ref 3.8–4.9)
Alkaline Phosphatase: 113 IU/L (ref 44–121)
BUN/Creatinine Ratio: 18 (ref 9–23)
BUN: 17 mg/dL (ref 6–24)
Bilirubin Total: 0.3 mg/dL (ref 0.0–1.2)
CO2: 22 mmol/L (ref 20–29)
Calcium: 9.8 mg/dL (ref 8.7–10.2)
Chloride: 105 mmol/L (ref 96–106)
Creatinine, Ser: 0.94 mg/dL (ref 0.57–1.00)
Globulin, Total: 2.4 g/dL (ref 1.5–4.5)
Glucose: 106 mg/dL — ABNORMAL HIGH (ref 70–99)
Potassium: 4.6 mmol/L (ref 3.5–5.2)
Sodium: 143 mmol/L (ref 134–144)
Total Protein: 7 g/dL (ref 6.0–8.5)
eGFR: 73 mL/min/{1.73_m2} (ref >59)

## 2022-03-17 LAB — TSH: TSH: 1.99 u[IU]/mL (ref 0.450–4.500)

## 2022-03-17 LAB — LIPID PANEL
Chol/HDL Ratio: 2.9 ratio (ref 0.0–4.4)
Cholesterol, Total: 174 mg/dL (ref 100–199)
HDL: 60 mg/dL (ref >39)
LDL Chol Calc (NIH): 104 mg/dL — ABNORMAL HIGH (ref 0–99)
Triglycerides: 51 mg/dL (ref 0–149)
VLDL Cholesterol Cal: 10 mg/dL (ref 5–40)

## 2022-03-17 LAB — VITAMIN B12: Vitamin B-12: 313 pg/mL (ref 232–1245)

## 2022-04-05 ENCOUNTER — Encounter: Payer: Self-pay | Admitting: Family Medicine

## 2022-04-05 ENCOUNTER — Telehealth (INDEPENDENT_AMBULATORY_CARE_PROVIDER_SITE_OTHER): Payer: PRIVATE HEALTH INSURANCE | Admitting: Family Medicine

## 2022-04-05 VITALS — Temp 98.0°F | Ht 66.0 in | Wt 193.0 lb

## 2022-04-05 DIAGNOSIS — J019 Acute sinusitis, unspecified: Secondary | ICD-10-CM

## 2022-04-05 MED ORDER — AMOXICILLIN-POT CLAVULANATE 875-125 MG PO TABS: 1.0000 | ORAL_TABLET | Freq: Two times a day (BID) | ORAL | 0 refills | Status: DC

## 2022-04-05 NOTE — Progress Notes (Signed)
   Subjective:    Patient ID: Waynetta Sandy, female    DOB: May 26, 1970, 52 y.o.   MRN: [id number]  HPI Documentation for virtual audio and video telecommunications through [place] encounter: The patient was located at home. 2 patient identifiers used.  The provider was located in the office. The patient did consent to this visit and is aware of possible charges through their insurance for this visit. The other persons participating in this telemedicine service were none. Time spent on call was 5 minutes and in review of previous records >15 minutes total for counseling and coordination of care. This virtual service is not related to other E/M service within previous 7 days.  She states that she has a 3-week history of difficulty with sinus congestion, headache, and purulent nasal and postnasal drainage.  She did use a telemetry doc appointment and was given Ceftin 300 twice per day and states she has not had any benefit from this.  She finished this several days ago.  Review of Systems     Objective:   Physical Exam Alert and in no distress but toxic-appearing.       Assessment & Plan:  Acute sinusitis with symptoms > 10 days - Plan: amoxicillin-clavulanate (AUGMENTIN) 875-125 MG tablet She will call at the end of 10 days if not totally back to normal and I will give her more medication.  She was comfortable with that.

## 2022-04-07 ENCOUNTER — Encounter: Payer: Self-pay | Admitting: Orthopaedic Surgery

## 2022-04-12 ENCOUNTER — Encounter: Payer: Self-pay | Admitting: Orthopaedic Surgery

## 2022-04-12 ENCOUNTER — Ambulatory Visit: Payer: PRIVATE HEALTH INSURANCE | Admitting: Orthopaedic Surgery

## 2022-04-12 VITALS — BP 163/89 | HR 96 | Ht 66.5 in | Wt 193.0 lb

## 2022-04-12 DIAGNOSIS — M2241 Chondromalacia patellae, right knee: Secondary | ICD-10-CM | POA: Diagnosis not present

## 2022-04-12 NOTE — Progress Notes (Signed)
Office Visit Note   Patient: Yolanda Weaver           Date of Birth: [birth year]           MRN: [id number] Visit Date: 04/12/2022              Requested by: Denita Lung, MD [place],  [place] [address] PCP: Denita Lung, MD   Assessment & Plan: Visit Diagnoses:  1. Chondromalacia patellae, right knee   2.      Possible torn medial meniscus, right knee.  Plan: Due to patient's persistent symptoms with sharp pain she would like proceed with MRI scan of her knee she has had anti-inflammatories intra-articular cortisone injection and has been through therapy.  Her therapist told her she probably has a meniscus that is torn.  She can return to me for MRI review.  Follow-Up Instructions: No follow-ups on file.   Orders:  Orders Placed This Encounter  Procedures   MR Knee Right w/o contrast   No orders of the defined types were placed in this encounter.     Procedures: No procedures performed   Clinical Data: No additional findings.   Subjective: Chief Complaint  Patient presents with   Right Knee - Pain, Follow-up    HPI 52 year old female returns with ongoing problems with right knee pain.  She has had sharp pain in her knee that grabs her she has not had true locking but states the therapist told her she likely has a meniscal tear and had recommended returning to see me about MRI scan.  She has had previous injection done in her right knee few months ago without relief.  Past history of knee arthroscopy with partial meniscectomy many years ago.  Review of Systems all other systems are noncontributory HPI.  No history of rheumatologic conditions.  She has had lumbar and cervical fusion done by Dr. Ronnald Ramp.   Objective: Vital Signs: BP (!) 163/89   Pulse 96   Ht 5' 6.5" (1.689 m)   Wt 193 lb (87.5 kg)   BMI 30.68 kg/m   Physical Exam Constitutional:      Appearance: She is well-developed.  HENT:     Head: Normocephalic.     Right Ear:  External ear normal.     Left Ear: External ear normal. There is no impacted cerumen.  Eyes:     Pupils: Pupils are equal, round, and reactive to light.  Neck:     Thyroid: No thyromegaly.     Trachea: No tracheal deviation.  Cardiovascular:     Rate and Rhythm: Normal rate.  Pulmonary:     Effort: Pulmonary effort is normal.  Abdominal:     Palpations: Abdomen is soft.  Musculoskeletal:     Cervical back: No rigidity.  Skin:    General: Skin is warm and dry.  Neurological:     Mental Status: She is alert and oriented to person, place, and time.  Psychiatric:        Behavior: Behavior normal.     Ortho Exam patient has medial joint line tenderness tender medial plica.  Positive patellar grind test.  Palpable crepitus with knee extension.  Q angle is normal.  ACL PCL exam is normal.  There is 2+ knee effusion.  Specialty Comments:  No specialty comments available.  Imaging: No results found.   PMFS History: Patient Active Problem List   Diagnosis Date Noted   Hot flashes 03/16/2022   Chondromalacia patellae, right  knee 02/22/2022   Essential hypertension 09/22/2021   Vitamin B12 deficiency 09/22/2021   S/P cervical spinal fusion 08/31/2016   S/P lumbar spinal fusion 01/15/2015   Hypothyroid 07/01/2010   Migraine headache 07/01/2010   Bipolar disorder ([place]) 07/01/2010   Allergic rhinitis 07/01/2010   Past Medical History:  Diagnosis Date   Acne    Allergy    Anxiety    Bipolar 1 disorder ([place])    Blood transfusion    h/o tx for rh incompatibility during pregnancy. see note re significant antibody   Contraception    Depression    Hypertension    Hypothyroid    Mental disorder    bipolar   Migraine    Obesity    PONV (postoperative nausea and vomiting)    Ptosis, right eyelid    Recurrent upper respiratory infection (URI)    reports she had bronchitis, treated /w antibiotics in past month    Family History  Problem Relation Age of Onset   COPD Father     Cancer Other    Hypertension Other    Diabetes Other    Anesthesia problems Neg Hx    Hypotension Neg Hx    Malignant hyperthermia Neg Hx    Pseudochol deficiency Neg Hx     Past Surgical History:  Procedure Laterality Date   DIAGNOSTIC LAPAROSCOPY     evaluation for endometriosis   FOOT FOREIGN BODY REMOVAL     KNEE ARTHROSCOPY     x3 r   LUMBAR FUSION  01/15/2015   LUMBAR LAMINECTOMY  04/08/2011   Procedure: MICRODISCECTOMY LUMBAR LAMINECTOMY;  Surgeon: Marybelle Killings, MD;  Location: [facility];  Service: Orthopedics;  Laterality: Right;  Right L5-S1 Microdiscectomy   MAXIMUM ACCESS (MAS)POSTERIOR LUMBAR INTERBODY FUSION (PLIF) 1 LEVEL N/A 01/15/2015   Procedure: LUMBAR FIVE-SACRA L-ONE MAXIMUM ACCESS SURGERY POSTERIOR LUMBAR INTERBODY FUSION ;  Surgeon: Eustace Moore, MD;  Location: [facility];  Service: Neurosurgery;  Laterality: N/A;   THYROIDECTOMY, PARTIAL     right lobe hurthle cell adenoma   Social History   Occupational History   Not on file  Tobacco Use   Smoking status: Every Day    Years: 17.00    Types: Cigarettes    Start date: 2003   Smokeless tobacco: Never   Tobacco comments:    stopped for a while  Vaping Use   Vaping Use: Never used  Substance and Sexual Activity   Alcohol use: Yes    Comment: occ   Drug use: No   Sexual activity: Not on file

## 2022-04-18 ENCOUNTER — Encounter: Payer: Self-pay | Admitting: Orthopaedic Surgery

## 2022-05-02 DIAGNOSIS — N951 Menopausal and female climacteric states: Secondary | ICD-10-CM | POA: Insufficient documentation

## 2022-05-02 DIAGNOSIS — N952 Postmenopausal atrophic vaginitis: Secondary | ICD-10-CM | POA: Insufficient documentation

## 2022-05-06 ENCOUNTER — Encounter: Payer: Self-pay | Admitting: Orthopaedic Surgery

## 2022-05-09 ENCOUNTER — Encounter: Payer: Self-pay | Admitting: Family Medicine

## 2022-05-10 ENCOUNTER — Telehealth (INDEPENDENT_AMBULATORY_CARE_PROVIDER_SITE_OTHER): Payer: PRIVATE HEALTH INSURANCE | Admitting: Family Medicine

## 2022-05-10 ENCOUNTER — Encounter: Payer: Self-pay | Admitting: Family Medicine

## 2022-05-10 DIAGNOSIS — R232 Flushing: Secondary | ICD-10-CM | POA: Diagnosis not present

## 2022-05-10 DIAGNOSIS — G8929 Other chronic pain: Secondary | ICD-10-CM

## 2022-05-10 DIAGNOSIS — N951 Menopausal and female climacteric states: Secondary | ICD-10-CM

## 2022-05-10 DIAGNOSIS — F3170 Bipolar disorder, currently in remission, most recent episode unspecified: Secondary | ICD-10-CM

## 2022-05-10 DIAGNOSIS — F17200 Nicotine dependence, unspecified, uncomplicated: Secondary | ICD-10-CM

## 2022-05-10 DIAGNOSIS — M25561 Pain in right knee: Secondary | ICD-10-CM

## 2022-05-10 DIAGNOSIS — Z6830 Body mass index (BMI) 30.0-30.9, adult: Secondary | ICD-10-CM

## 2022-05-10 DIAGNOSIS — E669 Obesity, unspecified: Secondary | ICD-10-CM

## 2022-05-10 NOTE — Progress Notes (Signed)
   Subjective:    Patient ID: Yolanda Weaver, female    DOB: Oct 20, 1970, 52 y.o.   MRN: [id number]  HPI Documentation for virtual audio and video telecommunications through [facility] encounter:  The patient was located at home. 2 patient identifiers used.  The provider was located in the office. The patient did consent to this visit and is aware of possible charges through their insurance for this visit. The other persons participating in this telemedicine service were none. Time spent on call was 5 minutes and in review of previous records >30 minutes total for counseling and coordination of care. This virtual service is not related to other E/M service within previous 7 days.  She has multiple issues to discuss today.  She has been using the naltrexone given to her by another physician to help with weight loss.  She did lose roughly 40 pounds but has gained 20 of this back in the last several months.  She recently saw her gynecologist and she is now taking Premarin to help with her hot flashes.  There is concern over this medicine the interfering with the efficacy of her Lamictal.  Review of the record indicates her previous doses were in the adequate range.  She also has concerns about quitting smoking.  She smokes roughly half a pack per day and this is usually revolving around stress.  She also is being evaluated for her right knee pain and a recent MRI did show extensive involvement of her meniscus as well as some degenerative joint changes.  Review of Systems     Objective:   Physical Exam Alert and in no distress otherwise not examined       Assessment & Plan:  Hot flashes      I explained that we will need to monitor the hot flashes as to the efficacy and she will continue to be followed by gynecology. Bipolar affective disorder in remission       Discussed the possibility of having to readjust her Lamictal.  She will keep me informed as to if she notices any difference in her  mood and will also discussed this with her husband and children.  We will readjust accordingly. Menopausal syndrome  Obesity (BMI 30-39.9)    She is to check with her insurance company as to whether or cover any of the GLP-1 medications and if so I will certainly put her on these. Smoker     At this point no intervention needed other than recognizing on her part that she is smoking mainly for stress reduction. Chronic pain of right knee     We discussed at length the fact that the knee is interfering with her ADLs keeping her from functioning.  She does have difficulty with pain medications being allergic to several of them and NSAIDs are the only thing that can be used.  She will probably go ahead and get the surgery done as quick as possible as it it is interfering with her ADLs. Over 30 minutes spent discussing all these issues with her.

## 2022-05-16 ENCOUNTER — Encounter: Payer: Self-pay | Admitting: Orthopaedic Surgery

## 2022-05-16 ENCOUNTER — Ambulatory Visit (INDEPENDENT_AMBULATORY_CARE_PROVIDER_SITE_OTHER): Payer: PRIVATE HEALTH INSURANCE | Admitting: Orthopaedic Surgery

## 2022-05-16 VITALS — BP 168/114 | Ht 66.5 in | Wt 193.0 lb

## 2022-05-16 DIAGNOSIS — S83206A Unspecified tear of unspecified meniscus, current injury, right knee, initial encounter: Secondary | ICD-10-CM | POA: Insufficient documentation

## 2022-05-16 DIAGNOSIS — M23200 Derangement of unspecified lateral meniscus due to old tear or injury, right knee: Secondary | ICD-10-CM

## 2022-05-16 NOTE — Progress Notes (Signed)
Office Visit Note   Patient: Yolanda Weaver           Date of Birth: 1970/10/31           MRN: [id number] Visit Date: 05/16/2022              Requested by: Ronnald NianLalonde, John C, MD [address] [place] [id number] PCP: Ronnald NianLalonde, John C, MD   Assessment & Plan: Visit Diagnoses:  1. Old complex tear of lateral meniscus of right knee     Plan: Patient has posterior meniscal tear as well as lateral complex meniscal tear with repetitive catching failed intra-articular injections anti-inflammatories physical therapy with persistent catching symptoms.  She can call when she would like to proceed with outpatient arthroscopy with partial meniscectomy.  Reviewed MRI scan report and images.  Questions elicited and answered.  This to be an outpatient procedure with crutches as needed postop.  Risks of surgery discussed including anesthetic risk.  She understands request to proceed.  Follow-Up Instructions: No follow-ups on file.   Orders:  No orders of the defined types were placed in this encounter.  No orders of the defined types were placed in this encounter.     Procedures: No procedures performed   Clinical Data: No additional findings.   Subjective: Chief Complaint  Patient presents with   Right Knee - Pain, Follow-up    MRI review    HPI 52 year old female seen with right knee pain catching difficulty walking failed anti-inflammatories and intra-articular injections.  She has had pain since last June.  Patient's [facility] brought with a disc for review which shows medial and lateral meniscal tears with lateral complex and medial side posterior meniscus.  States she is having trouble walking.  She is an Buyer, retailaccountant prepares taxes and states she knows she needs some knee arthroscopy for repetitive pain and locking of her knee but cannot do it until after tax season.  Her pain is minimal intermittent and sharp.  When it severe she states  she is having great difficulty walking.  She has been through therapy and therapist told her she had a meniscal tear likely.  No history of rheumatologic conditions.  She has had lumbar and cervical fusions in the past positive history for bipolar disorder and also hypertension currently elevated.  Review of Systems negative for stroke or MI no history of seizures.  All the systems noncontributory to HPI.   Objective: Vital Signs: BP (!) 168/114 (BP Location: Right Arm)   Ht 5' 6.5" (1.689 m)   Wt 193 lb (87.5 kg)   BMI 30.68 kg/m   Physical Exam Constitutional:      Appearance: She is well-developed.  HENT:     Head: Normocephalic.     Right Ear: External ear normal.     Left Ear: External ear normal. There is no impacted cerumen.  Eyes:     Pupils: Pupils are equal, round, and reactive to light.  Neck:     Thyroid: No thyromegaly.     Trachea: No tracheal deviation.  Cardiovascular:     Rate and Rhythm: Normal rate.  Pulmonary:     Effort: Pulmonary effort is normal.  Abdominal:     Palpations: Abdomen is soft.  Musculoskeletal:     Cervical back: No rigidity.  Skin:    General: Skin is warm and dry.  Neurological:     Mental Status: She is alert and oriented to person, place, and  time.  Psychiatric:        Behavior: Behavior normal.     Ortho Exam patient with the knee pain with hyperextension medial and lateral joint line tenderness.  She is amatory with the knee limp.  2+ knee effusion.  Negative logroll hips distal pulses are palpable.  Previous arthroscopic portals are noted medial lateral joint line.  Normal patellar tracking.  Negative pivot shift test.  Baker's cyst palpable small to medium size.  Knee and ankle jerk are intact.  Specialty Comments:  No specialty comments available.  Imaging: arrative  MRI RIGHT KNEE WITHOUT CONTRAST, 05/03/2022 6:23 PM  INDICATION: Chondromalacia patellae, right knee \ M22.41 Chondromalacia patellae, right  knee COMPARISON: None. Additional history: Right knee pain, which is sharp, no locking sensation, concern for meniscal tear. History of partial meniscectomy.  TECHNIQUE: Multi-planar, multi-sequence MR imaging of the right knee was performed without contrast.  FINDINGS:  .  Bone: No fracture, avascular necrosis, or marrow replacement. .  Cruciate ligaments: Intact. Mucoid degeneration of the ACL extending to the anterior horn of the lateral meniscus. .  Collateral ligaments: Intact. .  Medial meniscus: Horizontal undersurface tear of the posterior horn of the medial meniscus with tiny nondisplaced undersurface flap. .  Lateral meniscus: Complex tear with horizontal undersurface component extending through all segments of the meniscus, a free edge radial component, and a flap in the mid to anterior body displaced superiorly, where a peripheral marginal osteophyte of the weightbearing lateral femoral condyle extends between the meniscal fragments (series 109, image 14) with mild peripheral extrusion. In the anterior horn, the tear transitions to a vertical longitudinal component in the middle third. .  Medial compartment: High-grade chondrosis with focal full-thickness cartilage loss of the weightbearing surface of the condyle with associated subchondral edema. Intermediate grade chondrosis of the weightbearing surface of the medial tibial plateau. Other areas of low-grade chondrosis. .  Lateral compartment: High-grade chondrosis with focal full-thickness cartilage loss of the posterior weightbearing surfaces of the lateral femoral condyle and lateral tibial plateau continuing to the inferior flexor surface with other areas of low-grade chondrosis. High-grade chondrosis of the lateral surface of the lateral tibial spine. .  Patellofemoral compartment: High-grade chondrosis with focal full-thickness cartilage loss of the median patellar facet and upper medial trochlear ridge near the groove with 2 mm  chondral delamination extending into the trochlear groove. Intermediate chondrosis of the lateral patellar facet and lateral trochlea. .  Extensor mechanism: Intact. .  Joint: No malalignment. Small joint effusion with mild synovitis. .  Soft tissues: Moderate Baker cyst with minimal inferior leakage. Small amount of fluid in the semimembranosus and pes anserinus bursa. Partial longitudinal split tear of the semimembranosus with semimembranosus/MCL bursitis.   1.  Horizontal undersurface flap tear of the posterior horn of the medial meniscus. 2.  Complex tear of the lateral meniscus tear with horizontal undersurface component extending through all segments of the meniscus, a free edge radial tear component in the body, and a flap in the mid to anterior body displaced superiorly into the meniscofemoral recess. 3.  Mucoid degeneration of the ACL extending to the anterior horn of the lateral meniscus. 4.  Partial longitudinal split tear of the semimembranosus with semimembranosus/MCL bursitis. 5.  Tricompartmental high grade chondrosis. 6.  Small joint effusion with moderate Baker cyst with minimal inferior leakage. Procedure Note  Salomon Fick, MD - 05/04/2022 Formatting of this note might be different from the original. MRI RIGHT KNEE WITHOUT CONTRAST, 05/03/2022 6:23 PM  INDICATION: Chondromalacia  patellae, right knee \ M22.41 Chondromalacia patellae, right knee COMPARISON: None. Additional history: Right knee pain, which is sharp, no locking sensation, concern for meniscal tear. History of partial meniscectomy.  TECHNIQUE: Multi-planar, multi-sequence MR imaging of the right knee was performed without contrast.  FINDINGS:  .  Bone: No fracture, avascular necrosis, or marrow replacement. .  Cruciate ligaments: Intact. Mucoid degeneration of the ACL extending to the anterior horn of the lateral meniscus. .  Collateral ligaments: Intact. .  Medial meniscus: Horizontal undersurface tear of  the posterior horn of the medial meniscus with tiny nondisplaced undersurface flap. .  Lateral meniscus: Complex tear with horizontal undersurface component extending through all segments of the meniscus, a free edge radial component, and a flap in the mid to anterior body displaced superiorly, where a peripheral marginal osteophyte of the weightbearing lateral femoral condyle extends between the meniscal fragments (series 109, image 14) with mild peripheral extrusion. In the anterior horn, the tear transitions to a vertical longitudinal component in the middle third. .  Medial compartment: High-grade chondrosis with focal full-thickness cartilage loss of the weightbearing surface of the condyle with associated subchondral edema. Intermediate grade chondrosis of the weightbearing surface of the medial tibial plateau. Other areas of low-grade chondrosis. .  Lateral compartment: High-grade chondrosis with focal full-thickness cartilage loss of the posterior weightbearing surfaces of the lateral femoral condyle and lateral tibial plateau continuing to the inferior flexor surface with other areas of low-grade chondrosis. High-grade chondrosis of the lateral surface of the lateral tibial spine. .  Patellofemoral compartment: High-grade chondrosis with focal full-thickness cartilage loss of the median patellar facet and upper medial trochlear ridge near the groove with 2 mm chondral delamination extending into the trochlear groove. Intermediate chondrosis of the lateral patellar facet and lateral trochlea. .  Extensor mechanism: Intact. .  Joint: No malalignment. Small joint effusion with mild synovitis. .  Soft tissues: Moderate Baker cyst with minimal inferior leakage. Small amount of fluid in the semimembranosus and pes anserinus bursa. Partial longitudinal split tear of the semimembranosus with semimembranosus/MCL bursitis.   1.  Horizontal undersurface flap tear of the posterior horn of the medial  meniscus. 2.  Complex tear of the lateral meniscus tear with horizontal undersurface component extending through all segments of the meniscus, a free edge radial tear component in the body, and a flap in the mid to anterior body displaced superiorly into the meniscofemoral recess. 3.  Mucoid degeneration of the ACL extending to the anterior horn of the lateral meniscus. 4.  Partial longitudinal split tear of the semimembranosus with semimembranosus/MCL bursitis. 5.  Tricompartmental high grade chondrosis. 6.  Small joint effusion with moderate Baker cyst with minimal inferior leakage. Exam End: 05/03/22 18:23   Specimen Collected: 05/04/22 08:29 Last Resulted: 05/04/22 13:23     PMFS History: Patient Active Problem List   Diagnosis Date Noted   Right knee meniscal tear 05/16/2022   Smoker 05/10/2022   Obesity (BMI 30-39.9) 05/10/2022   Menopausal syndrome 05/02/2022   Atrophic vaginitis 05/02/2022   Hot flashes 03/16/2022   Chondromalacia patellae, right knee 02/22/2022   Essential hypertension 09/22/2021   Vitamin B12 deficiency 09/22/2021   S/P cervical spinal fusion 08/31/2016   S/P lumbar spinal fusion 01/15/2015   Hypothyroid 07/01/2010   Migraine headache 07/01/2010   Bipolar disorder 07/01/2010   Allergic rhinitis 07/01/2010   Past Medical History:  Diagnosis Date   Acne    Allergy    Anxiety    Bipolar 1 disorder  Blood transfusion    h/o tx for rh incompatibility during pregnancy. see note re significant antibody   Contraception    Depression    Hypertension    Hypothyroid    Mental disorder    bipolar   Migraine    Obesity    PONV (postoperative nausea and vomiting)    Ptosis, right eyelid    Recurrent upper respiratory infection (URI)    reports she had bronchitis, treated /w antibiotics in past month    Family History  Problem Relation Age of Onset   COPD Father    Cancer Other    Hypertension Other    Diabetes Other    Anesthesia problems Neg Hx     Hypotension Neg Hx    Malignant hyperthermia Neg Hx    Pseudochol deficiency Neg Hx     Past Surgical History:  Procedure Laterality Date   DIAGNOSTIC LAPAROSCOPY     evaluation for endometriosis   FOOT FOREIGN BODY REMOVAL     KNEE ARTHROSCOPY     x3 r   LUMBAR FUSION  01/15/2015   LUMBAR LAMINECTOMY  04/08/2011   Procedure: MICRODISCECTOMY LUMBAR LAMINECTOMY;  Surgeon: Eldred Manges, MD;  Location: [facility] OR;  Service: Orthopedics;  Laterality: Right;  Right L5-S1 Microdiscectomy   MAXIMUM ACCESS (MAS)POSTERIOR LUMBAR INTERBODY FUSION (PLIF) 1 LEVEL N/A 01/15/2015   Procedure: LUMBAR FIVE-SACRA L-ONE MAXIMUM ACCESS SURGERY POSTERIOR LUMBAR INTERBODY FUSION ;  Surgeon: Tia Alert, MD;  Location: [facility];  Service: Neurosurgery;  Laterality: N/A;   NECK SURGERY  07/21/2016   fusion of C4 an C5   THYROIDECTOMY, PARTIAL     right lobe hurthle cell adenoma   Social History   Occupational History   Not on file  Tobacco Use   Smoking status: Every Day    Years: 17    Types: Cigarettes    Start date: 2003   Smokeless tobacco: Never   Tobacco comments:    varies  Vaping Use   Vaping Use: Never used  Substance and Sexual Activity   Alcohol use: Yes    Comment: occ   Drug use: No   Sexual activity: Not on file

## 2022-05-20 ENCOUNTER — Ambulatory Visit: Payer: PRIVATE HEALTH INSURANCE | Admitting: Orthopaedic Surgery

## 2022-05-26 ENCOUNTER — Other Ambulatory Visit: Payer: Self-pay | Admitting: Family Medicine

## 2022-05-26 DIAGNOSIS — E669 Obesity, unspecified: Secondary | ICD-10-CM

## 2022-05-26 MED ORDER — WEGOVY 0.25 MG/0.5ML ~~LOC~~ SOAJ: 0.2500 mg | SUBCUTANEOUS | 0 refills | Status: DC

## 2022-05-28 ENCOUNTER — Other Ambulatory Visit: Payer: Self-pay | Admitting: Family Medicine

## 2022-05-28 DIAGNOSIS — E669 Obesity, unspecified: Secondary | ICD-10-CM

## 2022-05-30 NOTE — Telephone Encounter (Signed)
Sent to [employer] [facility]

## 2022-06-01 NOTE — Telephone Encounter (Signed)
Per pharmacy rx request note, Reginal Lutes isn't covered by the pt's insurance. Greggory Keen is covered with a PA. If you feel this change is appropriate, please send the new rx for [facility] and I will complete the PA.

## 2022-06-03 ENCOUNTER — Telehealth: Payer: Self-pay

## 2022-06-03 NOTE — Telephone Encounter (Signed)
PA has been denied as this medication is not a covered benefit of the pt's insurance plan, per CMM.

## 2022-06-27 ENCOUNTER — Other Ambulatory Visit: Payer: Self-pay | Admitting: Orthopaedic Surgery

## 2022-06-27 DIAGNOSIS — M23211 Derangement of anterior horn of medial meniscus due to old tear or injury, right knee: Secondary | ICD-10-CM | POA: Diagnosis not present

## 2022-06-27 DIAGNOSIS — M23241 Derangement of anterior horn of lateral meniscus due to old tear or injury, right knee: Secondary | ICD-10-CM

## 2022-06-27 MED ORDER — IBUPROFEN 800 MG PO TABS: ORAL_TABLET | ORAL | 2 refills | Status: DC

## 2022-07-05 ENCOUNTER — Ambulatory Visit (INDEPENDENT_AMBULATORY_CARE_PROVIDER_SITE_OTHER): Payer: PRIVATE HEALTH INSURANCE | Admitting: Orthopaedic Surgery

## 2022-07-05 ENCOUNTER — Encounter: Payer: Self-pay | Admitting: Orthopaedic Surgery

## 2022-07-05 VITALS — BP 131/86 | HR 84 | Ht 66.5 in | Wt 193.0 lb

## 2022-07-05 DIAGNOSIS — M23200 Derangement of unspecified lateral meniscus due to old tear or injury, right knee: Secondary | ICD-10-CM

## 2022-07-05 NOTE — Progress Notes (Signed)
Post-Op Visit Note   Patient: Yolanda Weaver           Date of Birth: [birth year]           MRN: [id number] Visit Date: 07/05/2022 PCP: Ronnald Nian, MD   Assessment & Plan: Follow-up partial lateral meniscectomy for small anterior bucket-handle tear of the lateral meniscus that extended 50% around.  No catching Steri-Strips changed mild swelling in the knee she can progressively weight-bear with her crutches return 4 weeks.  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  Chief Complaint:  Chief Complaint  Patient presents with   Right Knee - Routine Post Op    06/27/2022 right knee arthroscopy, PMM,PLM   Visit Diagnoses:  1. Old complex tear of lateral meniscus of right knee     Plan: Recheck 4 weeks continue leg lifts gradual knee flexion weightbearing as tolerated.  Follow-Up Instructions: No follow-ups on file.   Orders:  No orders of the defined types were placed in this encounter.  No orders of the defined types were placed in this encounter.   Imaging: No results found.  PMFS History: Patient Active Problem List   Diagnosis Date Noted   Right knee meniscal tear 05/16/2022   Smoker 05/10/2022   Obesity (BMI 30-39.9) 05/10/2022   Menopausal syndrome 05/02/2022   Atrophic vaginitis 05/02/2022   Hot flashes 03/16/2022   Chondromalacia patellae, right knee 02/22/2022   Essential hypertension 09/22/2021   Vitamin B12 deficiency 09/22/2021   S/P cervical spinal fusion 08/31/2016   S/P lumbar spinal fusion 01/15/2015   Hypothyroid 07/01/2010   Migraine headache 07/01/2010   Bipolar disorder (HCC) 07/01/2010   Allergic rhinitis 07/01/2010   Past Medical History:  Diagnosis Date   Acne    Allergy    Anxiety    Bipolar 1 disorder (HCC)    Blood transfusion    h/o tx for rh incompatibility during pregnancy. see note re significant antibody    Contraception    Depression    Hypertension    Hypothyroid    Mental disorder    bipolar   Migraine    Obesity    PONV (postoperative nausea and vomiting)    Ptosis, right eyelid    Recurrent upper respiratory infection (URI)    reports she had bronchitis, treated /w antibiotics in past month    Family History  Problem Relation Age of Onset   COPD Father    Cancer Other    Hypertension Other    Diabetes Other    Anesthesia problems Neg Hx    Hypotension Neg Hx    Malignant hyperthermia Neg Hx    Pseudochol deficiency Neg Hx     Past Surgical History:  Procedure Laterality Date   DIAGNOSTIC LAPAROSCOPY     evaluation for endometriosis   FOOT FOREIGN BODY REMOVAL     KNEE ARTHROSCOPY     x3 r   LUMBAR FUSION  01/15/2015   LUMBAR LAMINECTOMY  04/08/2011   Procedure: MICRODISCECTOMY LUMBAR LAMINECTOMY;  Surgeon: Eldred Manges, MD;  Location: [facility] OR;  Service: Orthopedics;  Laterality: Right;  Right L5-S1 Microdiscectomy   MAXIMUM ACCESS (MAS)POSTERIOR LUMBAR INTERBODY FUSION (PLIF) 1 LEVEL N/A 01/15/2015   Procedure: LUMBAR FIVE-SACRA L-ONE MAXIMUM ACCESS SURGERY POSTERIOR LUMBAR INTERBODY FUSION ;  Surgeon: Tia Alert, MD;  Location: [facility];  Service: Neurosurgery;  Laterality: N/A;  NECK SURGERY  07/21/2016   fusion of C4 an C5   THYROIDECTOMY, PARTIAL     right lobe hurthle cell adenoma   Social History   Occupational History   Not on file  Tobacco Use   Smoking status: Every Day    Years: 17    Types: Cigarettes    Start date: 2003   Smokeless tobacco: Never   Tobacco comments:    varies  Vaping Use   Vaping Use: Never used  Substance and Sexual Activity   Alcohol use: Yes    Comment: occ   Drug use: No   Sexual activity: Not on file

## 2022-07-18 ENCOUNTER — Encounter: Payer: Self-pay | Admitting: Orthopaedic Surgery

## 2022-07-23 ENCOUNTER — Other Ambulatory Visit: Payer: Self-pay | Admitting: Family Medicine

## 2022-07-23 DIAGNOSIS — I10 Essential (primary) hypertension: Secondary | ICD-10-CM

## 2022-08-01 ENCOUNTER — Encounter: Payer: Self-pay | Admitting: Family Medicine

## 2022-08-01 DIAGNOSIS — I10 Essential (primary) hypertension: Secondary | ICD-10-CM

## 2022-08-01 MED ORDER — LISINOPRIL-HYDROCHLOROTHIAZIDE 10-12.5 MG PO TABS: ORAL_TABLET | ORAL | 1 refills | Status: DC

## 2022-08-02 ENCOUNTER — Ambulatory Visit (INDEPENDENT_AMBULATORY_CARE_PROVIDER_SITE_OTHER): Payer: PRIVATE HEALTH INSURANCE | Admitting: Orthopaedic Surgery

## 2022-08-02 ENCOUNTER — Encounter: Payer: Self-pay | Admitting: Orthopaedic Surgery

## 2022-08-02 VITALS — BP 133/82 | HR 103 | Ht 66.5 in | Wt 205.0 lb

## 2022-08-02 DIAGNOSIS — M23200 Derangement of unspecified lateral meniscus due to old tear or injury, right knee: Secondary | ICD-10-CM

## 2022-08-02 NOTE — Progress Notes (Signed)
Post-Op Visit Note   Patient: Yolanda Weaver           Date of Birth: [birth year]           MRN: [id number] Visit Date: 08/02/2022 PCP: Ronnald Nian, MD   Assessment & Plan: Post knee arthroscopy with medial meniscal tear as well as complex lateral meniscal tear.  She has some crepitus knee range of motion swelling is down.  She is walking better and is getting ready to progress back to the gym.  Chief Complaint:  Chief Complaint  Patient presents with   Right Knee - Routine Post Op, Follow-up    06/27/2022 right knee arthroscopy, PMM, PLM   Visit Diagnoses:  1. Old complex tear of lateral meniscus of right knee     Plan: Recheck for final visit 1 month.  She is happy with the results of surgery.  Follow-Up Instructions: Return in about 1 month (around 09/01/2022).   Orders:  No orders of the defined types were placed in this encounter.  No orders of the defined types were placed in this encounter.   Imaging: No results found.  PMFS History: Patient Active Problem List   Diagnosis Date Noted   Right knee meniscal tear 05/16/2022   Smoker 05/10/2022   Obesity (BMI 30-39.9) 05/10/2022   Menopausal syndrome 05/02/2022   Atrophic vaginitis 05/02/2022   Hot flashes 03/16/2022   Chondromalacia patellae, right knee 02/22/2022   Essential hypertension 09/22/2021   Vitamin B12 deficiency 09/22/2021   S/P cervical spinal fusion 08/31/2016   S/P lumbar spinal fusion 01/15/2015   Hypothyroid 07/01/2010   Migraine headache 07/01/2010   Bipolar disorder (HCC) 07/01/2010   Allergic rhinitis 07/01/2010   Past Medical History:  Diagnosis Date   Acne    Allergy    Anxiety    Bipolar 1 disorder (HCC)    Blood transfusion    h/o tx for rh incompatibility during pregnancy. see note re significant antibody   Contraception    Depression    Hypertension    Hypothyroid    Mental disorder    bipolar   Migraine    Obesity    PONV (postoperative nausea and vomiting)     Ptosis, right eyelid    Recurrent upper respiratory infection (URI)    reports she had bronchitis, treated /w antibiotics in past month    Family History  Problem Relation Age of Onset   COPD Father    Cancer Other    Hypertension Other    Diabetes Other    Anesthesia problems Neg Hx    Hypotension Neg Hx    Malignant hyperthermia Neg Hx    Pseudochol deficiency Neg Hx     Past Surgical History:  Procedure Laterality Date   DIAGNOSTIC LAPAROSCOPY     evaluation for endometriosis   FOOT FOREIGN BODY REMOVAL     KNEE ARTHROSCOPY     x3 r   LUMBAR FUSION  01/15/2015   LUMBAR LAMINECTOMY  04/08/2011   Procedure: MICRODISCECTOMY LUMBAR LAMINECTOMY;  Surgeon: Eldred Manges, MD;  Location: [facility];  Service: Orthopedics;  Laterality: Right;  Right L5-S1 Microdiscectomy   MAXIMUM ACCESS (MAS)POSTERIOR LUMBAR INTERBODY FUSION (PLIF) 1 LEVEL N/A 01/15/2015   Procedure: LUMBAR FIVE-SACRA L-ONE MAXIMUM ACCESS SURGERY POSTERIOR LUMBAR INTERBODY FUSION ;  Surgeon: Tia Alert, MD;  Location: [facility];  Service: Neurosurgery;  Laterality: N/A;   NECK SURGERY  07/21/2016   fusion of C4 an C5   THYROIDECTOMY,  PARTIAL     right lobe hurthle cell adenoma   Social History   Occupational History   Not on file  Tobacco Use   Smoking status: Every Day    Years: 17    Types: Cigarettes    Start date: 2003   Smokeless tobacco: Never   Tobacco comments:    varies  Vaping Use   Vaping Use: Never used  Substance and Sexual Activity   Alcohol use: Yes    Comment: occ   Drug use: No   Sexual activity: Not on file

## 2022-08-30 ENCOUNTER — Ambulatory Visit (INDEPENDENT_AMBULATORY_CARE_PROVIDER_SITE_OTHER): Payer: PRIVATE HEALTH INSURANCE | Admitting: Orthopaedic Surgery

## 2022-08-30 ENCOUNTER — Encounter: Payer: Self-pay | Admitting: Orthopaedic Surgery

## 2022-08-30 VITALS — BP 137/72 | HR 70 | Ht 66.5 in | Wt 205.0 lb

## 2022-08-30 DIAGNOSIS — M2241 Chondromalacia patellae, right knee: Secondary | ICD-10-CM

## 2022-08-30 DIAGNOSIS — M23200 Derangement of unspecified lateral meniscus due to old tear or injury, right knee: Secondary | ICD-10-CM

## 2022-08-30 NOTE — Progress Notes (Signed)
Post-Op Visit Note   Patient: Yolanda Weaver           Date of Birth: [birth year]           MRN: [id number] Visit Date: 08/30/2022 PCP: Ronnald Nian, MD   Assessment & Plan: 52 year old female post knee arthroscopy with posterior medial meniscal tear and posterior lateral meniscal tear.  She is doing some squatting yesterday had some increased pain mild swelling little bit more pain medial than lateral.  She drove herself here today.  She is taken some ibuprofen but it tends to bother her stomach.  She is concerned she may have messed her knee up when she squatted.  Chief Complaint:  Chief Complaint  Patient presents with   Right Knee - Routine Post Op, Follow-up    06/27/2022 Right knee arthroscopy, PMM, PLM   Visit Diagnoses:  1. Chondromalacia patellae, right knee   2. Old complex tear of lateral meniscus of right knee     Plan: We discussed she can continue some ice topical Aspercreme or Voltaren gel, oral anti-inflammatories lungs does not bother her stomach too much.  She can return if she has increased symptoms.  We discussed posterior meniscal areas that are loaded more when she is in a squatting position.  She had been doing well but does not seem to have significant effusion today.  She can follow-up if she has ongoing problems.  Follow-Up Instructions: Return if symptoms worsen or fail to improve.   Orders:  No orders of the defined types were placed in this encounter.  No orders of the defined types were placed in this encounter.   Imaging: No results found.  PMFS History: Patient Active Problem List   Diagnosis Date Noted   Right knee meniscal tear 05/16/2022   Smoker 05/10/2022   Obesity (BMI 30-39.9) 05/10/2022   Menopausal syndrome 05/02/2022   Atrophic vaginitis 05/02/2022   Hot flashes 03/16/2022   Chondromalacia patellae, right knee 02/22/2022   Essential hypertension 09/22/2021   Vitamin B12 deficiency 09/22/2021   S/P cervical spinal fusion  08/31/2016   S/P lumbar spinal fusion 01/15/2015   Hypothyroid 07/01/2010   Migraine headache 07/01/2010   Bipolar disorder (HCC) 07/01/2010   Allergic rhinitis 07/01/2010   Past Medical History:  Diagnosis Date   Acne    Allergy    Anxiety    Bipolar 1 disorder (HCC)    Blood transfusion    h/o tx for rh incompatibility during pregnancy. see note re significant antibody   Contraception    Depression    Hypertension    Hypothyroid    Mental disorder    bipolar   Migraine    Obesity    PONV (postoperative nausea and vomiting)    Ptosis, right eyelid    Recurrent upper respiratory infection (URI)    reports she had bronchitis, treated /w antibiotics in past month    Family History  Problem Relation Age of Onset   COPD Father    Cancer Other    Hypertension Other    Diabetes Other    Anesthesia problems Neg Hx    Hypotension Neg Hx    Malignant hyperthermia Neg Hx    Pseudochol deficiency Neg Hx     Past Surgical History:  Procedure Laterality Date   DIAGNOSTIC LAPAROSCOPY     evaluation for endometriosis   FOOT FOREIGN BODY REMOVAL     KNEE ARTHROSCOPY     x3 r   LUMBAR FUSION  01/15/2015  LUMBAR LAMINECTOMY  04/08/2011   Procedure: MICRODISCECTOMY LUMBAR LAMINECTOMY;  Surgeon: Eldred Manges, MD;  Location: [facility] OR;  Service: Orthopedics;  Laterality: Right;  Right L5-S1 Microdiscectomy   MAXIMUM ACCESS (MAS)POSTERIOR LUMBAR INTERBODY FUSION (PLIF) 1 LEVEL N/A 01/15/2015   Procedure: LUMBAR FIVE-SACRA L-ONE MAXIMUM ACCESS SURGERY POSTERIOR LUMBAR INTERBODY FUSION ;  Surgeon: Tia Alert, MD;  Location: [facility];  Service: Neurosurgery;  Laterality: N/A;   NECK SURGERY  07/21/2016   fusion of C4 an C5   THYROIDECTOMY, PARTIAL     right lobe hurthle cell adenoma   Social History   Occupational History   Not on file  Tobacco Use   Smoking status: Every Day    Types: Cigarettes    Start date: 2003   Smokeless tobacco: Never   Tobacco comments:    varies   Vaping Use   Vaping status: Never Used  Substance and Sexual Activity   Alcohol use: Yes    Comment: occ   Drug use: No   Sexual activity: Not on file

## 2022-09-17 LAB — LAB REPORT - SCANNED
A1c: 5.5
Calcium: 9.3
EGFR: 84
Free T4: 1.27 ng/dL
PTH: 28
TSH: 2.63 (ref 0.41–5.90)

## 2022-11-08 ENCOUNTER — Encounter: Payer: Self-pay | Admitting: Family Medicine

## 2022-12-01 LAB — HM MAMMOGRAPHY

## 2022-12-01 LAB — HM PAP SMEAR: HM Pap smear: NEGATIVE

## 2022-12-01 LAB — RESULTS CONSOLE HPV: CHL HPV: NEGATIVE

## 2022-12-02 ENCOUNTER — Other Ambulatory Visit: Payer: Self-pay | Admitting: Neurological Surgery

## 2022-12-12 ENCOUNTER — Encounter: Payer: Self-pay | Admitting: Family Medicine

## 2022-12-22 NOTE — Pre-Procedure Instructions (Signed)
Surgical Instructions   Your procedure is scheduled on December 30, 2022. Report to [facility] Main Entrance "A" at 5:30 A.M., then check in with the Admitting office. Any questions or running late day of surgery: call [phone]  Questions prior to your surgery date: call [phone], Monday-Friday, 8am-4pm. If you experience any cold or flu symptoms such as cough, fever, chills, shortness of breath, etc. between now and your scheduled surgery, please notify us at the above number.     Remember:  Do not eat or drink after midnight the night before your surgery    Take these medicines the morning of surgery with A SIP OF WATER: NONE   May take these medicines IF NEEDED: albuterol (VENTOLIN HFA) inhaler - please bring inhaler with you morning of surgery   One week prior to surgery, STOP taking any Aspirin (unless otherwise instructed by your surgeon) Aleve, Naproxen, Ibuprofen, Motrin, Advil, Goody's, BC's, all herbal medications, fish oil, and non-prescription vitamins.                     Do NOT Smoke (Tobacco/Vaping) for 24 hours prior to your procedure.  If you use a CPAP at night, you may bring your mask/headgear for your overnight stay.   You will be asked to remove any contacts, glasses, piercing's, hearing aid's, dentures/partials prior to surgery. Please bring cases for these items if needed.    Patients discharged the day of surgery will not be allowed to drive home, and someone needs to stay with them for 24 hours.  SURGICAL WAITING ROOM VISITATION Patients may have no more than 2 support people in the waiting area - these visitors may rotate.   Pre-op nurse will coordinate an appropriate time for 1 ADULT support person, who may not rotate, to accompany patient in pre-op.  Children under the age of 56 must have an adult with them who is not the patient and must remain in the main waiting area with an adult.  If the patient needs to stay at the hospital during part  of their recovery, the visitor guidelines for inpatient rooms apply.  Please refer to the [facility] website for the visitor guidelines for any additional information.   If you received a COVID test during your pre-op visit  it is requested that you wear a mask when out in public, stay away from anyone that may not be feeling well and notify your surgeon if you develop symptoms. If you have been in contact with anyone that has tested positive in the last 10 days please notify you surgeon.      Pre-operative 5 CHG Bathing Instructions   You can play a key role in reducing the risk of infection after surgery. Your skin needs to be as free of germs as possible. You can reduce the number of germs on your skin by washing with CHG (chlorhexidine gluconate) soap before surgery. CHG is an antiseptic soap that kills germs and continues to kill germs even after washing.   DO NOT use if you have an allergy to chlorhexidine/CHG or antibacterial soaps. If your skin becomes reddened or irritated, stop using the CHG and notify one of our RNs at [phone].   Please shower with the CHG soap starting 4 days before surgery using the following schedule:     Please keep in mind the following:  DO NOT shave, including legs and underarms, starting the day of your first shower.   You may shave your face  at any point before/day of surgery.  Place clean sheets on your bed the day you start using CHG soap. Use a clean washcloth (not used since being washed) for each shower. DO NOT sleep with pets once you start using the CHG.   CHG Shower Instructions:  Wash your face and private area with normal soap. If you choose to wash your hair, wash first with your normal shampoo.  After you use shampoo/soap, rinse your hair and body thoroughly to remove shampoo/soap residue.  Turn the water OFF and apply about 3 tablespoons (45 ml) of CHG soap to a CLEAN washcloth.  Apply CHG soap ONLY FROM YOUR NECK DOWN TO YOUR  TOES (washing for 3-5 minutes)  DO NOT use CHG soap on face, private areas, open wounds, or sores.  Pay special attention to the area where your surgery is being performed.  If you are having back surgery, having someone wash your back for you may be helpful. Wait 2 minutes after CHG soap is applied, then you may rinse off the CHG soap.  Pat dry with a clean towel  Put on clean clothes/pajamas   If you choose to wear lotion, please use ONLY the CHG-compatible lotions on the back of this paper.   Additional instructions for the day of surgery: DO NOT APPLY any lotions, deodorants, cologne, or perfumes.   Do not bring valuables to the hospital. [facility] is not responsible for any belongings/valuables. Do not wear nail polish, gel polish, artificial nails, or any other type of covering on natural nails (fingers and toes) Do not wear jewelry or makeup Put on clean/comfortable clothes.  Please brush your teeth.  Ask your nurse before applying any prescription medications to the skin.     CHG Compatible Lotions   Aveeno Moisturizing lotion  Cetaphil Moisturizing Cream  Cetaphil Moisturizing Lotion  Clairol Herbal Essence Moisturizing Lotion, Dry Skin  Clairol Herbal Essence Moisturizing Lotion, Extra Dry Skin  Clairol Herbal Essence Moisturizing Lotion, Normal Skin  Curel Age Defying Therapeutic Moisturizing Lotion with Alpha Hydroxy  Curel Extreme Care Body Lotion  Curel Soothing Hands Moisturizing Hand Lotion  Curel Therapeutic Moisturizing Cream, Fragrance-Free  Curel Therapeutic Moisturizing Lotion, Fragrance-Free  Curel Therapeutic Moisturizing Lotion, Original Formula  Eucerin Daily Replenishing Lotion  Eucerin Dry Skin Therapy Plus Alpha Hydroxy Crme  Eucerin Dry Skin Therapy Plus Alpha Hydroxy Lotion  Eucerin Original Crme  Eucerin Original Lotion  Eucerin Plus Crme Eucerin Plus Lotion  Eucerin TriLipid Replenishing Lotion  Keri Anti-Bacterial Hand Lotion  Keri Deep  Conditioning Original Lotion Dry Skin Formula Softly Scented  Keri Deep Conditioning Original Lotion, Fragrance Free Sensitive Skin Formula  Keri Lotion Fast Absorbing Fragrance Free Sensitive Skin Formula  Keri Lotion Fast Absorbing Softly Scented Dry Skin Formula  Keri Original Lotion  Keri Skin Renewal Lotion Keri Silky Smooth Lotion  Keri Silky Smooth Sensitive Skin Lotion  Nivea Body Creamy Conditioning Oil  Nivea Body Extra Enriched Lotion  Nivea Body Original Lotion  Nivea Body Sheer Moisturizing Lotion Nivea Crme  Nivea Skin Firming Lotion  NutraDerm 30 Skin Lotion  NutraDerm Skin Lotion  NutraDerm Therapeutic Skin Cream  NutraDerm Therapeutic Skin Lotion  ProShield Protective Hand Cream  Provon moisturizing lotion  Please read over the following fact sheets that you were given.

## 2022-12-23 ENCOUNTER — Encounter (HOSPITAL_COMMUNITY): Payer: Self-pay

## 2022-12-23 ENCOUNTER — Encounter (HOSPITAL_COMMUNITY)
Admission: RE | Admit: 2022-12-23 | Discharge: 2022-12-23 | Disposition: A | Discharge status: Home / Self Care | Payer: PRIVATE HEALTH INSURANCE | Source: Ambulatory Visit | Attending: Neurological Surgery | Admitting: Neurological Surgery

## 2022-12-23 ENCOUNTER — Other Ambulatory Visit: Payer: Self-pay

## 2022-12-23 VITALS — BP 124/84 | HR 79 | Temp 98.8°F | Resp 18 | Ht 66.0 in | Wt 205.1 lb

## 2022-12-23 DIAGNOSIS — Z01818 Encounter for other preprocedural examination: Secondary | ICD-10-CM | POA: Insufficient documentation

## 2022-12-23 DIAGNOSIS — I4510 Unspecified right bundle-branch block: Secondary | ICD-10-CM | POA: Insufficient documentation

## 2022-12-23 LAB — CBC
HCT: 38.6 % (ref 36.0–46.0)
Hemoglobin: 13 g/dL (ref 12.0–15.0)
MCH: 31.3 pg (ref 26.0–34.0)
MCHC: 33.7 g/dL (ref 30.0–36.0)
MCV: 93 fL (ref 80.0–100.0)
Platelets: 336 10*3/uL (ref 150–400)
RBC: 4.15 MIL/uL (ref 3.87–5.11)
RDW: 12.5 % (ref 11.5–15.5)
WBC: 8 10*3/uL (ref 4.0–10.5)
nRBC: 0 % (ref 0.0–0.2)

## 2022-12-23 LAB — BASIC METABOLIC PANEL
Anion gap: 9 (ref 5–15)
BUN: 9 mg/dL (ref 6–20)
CO2: 24 mmol/L (ref 22–32)
Calcium: 9.3 mg/dL (ref 8.9–10.3)
Chloride: 107 mmol/L (ref 98–111)
Creatinine, Ser: 1.36 mg/dL — ABNORMAL HIGH (ref 0.44–1.00)
GFR, Estimated: 47 mL/min — ABNORMAL LOW (ref >60)
Glucose, Bld: 112 mg/dL — ABNORMAL HIGH (ref 70–99)
Potassium: 3.6 mmol/L (ref 3.5–5.1)
Sodium: 140 mmol/L (ref 135–145)

## 2022-12-23 LAB — PROTIME-INR
INR: 1 (ref 0.8–1.2)
Prothrombin Time: 13.6 s (ref 11.4–15.2)

## 2022-12-23 LAB — SURGICAL PCR SCREEN
MRSA, PCR: NEGATIVE
Staphylococcus aureus: NEGATIVE

## 2022-12-23 NOTE — Progress Notes (Signed)
PCP - Billy Fischer Cardiologist -   PPM/ICD - denies Device Orders -  Rep Notified -   Chest x-ray -  EKG - 12/23/22 Stress Test - denies ECHO - denies Cardiac Cath - denies  Sleep Study - denies CPAP -   Fasting Blood Sugar - na Checks Blood Sugar _____ times a day  Last dose of GLP1 agonist-  na GLP1 instructions:   Blood Thinner Instructions:na Aspirin Instructions:  ERAS Protcol -no PRE-SURGERY Ensure or G2-   COVID TEST- na   Anesthesia review: no  Patient denies shortness of breath, fever, cough and chest pain at PAT appointment   All instructions explained to the patient, with a verbal understanding of the material. Patient agrees to go over the instructions while at home for a better understanding. Patient also instructed to wear a mask when out in public prior to surgery. The opportunity to ask questions was provided.

## 2022-12-30 ENCOUNTER — Encounter (HOSPITAL_COMMUNITY)
Admission: RE | Disposition: A | Discharge status: Home / Self Care | Payer: Self-pay | Source: Home / Self Care | Attending: Neurological Surgery

## 2022-12-30 ENCOUNTER — Encounter (HOSPITAL_COMMUNITY): Payer: Self-pay | Admitting: Neurological Surgery

## 2022-12-30 ENCOUNTER — Other Ambulatory Visit: Payer: Self-pay

## 2022-12-30 ENCOUNTER — Ambulatory Visit (HOSPITAL_COMMUNITY): Payer: PRIVATE HEALTH INSURANCE | Admitting: Certified Registered Nurse Anesthetist

## 2022-12-30 ENCOUNTER — Ambulatory Visit (HOSPITAL_COMMUNITY): Payer: PRIVATE HEALTH INSURANCE

## 2022-12-30 ENCOUNTER — Observation Stay (HOSPITAL_COMMUNITY)
Admission: RE | Admit: 2022-12-30 | Discharge: 2022-12-30 | Disposition: A | Discharge status: Home / Self Care | Payer: PRIVATE HEALTH INSURANCE | Attending: Neurological Surgery | Admitting: Neurological Surgery

## 2022-12-30 ENCOUNTER — Ambulatory Visit (HOSPITAL_BASED_OUTPATIENT_CLINIC_OR_DEPARTMENT_OTHER): Payer: PRIVATE HEALTH INSURANCE | Admitting: Certified Registered Nurse Anesthetist

## 2022-12-30 DIAGNOSIS — Z91040 Latex allergy status: Secondary | ICD-10-CM | POA: Diagnosis not present

## 2022-12-30 DIAGNOSIS — M48061 Spinal stenosis, lumbar region without neurogenic claudication: Secondary | ICD-10-CM | POA: Insufficient documentation

## 2022-12-30 DIAGNOSIS — I10 Essential (primary) hypertension: Secondary | ICD-10-CM | POA: Diagnosis not present

## 2022-12-30 DIAGNOSIS — M5116 Intervertebral disc disorders with radiculopathy, lumbar region: Principal | ICD-10-CM | POA: Insufficient documentation

## 2022-12-30 DIAGNOSIS — F17210 Nicotine dependence, cigarettes, uncomplicated: Secondary | ICD-10-CM | POA: Insufficient documentation

## 2022-12-30 DIAGNOSIS — Z79899 Other long term (current) drug therapy: Secondary | ICD-10-CM | POA: Insufficient documentation

## 2022-12-30 DIAGNOSIS — E039 Hypothyroidism, unspecified: Secondary | ICD-10-CM | POA: Diagnosis not present

## 2022-12-30 DIAGNOSIS — Z981 Arthrodesis status: Principal | ICD-10-CM

## 2022-12-30 LAB — POCT PREGNANCY, URINE
Preg Test, Ur: NEGATIVE
Preg Test, Ur: NEGATIVE

## 2022-12-30 SURGERY — POSTERIOR LUMBAR FUSION 1 LEVEL
Anesthesia: General | Site: Spine Lumbar

## 2022-12-30 MED ORDER — LACTATED RINGERS IV SOLN: INTRAVENOUS | Status: DC | PRN

## 2022-12-30 MED ORDER — PHENYLEPHRINE 80 MCG/ML (10ML) SYRINGE FOR IV PUSH (FOR BLOOD PRESSURE SUPPORT)
PREFILLED_SYRINGE | INTRAVENOUS | Status: AC
  Filled 2022-12-30: qty 10

## 2022-12-30 MED ORDER — CHLORHEXIDINE GLUCONATE CLOTH 2 % EX PADS: 6.0000 | MEDICATED_PAD | Freq: Once | CUTANEOUS | Status: DC

## 2022-12-30 MED ORDER — THROMBIN 5000 UNITS EX SOLR
CUTANEOUS | Status: AC
  Filled 2022-12-30: qty 5000

## 2022-12-30 MED ORDER — LIDOCAINE 2% (20 MG/ML) 5 ML SYRINGE
INTRAMUSCULAR | Status: AC
  Filled 2022-12-30: qty 5

## 2022-12-30 MED ORDER — CHLORHEXIDINE GLUCONATE 0.12 % MT SOLN
15.0000 mL | Freq: Once | OROMUCOSAL | Status: AC
Start: 2022-12-30 — End: 2022-12-30
  Administered 2022-12-30: 15 mL via OROMUCOSAL
  Filled 2022-12-30: qty 15

## 2022-12-30 MED ORDER — SURGIRINSE WOUND IRRIGATION SYSTEM - OPTIME: TOPICAL | Status: DC | PRN

## 2022-12-30 MED ORDER — PHENOL 1.4 % MT LIQD: 1.0000 | OROMUCOSAL | Status: DC | PRN

## 2022-12-30 MED ORDER — PHENYLEPHRINE HCL-NACL 20-0.9 MG/250ML-% IV SOLN
INTRAVENOUS | Status: DC | PRN
  Administered 2022-12-30: 50 ug/min via INTRAVENOUS

## 2022-12-30 MED ORDER — ACETAMINOPHEN 500 MG PO TABS: 1000.0000 mg | ORAL_TABLET | Freq: Four times a day (QID) | ORAL | Status: DC

## 2022-12-30 MED ORDER — FENTANYL CITRATE (PF) 100 MCG/2ML IJ SOLN
INTRAMUSCULAR | Status: AC
  Filled 2022-12-30: qty 2

## 2022-12-30 MED ORDER — MENTHOL 3 MG MT LOZG: 1.0000 | LOZENGE | OROMUCOSAL | Status: DC | PRN

## 2022-12-30 MED ORDER — LIDOCAINE HCL (CARDIAC) PF 100 MG/5ML IV SOSY
PREFILLED_SYRINGE | INTRAVENOUS | Status: DC | PRN
  Administered 2022-12-30: 60 mg via INTRAVENOUS

## 2022-12-30 MED ORDER — SODIUM CHLORIDE 0.9% FLUSH: 3.0000 mL | INTRAVENOUS | Status: DC | PRN

## 2022-12-30 MED ORDER — DEXAMETHASONE SODIUM PHOSPHATE 4 MG/ML IJ SOLN: 4.0000 mg | Freq: Four times a day (QID) | INTRAMUSCULAR | Status: DC

## 2022-12-30 MED ORDER — HYDROCHLOROTHIAZIDE 12.5 MG PO TABS
12.5000 mg | ORAL_TABLET | Freq: Every day | ORAL | Status: DC
  Administered 2022-12-30: 12.5 mg via ORAL
  Filled 2022-12-30: qty 1

## 2022-12-30 MED ORDER — THROMBIN 20000 UNITS EX SOLR
CUTANEOUS | Status: AC
  Filled 2022-12-30: qty 20000

## 2022-12-30 MED ORDER — CELECOXIB 200 MG PO CAPS
200.0000 mg | ORAL_CAPSULE | Freq: Two times a day (BID) | ORAL | Status: DC
  Administered 2022-12-30: 200 mg via ORAL
  Filled 2022-12-30: qty 1

## 2022-12-30 MED ORDER — ALBUTEROL SULFATE (2.5 MG/3ML) 0.083% IN NEBU: 2.5000 mg | INHALATION_SOLUTION | Freq: Four times a day (QID) | RESPIRATORY_TRACT | Status: DC | PRN

## 2022-12-30 MED ORDER — ROCURONIUM BROMIDE 100 MG/10ML IV SOLN
INTRAVENOUS | Status: DC | PRN
  Administered 2022-12-30: 20 mg via INTRAVENOUS
  Administered 2022-12-30: 50 mg via INTRAVENOUS

## 2022-12-30 MED ORDER — CEFAZOLIN SODIUM-DEXTROSE 2-4 GM/100ML-% IV SOLN
2.0000 g | INTRAVENOUS | Status: AC
  Administered 2022-12-30: 2 g via INTRAVENOUS
  Filled 2022-12-30: qty 100

## 2022-12-30 MED ORDER — METHOCARBAMOL 500 MG PO TABS
500.0000 mg | ORAL_TABLET | Freq: Four times a day (QID) | ORAL | Status: DC | PRN
  Administered 2022-12-30: 500 mg via ORAL

## 2022-12-30 MED ORDER — BUPIVACAINE HCL (PF) 0.25 % IJ SOLN
INTRAMUSCULAR | Status: DC | PRN
  Administered 2022-12-30: 6 mL

## 2022-12-30 MED ORDER — FENTANYL CITRATE (PF) 250 MCG/5ML IJ SOLN
INTRAMUSCULAR | Status: AC
  Filled 2022-12-30: qty 5

## 2022-12-30 MED ORDER — TIZANIDINE HCL 4 MG PO TABS: 4.0000 mg | ORAL_TABLET | Freq: Four times a day (QID) | ORAL | 0 refills | Status: AC | PRN

## 2022-12-30 MED ORDER — AMISULPRIDE (ANTIEMETIC) 5 MG/2ML IV SOLN
10.0000 mg | Freq: Once | INTRAVENOUS | Status: DC | PRN
Start: 2022-12-30 — End: 2022-12-30

## 2022-12-30 MED ORDER — LISINOPRIL-HYDROCHLOROTHIAZIDE 10-12.5 MG PO TABS: 1.0000 | ORAL_TABLET | Freq: Every day | ORAL | Status: DC

## 2022-12-30 MED ORDER — THROMBIN 20000 UNITS EX SOLR: CUTANEOUS | Status: DC | PRN

## 2022-12-30 MED ORDER — ONDANSETRON HCL 4 MG/2ML IJ SOLN
INTRAMUSCULAR | Status: AC
Start: 2022-12-30 — End: ?
  Filled 2022-12-30: qty 2

## 2022-12-30 MED ORDER — FENTANYL CITRATE (PF) 100 MCG/2ML IJ SOLN
INTRAMUSCULAR | Status: DC | PRN
  Administered 2022-12-30: 50 ug via INTRAVENOUS
  Administered 2022-12-30: 100 ug via INTRAVENOUS

## 2022-12-30 MED ORDER — MORPHINE SULFATE (PF) 2 MG/ML IV SOLN: 2.0000 mg | INTRAVENOUS | Status: DC | PRN

## 2022-12-30 MED ORDER — ACETAMINOPHEN 10 MG/ML IV SOLN
INTRAVENOUS | Status: AC
  Filled 2022-12-30: qty 100

## 2022-12-30 MED ORDER — ORAL CARE MOUTH RINSE: 15.0000 mL | Freq: Once | OROMUCOSAL | Status: AC

## 2022-12-30 MED ORDER — SODIUM CHLORIDE 0.9% FLUSH: 3.0000 mL | Freq: Two times a day (BID) | INTRAVENOUS | Status: DC

## 2022-12-30 MED ORDER — METHOCARBAMOL 500 MG PO TABS: 500.0000 mg | ORAL_TABLET | Freq: Four times a day (QID) | ORAL | 0 refills | Status: AC | PRN

## 2022-12-30 MED ORDER — LISINOPRIL 10 MG PO TABS
10.0000 mg | ORAL_TABLET | Freq: Every day | ORAL | Status: DC
  Administered 2022-12-30: 10 mg via ORAL
  Filled 2022-12-30: qty 1

## 2022-12-30 MED ORDER — SENNA 8.6 MG PO TABS
1.0000 | ORAL_TABLET | Freq: Two times a day (BID) | ORAL | Status: DC
  Administered 2022-12-30: 8.6 mg via ORAL
  Filled 2022-12-30: qty 1

## 2022-12-30 MED ORDER — ACETAMINOPHEN 10 MG/ML IV SOLN
INTRAVENOUS | Status: DC | PRN
  Administered 2022-12-30: 1000 mg via INTRAVENOUS

## 2022-12-30 MED ORDER — 0.9 % SODIUM CHLORIDE (POUR BTL) OPTIME
TOPICAL | Status: DC | PRN
  Administered 2022-12-30: 2000 mL

## 2022-12-30 MED ORDER — LAMOTRIGINE 100 MG PO TABS: 200.0000 mg | ORAL_TABLET | Freq: Every day | ORAL | Status: DC

## 2022-12-30 MED ORDER — METHOCARBAMOL 500 MG PO TABS
ORAL_TABLET | ORAL | Status: AC
  Filled 2022-12-30: qty 1

## 2022-12-30 MED ORDER — SODIUM CHLORIDE 0.9 % IV SOLN: 250.0000 mL | INTRAVENOUS | Status: DC

## 2022-12-30 MED ORDER — ONDANSETRON HCL 4 MG/2ML IJ SOLN
INTRAMUSCULAR | Status: DC | PRN
  Administered 2022-12-30: 4 mg via INTRAVENOUS

## 2022-12-30 MED ORDER — CEFAZOLIN SODIUM-DEXTROSE 2-4 GM/100ML-% IV SOLN
2.0000 g | Freq: Three times a day (TID) | INTRAVENOUS | Status: DC
  Administered 2022-12-30: 2 g via INTRAVENOUS
  Filled 2022-12-30: qty 100

## 2022-12-30 MED ORDER — ONDANSETRON HCL 4 MG/2ML IJ SOLN
4.0000 mg | Freq: Four times a day (QID) | INTRAMUSCULAR | Status: DC | PRN
  Administered 2022-12-30: 4 mg via INTRAVENOUS
  Filled 2022-12-30: qty 2

## 2022-12-30 MED ORDER — MIDAZOLAM HCL 2 MG/2ML IJ SOLN
INTRAMUSCULAR | Status: AC
  Filled 2022-12-30: qty 2

## 2022-12-30 MED ORDER — SCOPOLAMINE 1 MG/3DAYS TD PT72
MEDICATED_PATCH | TRANSDERMAL | Status: DC | PRN
  Administered 2022-12-30: 1 via TRANSDERMAL

## 2022-12-30 MED ORDER — FENTANYL CITRATE (PF) 100 MCG/2ML IJ SOLN
25.0000 ug | INTRAMUSCULAR | Status: DC | PRN
  Administered 2022-12-30 (×3): 50 ug via INTRAVENOUS

## 2022-12-30 MED ORDER — KCL IN DEXTROSE-NACL 10-5-0.45 MEQ/L-%-% IV SOLN
INTRAVENOUS | Status: DC
  Filled 2022-12-30: qty 1000

## 2022-12-30 MED ORDER — OXYCODONE HCL 5 MG PO TABS: 5.0000 mg | ORAL_TABLET | ORAL | Status: DC | PRN

## 2022-12-30 MED ORDER — BUPIVACAINE HCL (PF) 0.25 % IJ SOLN
INTRAMUSCULAR | Status: AC
  Filled 2022-12-30: qty 30

## 2022-12-30 MED ORDER — THROMBIN 5000 UNITS EX SOLR: OROMUCOSAL | Status: DC | PRN

## 2022-12-30 MED ORDER — METHOCARBAMOL 1000 MG/10ML IJ SOLN: 500.0000 mg | Freq: Four times a day (QID) | INTRAMUSCULAR | Status: DC | PRN

## 2022-12-30 MED ORDER — MIDAZOLAM HCL 5 MG/5ML IJ SOLN
INTRAMUSCULAR | Status: DC | PRN
  Administered 2022-12-30: 2 mg via INTRAVENOUS

## 2022-12-30 MED ORDER — PROPOFOL 10 MG/ML IV BOLUS
INTRAVENOUS | Status: DC | PRN
  Administered 2022-12-30: 200 mg via INTRAVENOUS

## 2022-12-30 MED ORDER — DEXAMETHASONE SODIUM PHOSPHATE 10 MG/ML IJ SOLN
INTRAMUSCULAR | Status: DC | PRN
  Administered 2022-12-30: 10 mg via INTRAVENOUS

## 2022-12-30 MED ORDER — OXYCODONE-ACETAMINOPHEN 5-325 MG PO TABS: 1.0000 | ORAL_TABLET | ORAL | 0 refills | Status: DC | PRN

## 2022-12-30 MED ORDER — ROCURONIUM BROMIDE 10 MG/ML (PF) SYRINGE
PREFILLED_SYRINGE | INTRAVENOUS | Status: AC
  Filled 2022-12-30: qty 10

## 2022-12-30 MED ORDER — SCOPOLAMINE 1 MG/3DAYS TD PT72
MEDICATED_PATCH | TRANSDERMAL | Status: AC
  Filled 2022-12-30: qty 1

## 2022-12-30 MED ORDER — PHENYLEPHRINE 80 MCG/ML (10ML) SYRINGE FOR IV PUSH (FOR BLOOD PRESSURE SUPPORT)
PREFILLED_SYRINGE | INTRAVENOUS | Status: DC | PRN
  Administered 2022-12-30 (×2): 160 ug via INTRAVENOUS
  Administered 2022-12-30 (×2): 80 ug via INTRAVENOUS

## 2022-12-30 MED ORDER — DEXAMETHASONE 4 MG PO TABS
4.0000 mg | ORAL_TABLET | Freq: Four times a day (QID) | ORAL | Status: DC
  Administered 2022-12-30: 4 mg via ORAL
  Filled 2022-12-30: qty 1

## 2022-12-30 MED ORDER — DEXAMETHASONE SODIUM PHOSPHATE 10 MG/ML IJ SOLN
INTRAMUSCULAR | Status: AC
  Filled 2022-12-30: qty 1

## 2022-12-30 MED ORDER — SUGAMMADEX SODIUM 200 MG/2ML IV SOLN
INTRAVENOUS | Status: DC | PRN
  Administered 2022-12-30: 200 mg via INTRAVENOUS

## 2022-12-30 MED ORDER — ONDANSETRON HCL 4 MG PO TABS: 4.0000 mg | ORAL_TABLET | Freq: Four times a day (QID) | ORAL | Status: DC | PRN

## 2022-12-30 MED ORDER — PROPOFOL 10 MG/ML IV BOLUS
INTRAVENOUS | Status: AC
  Filled 2022-12-30: qty 20

## 2022-12-30 SURGICAL SUPPLY — 55 items
BAG COUNTER SPONGE SURGICOUNT (BAG) ×2 IMPLANT
BASKET BONE COLLECTION (BASKET) ×2 IMPLANT
BENZOIN TINCTURE PRP APPL 2/3 (GAUZE/BANDAGES/DRESSINGS) ×2 IMPLANT
BIT DRILL PLIF MAS DISP 5.5MM (DRILL) IMPLANT
BLADE BONE MILL MEDIUM (MISCELLANEOUS) ×2 IMPLANT
BLADE CLIPPER SURG (BLADE) IMPLANT
BONE MATRIX OSTEOCEL PRO MED (Bone Implant) IMPLANT
BUR CARBIDE MATCH 3.0 (BURR) ×2 IMPLANT
CAGE COROENT 10X9X23 (Cage) IMPLANT
CANISTER SUCT 3000ML PPV (MISCELLANEOUS) ×2 IMPLANT
CNTNR URN SCR LID CUP LEK RST (MISCELLANEOUS) ×2 IMPLANT
COVER BACK TABLE 60X90IN (DRAPES) ×2 IMPLANT
DERMABOND ADVANCED .7 DNX12 (GAUZE/BANDAGES/DRESSINGS) ×2 IMPLANT
DRAPE C-ARM 42X72 X-RAY (DRAPES) ×4 IMPLANT
DRAPE C-ARMOR (DRAPES) ×2 IMPLANT
DRAPE LAPAROTOMY 100X72X124 (DRAPES) ×2 IMPLANT
DRAPE SURG 17X23 STRL (DRAPES) ×2 IMPLANT
DRILL PLIF MAS DISP 5.5MM (DRILL) ×1
DRSG AQUACEL AG ADV 3.5X 6 (GAUZE/BANDAGES/DRESSINGS) IMPLANT
DURAPREP 26ML APPLICATOR (WOUND CARE) ×2 IMPLANT
ELECT REM PT RETURN 9FT ADLT (ELECTROSURGICAL) ×1
ELECTRODE REM PT RTRN 9FT ADLT (ELECTROSURGICAL) ×2 IMPLANT
EVACUATOR 1/8 PVC DRAIN (DRAIN) ×2 IMPLANT
GAUZE 4X4 16PLY ~~LOC~~+RFID DBL (SPONGE) IMPLANT
GLOVE BIO SURGEON STRL SZ7 (GLOVE) IMPLANT
GLOVE BIO SURGEON STRL SZ8 (GLOVE) ×4 IMPLANT
GLOVE BIOGEL PI IND STRL 7.0 (GLOVE) IMPLANT
GOWN STRL REUS W/ TWL LRG LVL3 (GOWN DISPOSABLE) IMPLANT
GOWN STRL REUS W/ TWL XL LVL3 (GOWN DISPOSABLE) ×4 IMPLANT
GOWN STRL REUS W/TWL 2XL LVL3 (GOWN DISPOSABLE) IMPLANT
HEMOSTAT POWDER KIT SURGIFOAM (HEMOSTASIS) ×2 IMPLANT
KIT BASIN OR (CUSTOM PROCEDURE TRAY) ×2 IMPLANT
KIT POSITION SURG JACKSON T1 (MISCELLANEOUS) ×2 IMPLANT
KIT TURNOVER KIT B (KITS) ×2 IMPLANT
MILL BONE PREP (MISCELLANEOUS) ×2 IMPLANT
NDL HYPO 25X1 1.5 SAFETY (NEEDLE) ×2 IMPLANT
NEEDLE HYPO 25X1 1.5 SAFETY (NEEDLE) ×1 IMPLANT
NS IRRIG 1000ML POUR BTL (IV SOLUTION) ×2 IMPLANT
PACK LAMINECTOMY NEURO (CUSTOM PROCEDURE TRAY) ×2 IMPLANT
PAD ARMBOARD 7.5X6 YLW CONV (MISCELLANEOUS) ×6 IMPLANT
ROD PREBENT 45MM LUMBAR (Rod) IMPLANT
SCREW LOCK FXNS SPNE MAS PL (Screw) IMPLANT
SCREW SHANK 6.5X45 (Screw) IMPLANT
SCREW TULIP 5.5 (Screw) IMPLANT
SOLUTION IRRIG SURGIPHOR (IV SOLUTION) ×2 IMPLANT
SPONGE SURGIFOAM ABS GEL 100 (HEMOSTASIS) ×2 IMPLANT
SPONGE T-LAP 4X18 ~~LOC~~+RFID (SPONGE) IMPLANT
STRIP CLOSURE SKIN 1/2X4 (GAUZE/BANDAGES/DRESSINGS) ×4 IMPLANT
SUT VIC AB 0 CT1 18XCR BRD8 (SUTURE) ×2 IMPLANT
SUT VIC AB 2-0 CP2 18 (SUTURE) ×2 IMPLANT
SUT VIC AB 3-0 SH 8-18 (SUTURE) ×4 IMPLANT
TOWEL GREEN STERILE (TOWEL DISPOSABLE) ×2 IMPLANT
TOWEL GREEN STERILE FF (TOWEL DISPOSABLE) ×2 IMPLANT
TRAY FOLEY MTR SLVR 16FR STAT (SET/KITS/TRAYS/PACK) ×2 IMPLANT
WATER STERILE IRR 1000ML POUR (IV SOLUTION) ×2 IMPLANT

## 2022-12-30 NOTE — Transfer of Care (Signed)
Immediate Anesthesia Transfer of Care Note  Patient: Yolanda Weaver  Procedure(s) Performed: Posterior Lumba Interbody Fusion-Lumbar Four-Lumbar Five-Posterior Lateral and Interbody Fusion-Extension of Hardware (Spine Lumbar)  Patient Location: PACU  Anesthesia Type:General  Level of Consciousness: awake, alert , and oriented  Airway & Oxygen Therapy: Patient Spontanous Breathing and Patient connected to face mask oxygen  Post-op Assessment: Report given to RN and Post -op Vital signs reviewed and stable  Post vital signs: Reviewed and stable  Last Vitals:  Vitals Value Taken Time  BP 167/78 12/30/22 1115  Temp    Pulse 80 12/30/22 1120  Resp 14 12/30/22 1120  SpO2 100 % 12/30/22 1120  Vitals shown include unfiled device data.  Last Pain:  Vitals:   12/30/22 0658  TempSrc:   PainSc: 2          Complications: No notable events documented.

## 2022-12-30 NOTE — Anesthesia Procedure Notes (Signed)
Procedure Name: Intubation Date/Time: 12/30/2022 8:48 AM  Performed by: [place], Clydene Pugh, CRNAPre-anesthesia Checklist: Patient identified, Emergency Drugs available, Suction available and Patient being monitored Patient Re-evaluated:Patient Re-evaluated prior to induction Oxygen Delivery Method: Circle system utilized Preoxygenation: Pre-oxygenation with 100% oxygen Induction Type: IV induction Ventilation: Mask ventilation without difficulty Laryngoscope Size: Mac and 3 Grade View: Grade I Tube type: Oral Tube size: 7.0 mm Number of attempts: 1 Airway Equipment and Method: Stylet and Oral airway Placement Confirmation: ETT inserted through vocal cords under direct vision, positive ETCO2 and breath sounds checked- equal and bilateral Secured at: 21 cm Tube secured with: Tape Dental Injury: Teeth and Oropharynx as per pre-operative assessment

## 2022-12-30 NOTE — Anesthesia Preprocedure Evaluation (Signed)
Anesthesia Evaluation  Patient identified by MRN, date of birth, ID band Patient awake    Reviewed: Allergy & Precautions, NPO status , Patient's Chart, lab work & pertinent test results  History of Anesthesia Complications (+) PONV and history of anesthetic complications  Airway Mallampati: II  TM Distance: >3 FB Neck ROM: Full    Dental  (+) Dental Advisory Given   Pulmonary Current Smoker and Patient abstained from smoking.   breath sounds clear to auscultation       Cardiovascular hypertension, Pt. on medications  Rhythm:Regular Rate:Normal     Neuro/Psych  Headaches    GI/Hepatic negative GI ROS, Neg liver ROS,,,  Endo/Other  Hypothyroidism    Renal/GU negative Renal ROS     Musculoskeletal  (+) Arthritis ,    Abdominal   Peds  Hematology negative hematology ROS (+)   Anesthesia Other Findings   Reproductive/Obstetrics                             Anesthesia Physical Anesthesia Plan  ASA: 2  Anesthesia Plan: General   Post-op Pain Management: Ofirmev IV (intra-op)* and Toradol IV (intra-op)*   Induction: Intravenous  PONV Risk Score and Plan: 3 and Dexamethasone, Ondansetron, Midazolam and Treatment may vary due to age or medical condition  Airway Management Planned: Oral ETT  Additional Equipment: None  Intra-op Plan:   Post-operative Plan: Extubation in OR  Informed Consent: I have reviewed the patients History and Physical, chart, labs and discussed the procedure including the risks, benefits and alternatives for the proposed anesthesia with the patient or authorized representative who has indicated his/her understanding and acceptance.     Dental advisory given  Plan Discussed with: CRNA  Anesthesia Plan Comments:        Anesthesia Quick Evaluation

## 2022-12-30 NOTE — Anesthesia Postprocedure Evaluation (Signed)
Anesthesia Post Note  Patient: Yolanda Weaver  Procedure(s) Performed: Posterior Lumba Interbody Fusion-Lumbar Four-Lumbar Five-Posterior Lateral and Interbody Fusion-Extension of Hardware (Spine Lumbar)     Patient location during evaluation: PACU Anesthesia Type: General Level of consciousness: awake and alert Pain management: pain level controlled Vital Signs Assessment: post-procedure vital signs reviewed and stable Respiratory status: spontaneous breathing, nonlabored ventilation, respiratory function stable and patient connected to nasal cannula oxygen Cardiovascular status: blood pressure returned to baseline and stable Postop Assessment: no apparent nausea or vomiting Anesthetic complications: no  No notable events documented.  Last Vitals:  Vitals:   12/30/22 1245 12/30/22 1309  BP: 123/73 (!) 154/88  Pulse: 65 76  Resp: 12 18  Temp: 36.7 C 36.7 C  SpO2: 96% 98%    Last Pain:  Vitals:   12/30/22 1138  TempSrc:   PainSc: 10-Worst pain ever                 Kennieth Rad

## 2022-12-30 NOTE — Evaluation (Addendum)
Occupational Therapy Evaluation Patient Details Name: Yolanda Weaver MRN: [id number] DOB: 04/27/1970 Today's Date: 12/30/2022   History of Present Illness 52 yo F s/p PLIF.  Prior PLIF, HTN, anxiety.   Clinical Impression   Patient admitted for PLIF, patient with 2 prior back surgeries starting back in 2016.  PTA remained independent with ADL, iADL and mobility despite increasing R leg pain.  No falls reported.  Currently patient is up and moving well, able to complete bed mobility Mod I, walking in the halls without assist, nausea is her only complaint.  Patient able to climb a set of stairs, and completed ADL with assist to donn L sock only.  Patient should return to her baseline as discomfort resolves.  All education reviewed and precaution sheet issued.  Patient verbalizes understanding, and no further questions.  No further OT needs in the acute setting.  Recommend follow up as prescribed by MD.         If plan is discharge home, recommend the following:      Functional Status Assessment  Patient has not had a recent decline in their functional status  Equipment Recommendations  None recommended by OT    Recommendations for Other Services       Precautions / Restrictions Precautions Precautions: Back Precaution Booklet Issued: Yes (comment) Required Braces or Orthoses: Spinal Brace Spinal Brace: Thoracolumbosacral orthotic Restrictions Weight Bearing Restrictions: No      Mobility Bed Mobility Overal bed mobility: Modified Independent                  Transfers Overall transfer level: Independent Equipment used: None                      Balance Overall balance assessment: Mild deficits observed, not formally tested                                         ADL either performed or assessed with clinical judgement   ADL Overall ADL's : At baseline                                       General ADL Comments: Min A  for L sock due to stretch sensation to back.     Vision Patient Visual Report: No change from baseline       Perception Perception: Within Functional Limits       Praxis Praxis: WFL       Pertinent Vitals/Pain Pain Assessment Pain Assessment: Faces Faces Pain Scale: Hurts little more Pain Location: Incisional Pain Descriptors / Indicators: Operative site guarding Pain Intervention(s): Monitored during session     Extremity/Trunk Assessment Upper Extremity Assessment Upper Extremity Assessment: Overall WFL for tasks assessed   Lower Extremity Assessment Lower Extremity Assessment: Overall WFL for tasks assessed   Cervical / Trunk Assessment Cervical / Trunk Assessment: Back Surgery   Communication Communication Communication: No apparent difficulties   Cognition Arousal: Alert Behavior During Therapy: WFL for tasks assessed/performed Overall Cognitive Status: Within Functional Limits for tasks assessed                                       General Comments   VSS on  RA    Exercises     Shoulder Instructions      Home Living Family/patient expects to be discharged to:: Private residence Living Arrangements: Children Available Help at Discharge: Family;Available 24 hours/day Type of Home: House Home Access: Stairs to enter [employer] of Steps: 2 Entrance Stairs-Rails: None Home Layout: Two level;Able to live on main level with bedroom/bathroom Alternate Level Stairs-Number of Steps: 16 Alternate Level Stairs-Rails: Left Bathroom Shower/Tub: Walk-in shower;Door   [employer] Toilet: Pharmacist, community: Yes   Home Equipment: Information systems manager          Prior Functioning/Environment Prior Level of Function : Independent/Modified Independent;Driving                        OT Problem List: Pain      OT Treatment/Interventions:      OT Goals(Current goals can be found in the care plan section) Acute Rehab OT  Goals Patient Stated Goal: Return home OT Goal Formulation: With patient Time For Goal Achievement: 01/02/23 Potential to Achieve Goals: Good  OT Frequency:      Co-evaluation              AM-PAC OT "6 Clicks" Daily Activity     Outcome Measure Help from another person eating meals?: None Help from another person taking care of personal grooming?: None Help from another person toileting, which includes using toliet, bedpan, or urinal?: None Help from another person bathing (including washing, rinsing, drying)?: None Help from another person to put on and taking off regular upper body clothing?: None Help from another person to put on and taking off regular lower body clothing?: A Little 6 Click Score: 23   End of Session Equipment Utilized During Treatment: Back brace Nurse Communication: Mobility status  Activity Tolerance: Patient tolerated treatment well Patient left: in chair;with call bell/phone within reach;with family/visitor present  OT Visit Diagnosis: Pain Pain - Right/Left: Right Pain - part of body: Leg                Time: 3664-4034 OT Time Calculation (min): 22 min Charges:  OT General Charges $OT Visit: 1 Visit OT Evaluation $OT Eval Moderate Complexity: 1 Mod  12/30/2022  RP, OTR/L  Acute Rehabilitation Services  Office:  [phone]   CACI WANSER 12/30/2022, 4:03 PM

## 2022-12-30 NOTE — Progress Notes (Signed)
No second T/S needed per blood bank.

## 2022-12-30 NOTE — Op Note (Signed)
12/30/2022  9:01 AM  PATIENT:  Yolanda Weaver  52 y.o. female  PRE-OPERATIVE DIAGNOSIS: Spondylolisthesis L4-5, facet arthropathy L4-5, spinal stenosis L4-5, radiculopathy, previous L5-S1 fusion  POST-OPERATIVE DIAGNOSIS:  same  PROCEDURE:   1. Decompressive lumbar laminectomy, hemi facetectomy and foraminotomies L4-5 requiring more work than would be required for a simple exposure of the disk for PLIF in order to adequately decompress the neural elements and address the spinal stenosis 2. Posterior lumbar interbody fusion L4-5 using Peek interbody cages packed with morcellized allograft and autograft  3. Posterior fixation L4-5 using NuVasive cortical pedicle screws.  4. Intertransverse arthrodesis L4-5 using morcellized autograft and allograft. 5.  Removal of nonsegmental instrumentation L5-S1  SURGEON:  Marikay Alar, MD  ASSISTANTS: Verlin Dike, FNP  ANESTHESIA:  General  EBL: 100 ml  No intake/output data recorded.  BLOOD ADMINISTERED:none  DRAINS: none   INDICATION FOR PROCEDURE: This patient presented with back pain with leg pain. Imaging revealed solid arthrodesis L5-S1 with adjacent level disease at L4-5 with spondylolisthesis with stenosis. The patient tried a reasonable attempt at conservative medical measures without relief. I recommended decompression and instrumented fusion to address the stenosis as well as the segmental  instability.  Patient understood the risks, benefits, and alternatives and potential outcomes and wished to proceed.  PROCEDURE DETAILS:  The patient was brought to the operating room. After induction of generalized endotracheal anesthesia the patient was rolled into the prone position on chest rolls and all pressure points were padded. The patient's lumbar region was cleaned and then prepped with DuraPrep and draped in the usual sterile fashion. Anesthesia was injected and then a dorsal midline incision was made and carried down to the lumbosacral  fascia. The fascia was opened and the paraspinous musculature was taken down in a subperiosteal fashion to expose L4-5 and the previously placed instrumentation. A self-retaining retractor was placed.  I remove the locking caps from the recently placed instrumentation and remove the rods.  The L5 and S1 screws had great purchase.  Intraoperative fluoroscopy confirmed my level, and I started with placement of the L4 cortical pedicle screws. The pedicle screw entry zones were identified utilizing surface landmarks and  AP and lateral fluoroscopy. I scored the cortex with the high-speed drill and then used the hand drill to drill an upward and outward direction into the pedicle. I then tapped line to line. I then placed a 6.5 x 40 mm cortical pedicle screw into the pedicles of L4 bilaterally.    I then turned my attention to the decompression and complete lumbar laminectomies, hemi- facetectomies, and foraminotomies were performed at L4-5.  My nurse practitioner was directly involved in the decompression and exposure of the neural elements. the patient had significant spinal stenosis and this required more work than would be required for a simple exposure of the disc for posterior lumbar interbody fusion which would only require a limited laminotomy. Much more generous decompression and generous foraminotomy was undertaken in order to adequately decompress the neural elements and address the patient's leg pain. The yellow ligament was removed to expose the underlying dura and nerve roots, and generous foraminotomies were performed to adequately decompress the neural elements. Both the exiting and traversing nerve roots were decompressed on both sides until a coronary dilator passed easily along the nerve roots. Once the decompression was complete, I turned my attention to the posterior lower lumbar interbody fusion. The epidural venous vasculature was coagulated and cut sharply. Disc space was incised and the initial  discectomy was performed with pituitary rongeurs. The disc space was distracted with sequential distractors to a height of 9 mm. We then used a series of scrapers and shavers to prepare the endplates for fusion. The midline was prepared with Epstein curettes. Once the complete discectomy was finished, we packed an appropriate sized interbody cage with local autograft and morcellized allograft, gently retracted the nerve root, and tapped the cage into position at L4-5.  The midline between the cages was packed with morselized autograft and allograft.    We then decorticated the transverse processes and laid a mixture of morcellized autograft and allograft out over these to perform intertransverse arthrodesis at L4-5. We then placed lordotic rods into the multiaxial screw heads of the pedicle screws and locked these in position with the locking caps and anti-torque device. We then checked our construct with AP and lateral fluoroscopy. Irrigated with copious amounts of 0.5% povidone iodine solution followed by saline solution. Inspected the nerve roots once again to assure adequate decompression, lined to the dura with Gelfoam,  and then we closed the muscle and the fascia with 0 Vicryl. Closed the subcutaneous tissues with 2-0 Vicryl and subcuticular tissues with 3-0 Vicryl. The skin was closed with benzoin and Steri-Strips. Dressing was then applied, the patient was awakened from general anesthesia and transported to the recovery room in stable condition. At the end of the procedure all sponge, needle and instrument counts were correct.   PLAN OF CARE: admit to inpatient  PATIENT DISPOSITION:  PACU - hemodynamically stable.   Delay start of Pharmacological VTE agent (>24hrs) due to surgical blood loss or risk of bleeding:  yes

## 2022-12-30 NOTE — Plan of Care (Signed)
Pt doing well. Pt and daughter given D/C instructions with verbal understanding. Rx's were sent to the pharmacy by MD. Pt's incision is clean and dry with no sign of infection. Pt's IV was removed prior to D/C. Pt D/C'd home via wheelchair per MD order. Pt is stable @ D/C and has no other needs at this time. Rema Fendt, RN

## 2022-12-30 NOTE — Discharge Summary (Signed)
Physician Discharge Summary  Patient ID: Yolanda Weaver MRN: [id number] DOB/AGE: 52-Sep-1972 52 y.o.  Admit date: 12/30/2022 Discharge date: 12/30/2022  Admission Diagnoses: Spondylolisthesis L4-5, facet arthropathy L4-5, spinal stenosis L4-5, radiculopathy, previous L5-S1 fusion      Discharge Diagnoses: same   Discharged Condition: good  Hospital Course: The patient was admitted on 12/30/2022 and taken to the operating room where the patient underwent PLIF L4-5. The patient tolerated the procedure well and was taken to the recovery room and then to the floor in stable condition. The hospital course was routine. There were no complications. The wound remained clean dry and intact. Pt had appropriate back soreness. No complaints of leg pain or new N/T/W. The patient remained afebrile with stable vital signs, and tolerated a regular diet. The patient continued to increase activities, and pain was well controlled with oral pain medications.   Consults: None  Significant Diagnostic Studies:  Results for orders placed or performed during the hospital encounter of 12/30/22  Pregnancy, urine POC  Result Value Ref Range   Preg Test, Ur NEGATIVE NEGATIVE  Pregnancy, urine POC  Result Value Ref Range   Preg Test, Ur NEGATIVE NEGATIVE    DG Lumbar Spine 2-3 Views  Result Date: 12/30/2022 CLINICAL DATA:  Elective surgery. EXAM: LUMBAR SPINE - 2-3 VIEW COMPARISON:  Preoperative imaging. FINDINGS: Three fluoroscopic spot views of the lumbar spine obtained in the operating room. There is new fusion hardware at L4-L5 with interbody spacer. Fluoroscopy time 29 seconds. Dose 25.26 mGy. IMPRESSION: Intraoperative fluoroscopy during L4-L5 fusion. Electronically Signed   By: Narda Rutherford M.D.   On: 12/30/2022 11:23   DG C-Arm 1-60 Min-No Report  Result Date: 12/30/2022 Fluoroscopy was utilized by the requesting physician.  No radiographic interpretation.   DG C-Arm 1-60 Min-No Report  Result  Date: 12/30/2022 Fluoroscopy was utilized by the requesting physician.  No radiographic interpretation.    Antibiotics:  Anti-infectives (From admission, onward)    Start     Dose/Rate Route Frequency Ordered Stop   12/30/22 1500  ceFAZolin (ANCEF) IVPB 2g/100 mL premix        2 g 200 mL/hr over 30 Minutes Intravenous Every 8 hours 12/30/22 1300 12/31/22 0659   12/30/22 0645  ceFAZolin (ANCEF) IVPB 2g/100 mL premix        2 g 200 mL/hr over 30 Minutes Intravenous On call to O.R. 12/30/22 1914 12/30/22 0851       Discharge Exam: Blood pressure (!) 154/88, pulse 76, temperature 98 F (36.7 C), resp. rate 18, height 5\' 6"  (1.676 m), weight 92.1 kg, SpO2 98%. Neurologic: Grossly normal Ambulating and voiding well incision cdi   Discharge Medications:   Allergies as of 12/30/2022       Reactions   Latex Rash   Codeine Nausea And Vomiting   Diphenhydramine Itching   Tramadol Hives   Acetaminophen Hives, Nausea And Vomiting   Dilaudid [hydromorphone Hcl] Hives, Nausea And Vomiting   Erythromycin Rash   Hydrocodone Hives, Nausea And Vomiting   Meloxicam Hives, Nausea And Vomiting   Oxycodone Hives, Nausea And Vomiting        Medication List     STOP taking these medications    ibuprofen 800 MG tablet Commonly known as: ADVIL       TAKE these medications    albuterol 108 (90 Base) MCG/ACT inhaler Commonly known as: Ventolin HFA TAKE 2 PUFFS BY MOUTH EVERY 6 HOURS AS NEEDED FOR WHEEZE OR SHORTNESS OF BREATH  guaifenesin 100 MG/5ML syrup Commonly known as: ROBITUSSIN Take 200 mg by mouth 3 (three) times daily as needed for cough.   lamoTRIgine 200 MG tablet Commonly known as: LAMICTAL TAKE 1 AND 1/2 TABLETS DAILY BY MOUTH AT BEDTIME What changed:  how much to take how to take this when to take this additional instructions   levonorgestrel 20 MCG/24HR IUD Commonly known as: MIRENA 1 each by Intrauterine route once.   lisinopril-hydrochlorothiazide  10-12.5 MG tablet Commonly known as: ZESTORETIC TAKE 1 TABLET BY MOUTH EVERY DAYlisinopril 10 mg-hydrochlorothiazide 12.5 mg tablet  TAKE 1 TABLET BY MOUTH EVERY DAY What changed:  how much to take how to take this when to take this additional instructions   methocarbamol 500 MG tablet Commonly known as: ROBAXIN Take 1 tablet (500 mg total) by mouth every 6 (six) hours as needed for muscle spasms.   oxyCODONE-acetaminophen 5-325 MG tablet Commonly known as: PERCOCET/ROXICET Take 1 tablet by mouth every 4 (four) hours as needed for severe pain (pain score 7-10).   SEVERE SINUS CONGESTION & PAIN PO Take 2 tablets by mouth every 6 (six) hours as needed (congestion).   tiZANidine 4 MG tablet Commonly known as: Zanaflex Take 1 tablet (4 mg total) by mouth every 6 (six) hours as needed for muscle spasms.               Durable Medical Equipment  (From admission, onward)           Start     Ordered   12/30/22 1256  DME Walker rolling  Once       Question:  Patient needs a walker to treat with the following condition  Answer:  S/P lumbar fusion   12/30/22 1255   12/30/22 1256  DME 3 n 1  Once        12/30/22 1255            Disposition: home   Final Dx: PLIF L4-5  Discharge Instructions      Remove dressing in 72 hours   Complete by: As directed    Call MD for:   Complete by: As directed    Call MD for:  difficulty breathing, headache or visual disturbances   Complete by: As directed    Call MD for:  hives   Complete by: As directed    Call MD for:  persistant dizziness or light-headedness   Complete by: As directed    Call MD for:  persistant nausea and vomiting   Complete by: As directed    Call MD for:  redness, tenderness, or signs of infection (pain, swelling, redness, odor or green/yellow discharge around incision site)   Complete by: As directed    Call MD for:  severe uncontrolled pain   Complete by: As directed    Call MD for:  temperature  >100.4   Complete by: As directed    Diet - low sodium heart healthy   Complete by: As directed    Driving Restrictions   Complete by: As directed    No driving for 2 weeks, no riding in the car for 1 week   Increase activity slowly   Complete by: As directed    Lifting restrictions   Complete by: As directed    No lifting more than 8 lbs          Signed: Tiana [facility] 12/30/2022, 2:51 PM

## 2022-12-30 NOTE — H&P (Signed)
Subjective: Patient is a 52 y.o. female admitted for back and leg pain. Onset of symptoms was several months ago, gradually worsening since that time.  The pain is rated severe, and is located at the across the lower back and radiates to legs. The pain is described as aching and occurs all day. The symptoms have been progressive. Symptoms are exacerbated by exercise, extension, and standing. MRI or CT showed adjacent level spondylolisthesis with stenosis L4-5   Past Medical History:  Diagnosis Date   Acne    Allergy    Anxiety    Bipolar 1 disorder (HCC)    Blood transfusion    h/o tx for rh incompatibility during pregnancy. see note re significant antibody   Contraception    Depression    Hypertension    Hypothyroid    Mental disorder    bipolar   Migraine    Obesity    PONV (postoperative nausea and vomiting)    Ptosis, right eyelid    Recurrent upper respiratory infection (URI)    reports she had bronchitis, treated /w antibiotics in past month    Past Surgical History:  Procedure Laterality Date   DIAGNOSTIC LAPAROSCOPY     evaluation for endometriosis   FOOT FOREIGN BODY REMOVAL     KNEE ARTHROSCOPY     x3 r   LUMBAR FUSION  01/15/2015   LUMBAR LAMINECTOMY  04/08/2011   Procedure: MICRODISCECTOMY LUMBAR LAMINECTOMY;  Surgeon: Eldred Manges, MD;  Location: [facility] OR;  Service: Orthopedics;  Laterality: Right;  Right L5-S1 Microdiscectomy   MAXIMUM ACCESS (MAS)POSTERIOR LUMBAR INTERBODY FUSION (PLIF) 1 LEVEL N/A 01/15/2015   Procedure: LUMBAR FIVE-SACRA L-ONE MAXIMUM ACCESS SURGERY POSTERIOR LUMBAR INTERBODY FUSION ;  Surgeon: Tia Alert, MD;  Location: [facility];  Service: Neurosurgery;  Laterality: N/A;   NECK SURGERY  07/21/2016   fusion of C4 an C5   THYROIDECTOMY, PARTIAL     right lobe hurthle cell adenoma    Prior to Admission medications   Medication Sig Start Date End Date Taking? Authorizing Provider  guaifenesin (ROBITUSSIN) 100 MG/5ML syrup Take 200 mg by  mouth 3 (three) times daily as needed for cough.   Yes [provider]  ibuprofen (ADVIL) 800 MG tablet TAKE 1 TABLET BY MOUTH EVERY 8 HOURS AS NEEDED WITH FOOD 06/27/22  Yes Eldred Manges, MD  lamoTRIgine (LAMICTAL) 200 MG tablet TAKE 1 AND 1/2 TABLETS DAILY BY MOUTH AT BEDTIME Patient taking differently: Take 200 mg by mouth at bedtime. 03/16/22  Yes Ronnald Nian, MD  levonorgestrel (MIRENA) 20 MCG/24HR IUD 1 each by Intrauterine route once.   Yes [provider]  lisinopril-hydrochlorothiazide (ZESTORETIC) 10-12.5 MG tablet TAKE 1 TABLET BY MOUTH EVERY DAYlisinopril 10 mg-hydrochlorothiazide 12.5 mg tablet  TAKE 1 TABLET BY MOUTH EVERY DAY Patient taking differently: Take 1 tablet by mouth daily. 08/01/22  Yes Ronnald Nian, MD  Phenylephrine-APAP-guaiFENesin (SEVERE SINUS CONGESTION & PAIN PO) Take 2 tablets by mouth every 6 (six) hours as needed (congestion).   Yes [provider]  albuterol (VENTOLIN HFA) 108 (90 Base) MCG/ACT inhaler TAKE 2 PUFFS BY MOUTH EVERY 6 HOURS AS NEEDED FOR WHEEZE OR SHORTNESS OF BREATH 03/23/20   Ronnald Nian, MD   Allergies  Allergen Reactions   Latex Rash   Codeine Nausea And Vomiting   Diphenhydramine Itching   Tramadol Hives   Acetaminophen Hives and Nausea And Vomiting   Dilaudid [Hydromorphone Hcl] Hives and Nausea And Vomiting  Erythromycin Rash   Hydrocodone Hives and Nausea And Vomiting   Meloxicam Hives and Nausea And Vomiting   Oxycodone Hives and Nausea And Vomiting    Social History   Tobacco Use   Smoking status: Every Day    Types: Cigarettes    Start date: 2003   Smokeless tobacco: Never   Tobacco comments:    varies  Substance Use Topics   Alcohol use: Yes    Comment: occ    Family History  Problem Relation Age of Onset   COPD Father    Cancer Other    Hypertension Other    Diabetes Other    Anesthesia problems Neg Hx    Hypotension Neg Hx    Malignant hyperthermia Neg Hx    Pseudochol  deficiency Neg Hx      Review of Systems  Positive ROS: neg  All other systems have been reviewed and were otherwise negative with the exception of those mentioned in the HPI and as above.  Objective: Vital signs in last 24 hours: Temp:  [97.8 F (36.6 C)] 97.8 F (36.6 C) (11/22 7829) Pulse Rate:  [72] 72 (11/22 0652) Resp:  [18] 18 (11/22 0652) BP: (125)/(85) 125/85 (11/22 0652) SpO2:  [96 %] 96 % (11/22 0652) Weight:  [92.1 kg] 92.1 kg (11/22 0652)  General Appearance: Alert, cooperative, no distress, appears stated age Head: Normocephalic, without obvious abnormality, atraumatic Eyes: PERRL, conjunctiva/corneas clear, EOM's intact    Neck: Supple, symmetrical, trachea midline Back: Symmetric, no curvature, ROM normal, no CVA tenderness Lungs:  respirations unlabored Heart: Regular rate and rhythm Abdomen: Soft, non-tender Extremities: Extremities normal, atraumatic, no cyanosis or edema Pulses: 2+ and symmetric all extremities Skin: Skin color, texture, turgor normal, no rashes or lesions  NEUROLOGIC:   Mental status: Alert and oriented x4,  no aphasia, good attention span, fund of knowledge, and memory Motor Exam - grossly normal Sensory Exam - grossly normal Reflexes: 1+ Coordination - grossly normal Gait - grossly normal Balance - grossly normal Cranial Nerves: I: smell Not tested  II: visual acuity  OS: nl    OD: nl  II: visual fields Full to confrontation  II: pupils Equal, round, reactive to light  III,VII: ptosis None  III,IV,VI: extraocular muscles  Full ROM  V: mastication Normal  V: facial light touch sensation  Normal  V,VII: corneal reflex  Present  VII: facial muscle function - upper  Normal  VII: facial muscle function - lower Normal  VIII: hearing Not tested  IX: soft palate elevation  Normal  IX,X: gag reflex Present  XI: trapezius strength  5/5  XI: sternocleidomastoid strength 5/5  XI: neck flexion strength  5/5  XII: tongue strength   Normal    Data Review Lab Results  Component Value Date   WBC 8.0 12/23/2022   HGB 13.0 12/23/2022   HCT 38.6 12/23/2022   MCV 93.0 12/23/2022   PLT 336 12/23/2022   Lab Results  Component Value Date   NA 140 12/23/2022   K 3.6 12/23/2022   CL 107 12/23/2022   CO2 24 12/23/2022   BUN 9 12/23/2022   CREATININE 1.36 (H) 12/23/2022   GLUCOSE 112 (H) 12/23/2022   Lab Results  Component Value Date   INR 1.0 12/23/2022    Assessment/Plan:  Estimated body mass index is 32.77 kg/m as calculated from the following:   Height as of this encounter: 5\' 6"  (1.676 m).   Weight as of this encounter: 92.1 kg. Patient admitted  for PLIF L4-5. Patient has failed a reasonable attempt at conservative therapy.  I explained the condition and procedure to the patient and answered any questions.  Patient wishes to proceed with procedure as planned. Understands risks/ benefits and typical outcomes of procedure.   Tia Alert 12/30/2022 7:41 AM

## 2022-12-30 NOTE — Progress Notes (Addendum)
PT Cancellation Note  Patient Details Name: Yolanda Weaver MRN: [id number] DOB: March 04, 1970   Cancelled Treatment:    Reason Eval/Treat Not Completed: (P) PT screened, no needs identified, will sign off. OT reports pt was independent with functional mobility, including ambulating and navigating a flight of stairs. No PT needs identified. Will sign off.   Virgil Benedict, PT, DPT Acute Rehabilitation Services  Office: [phone]    Bettina Gavia 12/30/2022, 4:37 PM

## 2022-12-31 LAB — TYPE AND SCREEN
ABO/RH(D): B NEG
Antibody Screen: POSITIVE
Donor AG Type: NEGATIVE
Donor AG Type: NEGATIVE
PT AG Type: NEGATIVE
Unit division: 0
Unit division: 0

## 2022-12-31 LAB — BPAM RBC
Blood Product Expiration Date: 202412032359
Blood Product Expiration Date: 202412062359
Unit Type and Rh: 1700
Unit Type and Rh: 9500

## 2023-02-03 ENCOUNTER — Ambulatory Visit (HOSPITAL_BASED_OUTPATIENT_CLINIC_OR_DEPARTMENT_OTHER): Payer: PRIVATE HEALTH INSURANCE

## 2023-02-03 ENCOUNTER — Other Ambulatory Visit: Payer: Self-pay | Admitting: Family Medicine

## 2023-02-03 DIAGNOSIS — I10 Essential (primary) hypertension: Secondary | ICD-10-CM

## 2023-02-07 ENCOUNTER — Ambulatory Visit (INDEPENDENT_AMBULATORY_CARE_PROVIDER_SITE_OTHER): Payer: Self-pay | Admitting: Orthopaedic Surgery

## 2023-02-07 ENCOUNTER — Encounter: Payer: Self-pay | Admitting: Orthopaedic Surgery

## 2023-02-07 ENCOUNTER — Other Ambulatory Visit: Payer: Self-pay

## 2023-02-07 VITALS — BP 142/87 | HR 105

## 2023-02-07 DIAGNOSIS — M25561 Pain in right knee: Secondary | ICD-10-CM | POA: Diagnosis not present

## 2023-02-09 DIAGNOSIS — M25561 Pain in right knee: Secondary | ICD-10-CM

## 2023-02-09 MED ORDER — LIDOCAINE HCL 1 % IJ SOLN
0.5000 mL | INTRAMUSCULAR | Status: AC | PRN
  Administered 2023-02-09: .5 mL

## 2023-02-09 MED ORDER — BUPIVACAINE HCL 0.25 % IJ SOLN
4.0000 mL | INTRAMUSCULAR | Status: AC | PRN
  Administered 2023-02-09: 4 mL via INTRA_ARTICULAR

## 2023-02-09 MED ORDER — METHYLPREDNISOLONE ACETATE 40 MG/ML IJ SUSP
40.0000 mg | INTRAMUSCULAR | Status: AC | PRN
  Administered 2023-02-09: 40 mg via INTRA_ARTICULAR

## 2023-02-09 NOTE — Progress Notes (Signed)
 Office Visit Note   Patient: Yolanda Weaver           Date of Birth: 1970/07/25           MRN: [id number] Visit Date: 02/07/2023              Requested by: Joyce Norleen BROCKS, MD [address] [place],  [place] [address] PCP: Joyce Norleen BROCKS, MD   Assessment & Plan: Visit Diagnoses:  1. Acute pain of right knee     Plan: Right knee injection performed she tolerated well.  She noticed some improvement in her pain symptoms and gait postinjection.  Follow-Up Instructions: No follow-ups on file.   Orders:  Orders Placed This Encounter  Procedures   XR Knee 1-2 Views Right   No orders of the defined types were placed in this encounter.     Procedures: Large Joint Inj: R knee on 02/09/2023 9:08 AM Indications: pain and joint swelling Details: 22 G 1.5 in needle, anterolateral approach  Arthrogram: No  Medications: 40 mg methylPREDNISolone  acetate 40 MG/ML; 0.5 mL lidocaine  1 %; 4 mL bupivacaine  0.25 % Outcome: tolerated well, no immediate complications Procedure, treatment alternatives, risks and benefits explained, specific risks discussed. Consent was given by the patient. Immediately prior to procedure a time out was called to verify the correct patient, procedure, equipment, support staff and site/side marked as required. Patient was prepped and draped in the usual sterile fashion.       Clinical Data: No additional findings.   Subjective: Chief Complaint  Patient presents with   Right Knee - Pain    HPI 53 year old females had previous knee arthroscopy several times since she was a teenager is here with sharp pain in her knee since she was doing some stretching and night felt a sharp pop in her knee and then had swelling and pain.  She had back surgery 12/30/2022 which is a fusion with Dr. Joshua.  Knee has been swollen keeping her awake at night she is ambulating and limping and she is requesting that knee injection.  Past history had posterior medial meniscal  tear and lateral meniscal tear with some chondromalacia.  No history of gout.  Review of Systems previous cervical and lumbar fusions.  Positive for smoking.  All other systems noncontributory to HPI.   Objective: Vital Signs: BP (!) 142/87   Pulse (!) 105   Physical Exam Constitutional:      Appearance: She is well-developed.  HENT:     Head: Normocephalic.     Right Ear: External ear normal.     Left Ear: External ear normal. There is no impacted cerumen.  Eyes:     Pupils: Pupils are equal, round, and reactive to light.  Neck:     Thyroid : No thyromegaly.     Trachea: No tracheal deviation.  Cardiovascular:     Rate and Rhythm: Normal rate.  Pulmonary:     Effort: Pulmonary effort is normal.  Abdominal:     Palpations: Abdomen is soft.  Musculoskeletal:     Cervical back: No rigidity.  Skin:    General: Skin is warm and dry.  Neurological:     Mental Status: She is alert and oriented to person, place, and time.  Psychiatric:        Behavior: Behavior normal.     Ortho Exam 2+ knee effusion.  Some medial joint line tenderness no palpable medial plica positive patellar grind test.  Lateral ligaments are stable ACL PCL exam  is normal negative logroll hips.  She is ambulatory with the right knee limp.  Specialty Comments:  No specialty comments available.  Imaging: No results found.   PMFS History: Patient Active Problem List   Diagnosis Date Noted   S/P lumbar fusion 12/30/2022   Right knee meniscal tear 05/16/2022   Smoker 05/10/2022   Obesity (BMI 30-39.9) 05/10/2022   Menopausal syndrome 05/02/2022   Atrophic vaginitis 05/02/2022   Hot flashes 03/16/2022   Chondromalacia patellae, right knee 02/22/2022   Essential hypertension 09/22/2021   Vitamin B12 deficiency 09/22/2021   S/P cervical spinal fusion 08/31/2016   S/P lumbar spinal fusion 01/15/2015   Hypothyroid 07/01/2010   Migraine headache 07/01/2010   Bipolar disorder (HCC) 07/01/2010    Allergic rhinitis 07/01/2010   Past Medical History:  Diagnosis Date   Acne    Allergy    Anxiety    Bipolar 1 disorder (HCC)    Blood transfusion    h/o tx for rh incompatibility during pregnancy. see note re significant antibody   Contraception    Depression    Hypertension    Hypothyroid    Mental disorder    bipolar   Migraine    Obesity    PONV (postoperative nausea and vomiting)    Ptosis, right eyelid    Recurrent upper respiratory infection (URI)    reports she had bronchitis, treated /w antibiotics in past month    Family History  Problem Relation Age of Onset   COPD Father    Cancer Other    Hypertension Other    Diabetes Other    Anesthesia problems Neg Hx    Hypotension Neg Hx    Malignant hyperthermia Neg Hx    Pseudochol deficiency Neg Hx     Past Surgical History:  Procedure Laterality Date   DIAGNOSTIC LAPAROSCOPY     evaluation for endometriosis   FOOT FOREIGN BODY REMOVAL     KNEE ARTHROSCOPY     x3 r   LUMBAR FUSION  01/15/2015   LUMBAR LAMINECTOMY  04/08/2011   Procedure: MICRODISCECTOMY LUMBAR LAMINECTOMY;  Surgeon: Oneil JAYSON Herald, MD;  Location: [facility] OR;  Service: Orthopedics;  Laterality: Right;  Right L5-S1 Microdiscectomy   MAXIMUM ACCESS (MAS)POSTERIOR LUMBAR INTERBODY FUSION (PLIF) 1 LEVEL N/A 01/15/2015   Procedure: LUMBAR FIVE-SACRA L-ONE MAXIMUM ACCESS SURGERY POSTERIOR LUMBAR INTERBODY FUSION ;  Surgeon: Alm GORMAN Molt, MD;  Location: [facility];  Service: Neurosurgery;  Laterality: N/A;   NECK SURGERY  07/21/2016   fusion of C4 an C5   THYROIDECTOMY, PARTIAL     right lobe hurthle cell adenoma   Social History   Occupational History   Not on file  Tobacco Use   Smoking status: Every Day    Types: Cigarettes    Start date: 2003   Smokeless tobacco: Never   Tobacco comments:    varies  Vaping Use   Vaping status: Never Used  Substance and Sexual Activity   Alcohol use: Yes    Comment: occ   Drug use: No   Sexual activity:  Not on file

## 2023-03-23 ENCOUNTER — Ambulatory Visit: Payer: PRIVATE HEALTH INSURANCE | Admitting: Family Medicine

## 2023-03-23 ENCOUNTER — Telehealth: Payer: PRIVATE HEALTH INSURANCE | Admitting: Medical

## 2023-03-23 VITALS — Temp 101.0°F | Wt 212.0 lb

## 2023-03-23 DIAGNOSIS — Z20828 Contact with and (suspected) exposure to other viral communicable diseases: Secondary | ICD-10-CM

## 2023-03-23 DIAGNOSIS — Z87891 Personal history of nicotine dependence: Secondary | ICD-10-CM

## 2023-03-23 DIAGNOSIS — R6889 Other general symptoms and signs: Secondary | ICD-10-CM

## 2023-03-23 DIAGNOSIS — R052 Subacute cough: Secondary | ICD-10-CM

## 2023-03-23 MED ORDER — BENZONATATE 200 MG PO CAPS
200.0000 mg | ORAL_CAPSULE | Freq: Three times a day (TID) | ORAL | 0 refills | Status: DC | PRN
Start: 2023-03-23 — End: 2023-04-26

## 2023-03-23 MED ORDER — OSELTAMIVIR PHOSPHATE 75 MG PO CAPS: 75.0000 mg | ORAL_CAPSULE | Freq: Two times a day (BID) | ORAL | 0 refills | Status: DC

## 2023-03-23 MED ORDER — ALBUTEROL SULFATE HFA 108 (90 BASE) MCG/ACT IN AERS: INHALATION_SPRAY | RESPIRATORY_TRACT | 0 refills | Status: AC

## 2023-03-23 NOTE — Progress Notes (Signed)
Subjective:     Patient ID: Yolanda Weaver, female   DOB: 1970/10/18, 53 y.o.   MRN: [id number]  This visit type was conducted due to national recommendations for restrictions regarding the COVID-19 Pandemic (e.g. social distancing) in an effort to limit this patient's exposure and mitigate transmission in our community.  Due to their co-morbid illnesses, this patient is at least at moderate risk for complications without adequate follow up.  This format is felt to be most appropriate for this patient at this time.    Documentation for virtual audio and video telecommunications through [facility] encounter:  The patient was located at home. The provider was located in the office. The patient did consent to this visit and is aware of possible charges through their insurance for this visit.  The other persons participating in this telemedicine service were none. Time spent on call was 20 minutes and in review of previous records 20 minutes total.  This virtual service is not related to other E/M service within previous 7 days.   HPI Chief Complaint  Patient presents with   flu like symptoms    Symptoms- cough with phelgm, fever 101 this morning, last night 103.2, ear pain, face pain   Virtual consult for illness.  Symptoms began last night with flu symptoms.  But has had cough for 3 weeks.   She notes body aches, chills, fever, ear pain, facial pain.  Cough with phlegm.  No NVD.   Feels some SOB.  Feels stuffy in head.  Appetite is down.  Water intake is ok.  Been around several flu contacts.   No hx/o asthma or lung disease.  Former smoker, quit in november 2024.  Gets bronchitis every years.    Using  nothing so far.   No other aggravating or relieving factors. No other complaint.  Past Medical History:  Diagnosis Date   Acne    Allergy    Anxiety    Bipolar 1 disorder (HCC)    Blood transfusion    h/o tx for rh incompatibility during pregnancy. see note re significant antibody    Contraception    Depression    Hypertension    Hypothyroid    Mental disorder    bipolar   Migraine    Obesity    PONV (postoperative nausea and vomiting)    Ptosis, right eyelid    Recurrent upper respiratory infection (URI)    reports she had bronchitis, treated /w antibiotics in past month   Current Outpatient Medications on File Prior to Visit  Medication Sig Dispense Refill   estradiol (ESTRACE) 0.1 MG/GM vaginal cream Place 1 g vaginally once a week.     guaifenesin (ROBITUSSIN) 100 MG/5ML syrup Take 200 mg by mouth 3 (three) times daily as needed for cough.     ibuprofen (ADVIL) 800 MG tablet Take 800 mg by mouth every 8 (eight) hours as needed.     lamoTRIgine (LAMICTAL) 200 MG tablet TAKE 1 AND 1/2 TABLETS DAILY BY MOUTH AT BEDTIME (Patient taking differently: Take 200 mg by mouth at bedtime.) 135 tablet 3   levonorgestrel (MIRENA) 20 MCG/24HR IUD 1 each by Intrauterine route once.     lisinopril-hydrochlorothiazide (ZESTORETIC) 10-12.5 MG tablet TAKE 1 TABLET BY MOUTH EVERY DAYLISINOPRIL 10 MG-HYDROCHLOROTHIAZIDE 12.5 MG TABLET TAKE 1 TABLET BY MOUTH EVERY DAY 90 tablet 1   methocarbamol (ROBAXIN) 500 MG tablet Take 1 tablet (500 mg total) by mouth every 6 (six) hours as needed for muscle spasms. 45 tablet  0   Phenylephrine-APAP-guaiFENesin (SEVERE SINUS CONGESTION & PAIN PO) Take 2 tablets by mouth every 6 (six) hours as needed (congestion).     tiZANidine (ZANAFLEX) 4 MG tablet Take 1 tablet (4 mg total) by mouth every 6 (six) hours as needed for muscle spasms. 30 tablet 0   No current facility-administered medications on file prior to visit.    Review of Systems As in subjective    Objective:   Physical Exam Due to coronavirus pandemic stay at home measures, patient visit was virtual and they were not examined in person.   Temp (!) 101 F (38.3 C)   Wt 212 lb (96.2 kg)   BMI 34.22 kg/m   Gen: wd, wn, nad, ill appearing No labored breathing or wheezing      Assessment:     Encounter Diagnoses  Name Primary?   Flu-like symptoms Yes   Exposure to the flu    Subacute cough    Former smoker        Plan:     Discussed concerns, symptoms. Given community flu burden and current clinical picture, we will treat empirically for flu.  Prescription given for Tamiflu, discussed risks/benefits of medication.    Discussed diagnosis of influenza. Discussed supportive care including rest, hydration, OTC Tylenol or NSAID for fever, aches, and malaise.  Discussed period of contagion, self quarantine at home away from others to avoid spread of disease, discussed means of transmission, and possible complications including pneumonia.  If worse or not improving within the next 4-5 days, then call or return.  Patient voiced understanding of diagnosis, recommendations, and treatment plan.  Yolanda Weaver was seen today for flu like symptoms.  Diagnoses and all orders for this visit:  Flu-like symptoms  Exposure to the flu  Subacute cough  Former smoker  Other orders -     benzonatate (TESSALON) 200 MG capsule; Take 1 capsule (200 mg total) by mouth 3 (three) times daily as needed for cough. -     albuterol (VENTOLIN HFA) 108 (90 Base) MCG/ACT inhaler; TAKE 2 PUFFS BY MOUTH EVERY 6 HOURS AS NEEDED FOR WHEEZE OR SHORTNESS OF BREATH -     oseltamivir (TAMIFLU) 75 MG capsule; Take 1 capsule (75 mg total) by mouth 2 (two) times daily.    F/u prn

## 2023-04-04 ENCOUNTER — Encounter: Payer: Self-pay | Admitting: Internal Medicine

## 2023-04-12 ENCOUNTER — Encounter: Payer: Self-pay | Admitting: Family Medicine

## 2023-04-26 ENCOUNTER — Encounter: Payer: Self-pay | Admitting: Family Medicine

## 2023-04-26 ENCOUNTER — Ambulatory Visit: Payer: PRIVATE HEALTH INSURANCE | Admitting: Family Medicine

## 2023-04-26 VITALS — BP 134/72 | HR 80 | Ht 66.5 in | Wt 213.0 lb

## 2023-04-26 DIAGNOSIS — E669 Obesity, unspecified: Secondary | ICD-10-CM

## 2023-04-26 NOTE — Progress Notes (Signed)
   Subjective:    Patient ID: Yolanda Weaver, female    DOB: 1970-07-17, 53 y.o.   MRN: [id number]  HPI She is here to discuss elevated blood sugars.  She is being cared for for weight loss and presently is using Suboxone.  Blood work was done and she brought it with her.   Review of Systems     Objective:    Physical Exam Alert and in no distress otherwise not examined The blood work was reviewed.  She did have a slightly low phosphorus of 2.9.  Her thyroid function test was normal complete metabolic was also normal.  LDL was slightly elevated at 116.  Hemoglobin A1c was done which was 5.5.  This blood work was done in July of last year.       Assessment & Plan:  Obesity (BMI 30-39.9) I explained that at this time there is no evidence of diabetes and the fact that she is going on a weight loss program should help with this with future testing.  I reassured her the blood work at this time looks really pretty good.  She will follow-up here on an as-needed basis.

## 2023-05-19 ENCOUNTER — Emergency Department (HOSPITAL_BASED_OUTPATIENT_CLINIC_OR_DEPARTMENT_OTHER)
Admission: EM | Admit: 2023-05-19 | Discharge: 2023-05-19 | Disposition: A | Discharge status: Home / Self Care | Payer: Self-pay | Attending: Emergency Medicine | Admitting: Emergency Medicine

## 2023-05-19 ENCOUNTER — Encounter (HOSPITAL_BASED_OUTPATIENT_CLINIC_OR_DEPARTMENT_OTHER): Payer: Self-pay | Admitting: Emergency Medicine

## 2023-05-19 ENCOUNTER — Other Ambulatory Visit: Payer: Self-pay

## 2023-05-19 ENCOUNTER — Emergency Department (HOSPITAL_BASED_OUTPATIENT_CLINIC_OR_DEPARTMENT_OTHER): Payer: Self-pay

## 2023-05-19 DIAGNOSIS — R079 Chest pain, unspecified: Secondary | ICD-10-CM | POA: Diagnosis present

## 2023-05-19 DIAGNOSIS — K219 Gastro-esophageal reflux disease without esophagitis: Secondary | ICD-10-CM | POA: Diagnosis not present

## 2023-05-19 DIAGNOSIS — Z91040 Latex allergy status: Secondary | ICD-10-CM | POA: Diagnosis not present

## 2023-05-19 DIAGNOSIS — R0602 Shortness of breath: Secondary | ICD-10-CM | POA: Diagnosis not present

## 2023-05-19 LAB — CBC
HCT: 37.9 % (ref 36.0–46.0)
Hemoglobin: 12.9 g/dL (ref 12.0–15.0)
MCH: 30.4 pg (ref 26.0–34.0)
MCHC: 34 g/dL (ref 30.0–36.0)
MCV: 89.2 fL (ref 80.0–100.0)
Platelets: 346 10*3/uL (ref 150–400)
RBC: 4.25 MIL/uL (ref 3.87–5.11)
RDW: 13 % (ref 11.5–15.5)
WBC: 9 10*3/uL (ref 4.0–10.5)
nRBC: 0 % (ref 0.0–0.2)

## 2023-05-19 LAB — TROPONIN I (HIGH SENSITIVITY)
Troponin I (High Sensitivity): <2 ng/L (ref <18)
Troponin I (High Sensitivity): <2 ng/L (ref <18)

## 2023-05-19 LAB — BASIC METABOLIC PANEL WITH GFR
Anion gap: 9 (ref 5–15)
BUN: 14 mg/dL (ref 6–20)
CO2: 26 mmol/L (ref 22–32)
Calcium: 9.4 mg/dL (ref 8.9–10.3)
Chloride: 106 mmol/L (ref 98–111)
Creatinine, Ser: 1 mg/dL (ref 0.44–1.00)
GFR, Estimated: >60 mL/min (ref >60)
Glucose, Bld: 95 mg/dL (ref 70–99)
Potassium: 3.7 mmol/L (ref 3.5–5.1)
Sodium: 141 mmol/L (ref 135–145)

## 2023-05-19 LAB — PREGNANCY, URINE: Preg Test, Ur: NEGATIVE

## 2023-05-19 LAB — D-DIMER, QUANTITATIVE: D-Dimer, Quant: 0.5 ug{FEU}/mL (ref 0.00–0.50)

## 2023-05-19 MED ORDER — ALUM & MAG HYDROXIDE-SIMETH 200-200-20 MG/5ML PO SUSP
30.0000 mL | Freq: Once | ORAL | Status: AC
  Administered 2023-05-19: 30 mL via ORAL
  Filled 2023-05-19: qty 30

## 2023-05-19 NOTE — ED Notes (Signed)
 Pt. Reports she has had chest pain today 5/10 with shob.  Pt. Reports she stopped her reflux meds due to stomach upset.  Pt. In no distress.

## 2023-05-19 NOTE — ED Provider Notes (Signed)
 [place] [facility] Provider Note   CSN: [id number] Arrival date & time: 05/19/23  1201     History Chief Complaint  Patient presents with   Chest Pain    Yolanda Weaver is a 53 y.o. female.  Patient with past history significant for GERD, migraines presents the emergency department today with concerns of chest pain/tightness as well as heartburn for last 3 days.  Reports she took aspirin yesterday as well as apple cider vinegar attempted to treat her GERD symptoms with some improvement.  Endorsing some worsening shortness of breath primarily with exertion.  Denies any history of congestive heart failure, PEs, DVTs, and is not currently anticoagulated.   Chest Pain      Home Medications Prior to Admission medications   Medication Sig Start Date End Date Taking? Authorizing Provider  albuterol (VENTOLIN HFA) 108 (90 Base) MCG/ACT inhaler TAKE 2 PUFFS BY MOUTH EVERY 6 HOURS AS NEEDED FOR WHEEZE OR SHORTNESS OF BREATH Patient not taking: Reported on 04/26/2023 03/23/23   Tysinger, Christiane Cowing, PA-C  estradiol (ESTRACE) 0.1 MG/GM vaginal cream Place 1 g vaginally once a week. 12/16/22   [provider]  ibuprofen (ADVIL) 800 MG tablet Take 800 mg by mouth every 8 (eight) hours as needed. Patient not taking: Reported on 04/26/2023    [provider]  lamoTRIgine (LAMICTAL) 200 MG tablet TAKE 1 AND 1/2 TABLETS DAILY BY MOUTH AT BEDTIME Patient taking differently: Take 200 mg by mouth at bedtime. 03/16/22   Lalonde, John C, MD  levonorgestrel (MIRENA) 20 MCG/24HR IUD 1 each by Intrauterine route once.    [provider]  lisinopril-hydrochlorothiazide (ZESTORETIC) 10-12.5 MG tablet TAKE 1 TABLET BY MOUTH EVERY DAYLISINOPRIL 10 MG-HYDROCHLOROTHIAZIDE 12.5 MG TABLET TAKE 1 TABLET BY MOUTH EVERY DAY 02/03/23   Lalonde, John C, MD  methocarbamol (ROBAXIN) 500 MG tablet Take 1 tablet (500 mg total) by mouth every 6 (six) hours as needed for  muscle spasms. Patient not taking: Reported on 04/26/2023 12/30/22   Meyran, Kimberly Hannah, NP  NALTREXONE HCL PO Take 3 mg by mouth daily.    [provider]  tiZANidine (ZANAFLEX) 4 MG tablet Take 1 tablet (4 mg total) by mouth every 6 (six) hours as needed for muscle spasms. Patient not taking: Reported on 04/26/2023 12/30/22   Meyran, Kimberly Hannah, NP      Allergies    Latex, Codeine, Diphenhydramine, Tramadol, Acetaminophen, Dilaudid [hydromorphone hcl], Erythromycin, Hydrocodone, Meloxicam, and Oxycodone    Review of Systems   Review of Systems  Cardiovascular:  Positive for chest pain.  All other systems reviewed and are negative.   Physical Exam Updated Vital Signs BP 134/80   Pulse 75   Temp 98 F (36.7 C) (Oral)   Resp 16   Wt 97.5 kg   LMP  (Exact Date)   SpO2 96%   BMI 34.18 kg/m  Physical Exam Vitals and nursing note reviewed.  Constitutional:      General: She is not in acute distress.    Appearance: She is well-developed.  HENT:     Head: Normocephalic and atraumatic.  Eyes:     Conjunctiva/sclera: Conjunctivae normal.  Cardiovascular:     Rate and Rhythm: Normal rate and regular rhythm.     Heart sounds: No murmur heard. Pulmonary:     Effort: Pulmonary effort is normal. No respiratory distress.     Breath sounds: Normal breath sounds. No decreased breath sounds, wheezing or rhonchi.  Abdominal:  Palpations: Abdomen is soft.     Tenderness: There is no abdominal tenderness.  Musculoskeletal:        General: No swelling.     Cervical back: Neck supple.  Skin:    General: Skin is warm and dry.     Capillary Refill: Capillary refill takes less than 2 seconds.  Neurological:     Mental Status: She is alert.  Psychiatric:        Mood and Affect: Mood normal.     ED Results / Procedures / Treatments   Labs (all labs ordered are listed, but only abnormal results are displayed) Labs Reviewed  BASIC METABOLIC PANEL WITH GFR  CBC   PREGNANCY, URINE  D-DIMER, QUANTITATIVE  TROPONIN I (HIGH SENSITIVITY)  TROPONIN I (HIGH SENSITIVITY)    EKG EKG Interpretation Date/Time:  Friday May 19 2023 12:10:21 EDT Ventricular Rate:  77 PR Interval:  136 QRS Duration:  96 QT Interval:  382 QTC Calculation: 432 R Axis:   26  Text Interpretation: Normal sinus rhythm with sinus arrhythmia Normal ECG When compared with ECG of 23-Dec-2022 11:25, PREVIOUS ECG IS PRESENT No significant change since last tracing Confirmed by Zackowski, Scott [phone]) on 05/19/2023 3:15:36 PM  Radiology DG Chest 2 View Result Date: 05/19/2023 CLINICAL DATA:  Chest pain. EXAM: CHEST - 2 VIEW COMPARISON:  Chest two views 08/27/2019 FINDINGS: Cardiac silhouette and mediastinal contours are within normal limits. The lungs are clear. No pleural effusion or pneumothorax. Surgical clips are again seen overlying the inferior right neck. Mild dextrocurvature of the upper lumbar spine. Moderate multilevel degenerative disc changes of the mid to upper thoracic spine. IMPRESSION: No active cardiopulmonary disease. Electronically Signed   By: Bertina Broccoli M.D.   On: 05/19/2023 14:37    Procedures Procedures    Medications Ordered in ED Medications  alum & mag hydroxide-simeth (MAALOX/MYLANTA) 200-200-20 MG/5ML suspension 30 mL (30 mLs Oral Given 05/19/23 1512)    ED Course/ Medical Decision Making/ A&P                                 Medical Decision Making Amount and/or Complexity of Data Reviewed Labs: ordered. Radiology: ordered.  Risk OTC drugs.   This patient presents to the ED for concern of chest pain.  Differential diagnosis includes ACS, PE, pneumonia, bronchitis, GERD   Lab Tests:  I Ordered, and personally interpreted labs.  The pertinent results include: CBC unremarkable, BMP unremarkable, troponin less than 2, urine pregnancy negative, D-dimer negative at 0.5   Imaging Studies ordered:  I ordered imaging studies including chest  x-ray I independently visualized and interpreted imaging which showed no acute cardiopulmonary process seen I agree with the radiologist interpretation   Medicines ordered and prescription drug management:  I ordered medication including Maalox for GERD Reevaluation of the patient after these medicines showed that the patient improved I have reviewed the patients home medicines and have made adjustments as needed   Problem List / ED Course:  Patient with past history significant for GERD and migraines presents to the emergency department today with concerns of chest pain.  She reports a chest tightness in the mid chest as well as heartburn sensation for the last 3 days.  Reports took aspirin yesterday with some relief in symptoms.  Has a history of GERD but not currently taking PPIs or H2 RA's due to stating that "most medications irritate stomach".  She also endorses  some mild shortness of breath that she has noted on exertion.  No history of CHF.  Has not noticed any leg swelling. On exam, no abnormal finding seen.  Heart and lung sounds unremarkable.  No lower extremity edema.  Vitals reassuring with no evident tachycardia, hypotension, fever, or hypoxia. Workup reassuring.  Troponin negative with delta troponin still remaining negative.  D-dimer negative at 0.50.  Lab work otherwise unremarkable.  On reassessment after Maalox, patient reports improvement with her discomfort but still present. Unclear cause at this time of her current symptoms.  No evidence to suggest CHF as there is no signs of fluid overload on clinical examination or on chest x-ray.  Advised continued management of GERD type symptoms with over-the-counter medications as tolerated.  Also encourage diet modification to reduce discomfort with reflux symptoms.  Advised patient if symptoms not improving, outpatient follow-up with a cardiologist may be recommended.  Patient otherwise stable for outpatient follow-up and agreeable  with current plans.  Discharged home in stable condition.   Social Determinants of Health:    Final Clinical Impression(s) / ED Diagnoses Final diagnoses:  Chest pain of unknown etiology  Gastroesophageal reflux disease, unspecified whether esophagitis present    Rx / DC Orders ED Discharge Orders     None         Concetta Dee, PA-C 05/19/23 1609    Almond Army, MD 05/20/23 [phone]

## 2023-05-19 NOTE — Discharge Instructions (Signed)
 You were seen in the ER today for concerns of chest pain. Your labs and imaging were thankfully reassuring with no abnormal findings seen. I am unsure what is causing your current chest pain symptoms. I would strongly advise following up with your primary care provider for further assessment and return for any concerns of new or worsening symptoms.

## 2023-05-19 NOTE — ED Notes (Signed)
 ED Provider at bedside.

## 2023-05-19 NOTE — ED Triage Notes (Signed)
 Mid chest tightness and heartburns x 3 days . Took aspirin and yesterday with some relief . Hx GERD. Shortness of breath today . Hx HTN

## 2023-06-10 ENCOUNTER — Other Ambulatory Visit: Payer: Self-pay | Admitting: Family Medicine

## 2023-06-10 DIAGNOSIS — F3170 Bipolar disorder, currently in remission, most recent episode unspecified: Secondary | ICD-10-CM

## 2023-08-04 ENCOUNTER — Other Ambulatory Visit: Payer: Self-pay | Admitting: Family Medicine

## 2023-08-04 DIAGNOSIS — I10 Essential (primary) hypertension: Secondary | ICD-10-CM

## 2023-08-25 ENCOUNTER — Encounter: Payer: Self-pay | Admitting: Family Medicine

## 2023-08-25 DIAGNOSIS — E669 Obesity, unspecified: Secondary | ICD-10-CM

## 2023-09-04 ENCOUNTER — Other Ambulatory Visit: Payer: Self-pay | Admitting: Medical

## 2023-09-04 MED ORDER — ZEPBOUND 2.5 MG/0.5ML ~~LOC~~ SOAJ: 2.5000 mg | SUBCUTANEOUS | 0 refills | Status: DC

## 2023-09-26 MED ORDER — TIRZEPATIDE-WEIGHT MANAGEMENT 5 MG/0.5ML ~~LOC~~ SOLN: 5.0000 mg | SUBCUTANEOUS | 0 refills | Status: DC

## 2023-09-27 ENCOUNTER — Other Ambulatory Visit: Payer: Self-pay

## 2023-09-27 DIAGNOSIS — E669 Obesity, unspecified: Secondary | ICD-10-CM

## 2023-09-27 MED ORDER — TIRZEPATIDE-WEIGHT MANAGEMENT 5 MG/0.5ML ~~LOC~~ SOAJ: 5.0000 mg | SUBCUTANEOUS | 0 refills | Status: DC

## 2023-09-27 MED ORDER — TIRZEPATIDE-WEIGHT MANAGEMENT 5 MG/0.5ML ~~LOC~~ SOLN: 5.0000 mg | SUBCUTANEOUS | 5 refills | Status: DC

## 2023-10-03 ENCOUNTER — Other Ambulatory Visit: Payer: Self-pay | Admitting: *Deleted

## 2023-10-03 DIAGNOSIS — E669 Obesity, unspecified: Secondary | ICD-10-CM

## 2023-10-03 MED ORDER — TIRZEPATIDE-WEIGHT MANAGEMENT 5 MG/0.5ML ~~LOC~~ SOLN: 5.0000 mg | SUBCUTANEOUS | 0 refills | Status: DC

## 2023-10-19 ENCOUNTER — Encounter: Payer: Self-pay | Admitting: Family Medicine

## 2023-10-19 NOTE — Telephone Encounter (Signed)
 I have put the letter and copy of prescriptions she has requested in your blue folder for when you get back to sign.

## 2023-10-25 ENCOUNTER — Telehealth: Payer: Self-pay | Admitting: Family Medicine

## 2023-10-25 NOTE — Telephone Encounter (Signed)
 Called pt regarding Zepbound  refill, she is doing well on 5mg  so I asked if she wanted to stay on 5mg  or go up & she would like to go up to next dose,  ok per Dr. Joyce, increased her to Zepbound  7.5mg  & completed [employer] form & faxed in

## 2023-11-27 ENCOUNTER — Other Ambulatory Visit: Payer: Self-pay | Admitting: Family Medicine

## 2023-11-27 ENCOUNTER — Encounter: Payer: Self-pay | Admitting: Family Medicine

## 2023-11-27 DIAGNOSIS — E669 Obesity, unspecified: Secondary | ICD-10-CM

## 2023-11-27 MED ORDER — ZEPBOUND 5 MG/0.5ML ~~LOC~~ SOAJ: 5.0000 mg | SUBCUTANEOUS | 1 refills | Status: DC

## 2023-11-27 MED ORDER — TIRZEPATIDE-WEIGHT MANAGEMENT 5 MG/0.5ML ~~LOC~~ SOLN: 5.0000 mg | SUBCUTANEOUS | 0 refills | Status: DC

## 2023-11-30 ENCOUNTER — Telehealth: Payer: Self-pay | Admitting: Family Medicine

## 2023-11-30 NOTE — Telephone Encounter (Signed)
 Form was completed for [employer] Zepbound  5mg  with 5 refills this was faxed

## 2023-12-11 ENCOUNTER — Encounter: Payer: Self-pay | Admitting: Radiology

## 2023-12-28 ENCOUNTER — Ambulatory Visit: Payer: PRIVATE HEALTH INSURANCE | Admitting: Family Medicine

## 2023-12-28 VITALS — BP 122/80 | HR 75 | Wt 193.0 lb

## 2023-12-28 DIAGNOSIS — E559 Vitamin D deficiency, unspecified: Secondary | ICD-10-CM | POA: Diagnosis not present

## 2023-12-28 DIAGNOSIS — E669 Obesity, unspecified: Secondary | ICD-10-CM | POA: Diagnosis not present

## 2023-12-28 DIAGNOSIS — R7989 Other specified abnormal findings of blood chemistry: Secondary | ICD-10-CM | POA: Diagnosis not present

## 2023-12-28 DIAGNOSIS — K219 Gastro-esophageal reflux disease without esophagitis: Secondary | ICD-10-CM

## 2023-12-28 MED ORDER — OMEPRAZOLE 20 MG PO CPDR: 20.0000 mg | DELAYED_RELEASE_CAPSULE | Freq: Every day | ORAL | 3 refills | Status: AC

## 2023-12-28 NOTE — Progress Notes (Signed)
 Really excellent blood pressures report he is here for heart rate wife set up Medrol  for lower what he did today with his yeah you get he did good to good probably doing it and less of his wife being will last night but not not any other time maybe on the 12th yeah he was not here for that right that he what ever that spread almost totally okay good for them to look at him into put out the him okay differential shit what you doing reading and what this is 7.2 so you have you think about overall not terrible yeah normal albeit intermittent  Subjective:    Patient ID: Yolanda Weaver, female    DOB: 02/07/1971, 53 y.o.   MRN: [id number]  Discussed the use of AI scribe software for clinical note transcription with the patient, who gave verbal consent to proceed.  History of Present Illness   Yolanda Weaver is a 53 year old female who presents with adverse reactions to Zepbound .  She has been experiencing adverse reactions to Zepbound , which she takes for weight management. Initially, she started on a 2.5 mg dose, then increased to 5 mg, and subsequently to 7.5 mg. After missing a dose while traveling, she resumed with 7.5 mg and experienced severe nausea, vomiting, diarrhea, and chills. These symptoms occurred each time she took the 7.5 mg dose, lasting about a day and a half. She has since reduced the dose back to 5 mg, but continues to experience nausea and chills, though less severe. She has taken four doses of 5 mg since November 29, 2023.  She has experienced significant weight loss, going from 216 lbs to 193 lbs. However, she notes that the rate of weight loss has slowed since reducing the dose to 5 mg.  She has a history of GERD and takes over-the-counter omeprazole  20 mg daily. She experiences worsening symptoms if she skips a dose or eats before bedtime. Avoiding food within two hours of bedtime helps manage her symptoms.  She mentions a past creatinine level of 1.1, which was noted as slightly  elevated, and a vitamin D  level of 24, indicating insufficiency. She takes a multivitamin containing vitamin D . She acknowledges being dehydrated and is trying to improve her fluid intake.           Review of Systems     Objective:    Physical Exam MEASUREMENTS: Weight- 193, BMI- 30.68.     Alert and in no distress otherwise not examined       Assessment & Plan:     Obesity with adverse effects of weight loss medication (Zepbound ) Adverse effects from Zepbound , including nausea, vomiting, diarrhea, and chills, outweigh benefits despite weight loss. Discussed research study participation for alternative medications. - Sent information to research company for potential participation in weight loss study. - Continue Zepbound  at 5 mg until further notice. - Consider Dramamine for nausea if increasing dose to 7.5 mg.  Gastroesophageal reflux disease (GERD) Significant reflux symptoms managed with Prilosec. Symptoms worsen with missed doses or eating before bedtime. - Prescribed Prilosec 20 mg daily for 90 days with three refills. - Advised to avoid eating within two hours of bedtime.  Vitamin D  insufficiency Vitamin D  level slightly below normal. Current level not concerning, no immediate intervention needed. - Continue current multivitamin with vitamin D . - Re-evaluate vitamin D  levels in the future if necessary.

## 2024-01-01 ENCOUNTER — Encounter: Payer: Self-pay | Admitting: Family Medicine

## 2024-01-03 MED ORDER — TIRZEPATIDE-WEIGHT MANAGEMENT 7.5 MG/0.5ML ~~LOC~~ SOLN: 7.5000 mg | SUBCUTANEOUS | 0 refills | Status: DC

## 2024-01-03 NOTE — Telephone Encounter (Signed)
 I attempted to get her involved in a research project for weight loss however she did not qualify.  I will place her back on Zepbound  7.5 mg.  She did have some side effects from it but states that she plans to try and work her way through it.

## 2024-01-09 ENCOUNTER — Other Ambulatory Visit: Payer: Self-pay

## 2024-01-09 MED ORDER — TIRZEPATIDE-WEIGHT MANAGEMENT 7.5 MG/0.5ML ~~LOC~~ SOLN: 7.5000 mg | SUBCUTANEOUS | 0 refills | Status: DC

## 2024-01-29 ENCOUNTER — Other Ambulatory Visit: Payer: Self-pay | Admitting: Family Medicine

## 2024-03-10 ENCOUNTER — Encounter: Payer: Self-pay | Admitting: Family Medicine

## 2024-03-11 MED ORDER — TIRZEPATIDE-WEIGHT MANAGEMENT 10 MG/0.5ML ~~LOC~~ SOLN: 10.0000 mg | SUBCUTANEOUS | 1 refills | Status: AC

## 2024-04-04 ENCOUNTER — Encounter: Admitting: Family Medicine
